# Patient Record
Sex: Male | Born: 1950 | Race: White | Hispanic: No | Smoking: Former smoker
Health system: Southern US, Community
[De-identification: ages and names within clinical notes are randomized; demographics above are authoritative.]

## PROBLEM LIST (undated history)

## (undated) DIAGNOSIS — K7030 Alcoholic cirrhosis of liver without ascites: Secondary | ICD-10-CM

## (undated) DIAGNOSIS — F1010 Alcohol abuse, uncomplicated: Secondary | ICD-10-CM

## (undated) DIAGNOSIS — I864 Gastric varices: Secondary | ICD-10-CM

## (undated) HISTORY — PX: OTHER SURGICAL HISTORY: SHX169

## (undated) SURGERY — COLONOSCOPY
Anesthesia: Monitor Anesthesia Care

---

## 2009-11-20 LAB — HM COLONOSCOPY: HM Colonoscopy: NEGATIVE

## 2011-09-30 ENCOUNTER — Emergency Department: Payer: Self-pay | Admitting: Emergency Medicine

## 2012-12-31 ENCOUNTER — Inpatient Hospital Stay: Payer: Self-pay | Admitting: Specialist

## 2012-12-31 DIAGNOSIS — R0602 Shortness of breath: Secondary | ICD-10-CM | POA: Diagnosis not present

## 2012-12-31 DIAGNOSIS — D72829 Elevated white blood cell count, unspecified: Secondary | ICD-10-CM | POA: Diagnosis not present

## 2012-12-31 DIAGNOSIS — J189 Pneumonia, unspecified organism: Secondary | ICD-10-CM | POA: Diagnosis not present

## 2012-12-31 DIAGNOSIS — F17200 Nicotine dependence, unspecified, uncomplicated: Secondary | ICD-10-CM | POA: Diagnosis not present

## 2012-12-31 DIAGNOSIS — K7460 Unspecified cirrhosis of liver: Secondary | ICD-10-CM | POA: Diagnosis not present

## 2012-12-31 DIAGNOSIS — R188 Other ascites: Secondary | ICD-10-CM | POA: Diagnosis not present

## 2012-12-31 DIAGNOSIS — K7030 Alcoholic cirrhosis of liver without ascites: Secondary | ICD-10-CM | POA: Diagnosis not present

## 2012-12-31 LAB — LIPASE, BLOOD: Lipase: 150 U/L (ref 73–393)

## 2012-12-31 LAB — CBC
HCT: 43.9 % (ref 40.0–52.0)
HGB: 14.6 g/dL (ref 13.0–18.0)
MCH: 34 pg (ref 26.0–34.0)
MCHC: 33.4 g/dL (ref 32.0–36.0)
Platelet: 233 10*3/uL (ref 150–440)
RBC: 4.31 10*6/uL — ABNORMAL LOW (ref 4.40–5.90)
WBC: 15.2 10*3/uL — ABNORMAL HIGH (ref 3.8–10.6)

## 2012-12-31 LAB — BASIC METABOLIC PANEL
Calcium, Total: 8.9 mg/dL (ref 8.5–10.1)
Creatinine: 1.05 mg/dL (ref 0.60–1.30)
EGFR (African American): >60
Glucose: 139 mg/dL — ABNORMAL HIGH (ref 65–99)
Osmolality: 277 (ref 275–301)
Sodium: 138 mmol/L (ref 136–145)

## 2012-12-31 LAB — HEPATIC FUNCTION PANEL A (ARMC)
Albumin: 2.9 g/dL — ABNORMAL LOW (ref 3.4–5.0)
Alkaline Phosphatase: 238 U/L — ABNORMAL HIGH (ref 50–136)
SGOT(AST): 116 U/L — ABNORMAL HIGH (ref 15–37)
SGPT (ALT): 52 U/L (ref 12–78)

## 2012-12-31 LAB — URINALYSIS, COMPLETE
Bilirubin,UR: NEGATIVE
Ketone: NEGATIVE
Leukocyte Esterase: NEGATIVE
Nitrite: NEGATIVE
Ph: 6 (ref 4.5–8.0)
Protein: NEGATIVE
Specific Gravity: 1.005 (ref 1.003–1.030)

## 2012-12-31 LAB — MAGNESIUM: Magnesium: 1.5 mg/dL — ABNORMAL LOW

## 2012-12-31 LAB — TROPONIN I: Troponin-I: <0.02 ng/mL

## 2012-12-31 LAB — PROTIME-INR: INR: 1.4

## 2013-01-01 DIAGNOSIS — K7030 Alcoholic cirrhosis of liver without ascites: Secondary | ICD-10-CM | POA: Diagnosis not present

## 2013-01-01 DIAGNOSIS — K709 Alcoholic liver disease, unspecified: Secondary | ICD-10-CM | POA: Diagnosis not present

## 2013-01-01 DIAGNOSIS — R188 Other ascites: Secondary | ICD-10-CM | POA: Diagnosis not present

## 2013-01-01 DIAGNOSIS — F17200 Nicotine dependence, unspecified, uncomplicated: Secondary | ICD-10-CM | POA: Diagnosis not present

## 2013-01-01 DIAGNOSIS — D72829 Elevated white blood cell count, unspecified: Secondary | ICD-10-CM | POA: Diagnosis not present

## 2013-01-01 LAB — APTT: Activated PTT: 33.4 secs (ref 23.6–35.9)

## 2013-01-01 LAB — PROTIME-INR
INR: 1.3
Prothrombin Time: 16.9 secs — ABNORMAL HIGH (ref 11.5–14.7)

## 2013-01-01 LAB — BODY FLUID CELL COUNT WITH DIFFERENTIAL
Eosinophil: 0 %
Lymphocytes: 50 %
Neutrophils: 17 %
Other Cells BF: 0 %

## 2013-01-01 LAB — LACTATE DEHYDROGENASE, PLEURAL OR PERITONEAL FLUID: LDH, Body Fluid: 57 U/L

## 2013-01-01 LAB — GLUCOSE, SEROUS FLUID: Glucose, Body Fluid: 122 mg/dL

## 2013-01-02 DIAGNOSIS — R188 Other ascites: Secondary | ICD-10-CM | POA: Diagnosis not present

## 2013-01-02 DIAGNOSIS — K7030 Alcoholic cirrhosis of liver without ascites: Secondary | ICD-10-CM | POA: Diagnosis not present

## 2013-01-02 DIAGNOSIS — F17200 Nicotine dependence, unspecified, uncomplicated: Secondary | ICD-10-CM | POA: Diagnosis not present

## 2013-01-02 DIAGNOSIS — K709 Alcoholic liver disease, unspecified: Secondary | ICD-10-CM | POA: Diagnosis not present

## 2013-01-02 DIAGNOSIS — D72829 Elevated white blood cell count, unspecified: Secondary | ICD-10-CM | POA: Diagnosis not present

## 2013-01-03 DIAGNOSIS — K709 Alcoholic liver disease, unspecified: Secondary | ICD-10-CM | POA: Diagnosis not present

## 2013-01-03 DIAGNOSIS — F17200 Nicotine dependence, unspecified, uncomplicated: Secondary | ICD-10-CM | POA: Diagnosis not present

## 2013-01-03 DIAGNOSIS — D72829 Elevated white blood cell count, unspecified: Secondary | ICD-10-CM | POA: Diagnosis not present

## 2013-01-03 DIAGNOSIS — R188 Other ascites: Secondary | ICD-10-CM | POA: Diagnosis not present

## 2013-01-03 DIAGNOSIS — K7030 Alcoholic cirrhosis of liver without ascites: Secondary | ICD-10-CM | POA: Diagnosis not present

## 2013-01-03 LAB — COMPREHENSIVE METABOLIC PANEL
Alkaline Phosphatase: 175 U/L — ABNORMAL HIGH (ref 50–136)
Anion Gap: 8 (ref 7–16)
BUN: 9 mg/dL (ref 7–18)
Bilirubin,Total: 2.1 mg/dL — ABNORMAL HIGH (ref 0.2–1.0)
Calcium, Total: 8.2 mg/dL — ABNORMAL LOW (ref 8.5–10.1)
Chloride: 104 mmol/L (ref 98–107)
Co2: 27 mmol/L (ref 21–32)
EGFR (African American): >60
Osmolality: 276 (ref 275–301)
Potassium: 3.2 mmol/L — ABNORMAL LOW (ref 3.5–5.1)
Sodium: 139 mmol/L (ref 136–145)
Total Protein: 6.9 g/dL (ref 6.4–8.2)

## 2013-01-03 LAB — AFP TUMOR MARKER: AFP-Tumor Marker: 3 ng/mL (ref 0.0–8.3)

## 2013-01-04 DIAGNOSIS — D72829 Elevated white blood cell count, unspecified: Secondary | ICD-10-CM | POA: Diagnosis not present

## 2013-01-04 DIAGNOSIS — F17200 Nicotine dependence, unspecified, uncomplicated: Secondary | ICD-10-CM | POA: Diagnosis not present

## 2013-01-04 DIAGNOSIS — K7030 Alcoholic cirrhosis of liver without ascites: Secondary | ICD-10-CM | POA: Diagnosis not present

## 2013-01-04 DIAGNOSIS — R188 Other ascites: Secondary | ICD-10-CM | POA: Diagnosis not present

## 2013-01-04 LAB — BASIC METABOLIC PANEL
BUN: 7 mg/dL (ref 7–18)
Calcium, Total: 8.1 mg/dL — ABNORMAL LOW (ref 8.5–10.1)
Co2: 29 mmol/L (ref 21–32)
Creatinine: 0.85 mg/dL (ref 0.60–1.30)
Osmolality: 275 (ref 275–301)
Potassium: 3.6 mmol/L (ref 3.5–5.1)
Sodium: 138 mmol/L (ref 136–145)

## 2013-01-04 LAB — MAGNESIUM: Magnesium: 1.6 mg/dL — ABNORMAL LOW

## 2013-01-05 LAB — BODY FLUID CULTURE

## 2013-01-06 LAB — CULTURE, BLOOD (SINGLE)

## 2013-04-10 ENCOUNTER — Ambulatory Visit: Payer: Self-pay | Admitting: Family Medicine

## 2013-04-10 DIAGNOSIS — M546 Pain in thoracic spine: Secondary | ICD-10-CM | POA: Diagnosis not present

## 2013-04-10 DIAGNOSIS — K709 Alcoholic liver disease, unspecified: Secondary | ICD-10-CM | POA: Diagnosis not present

## 2013-04-10 DIAGNOSIS — G8929 Other chronic pain: Secondary | ICD-10-CM | POA: Diagnosis not present

## 2013-04-10 DIAGNOSIS — M549 Dorsalgia, unspecified: Secondary | ICD-10-CM | POA: Diagnosis not present

## 2013-04-10 DIAGNOSIS — F17200 Nicotine dependence, unspecified, uncomplicated: Secondary | ICD-10-CM | POA: Diagnosis not present

## 2013-04-10 DIAGNOSIS — M5380 Other specified dorsopathies, site unspecified: Secondary | ICD-10-CM | POA: Diagnosis not present

## 2013-04-10 DIAGNOSIS — M79609 Pain in unspecified limb: Secondary | ICD-10-CM | POA: Diagnosis not present

## 2013-04-28 DIAGNOSIS — Z1211 Encounter for screening for malignant neoplasm of colon: Secondary | ICD-10-CM | POA: Diagnosis not present

## 2013-04-28 DIAGNOSIS — K7030 Alcoholic cirrhosis of liver without ascites: Secondary | ICD-10-CM | POA: Diagnosis not present

## 2013-05-13 DIAGNOSIS — K709 Alcoholic liver disease, unspecified: Secondary | ICD-10-CM | POA: Diagnosis not present

## 2013-05-13 DIAGNOSIS — M79609 Pain in unspecified limb: Secondary | ICD-10-CM | POA: Diagnosis not present

## 2013-05-13 DIAGNOSIS — M549 Dorsalgia, unspecified: Secondary | ICD-10-CM | POA: Diagnosis not present

## 2013-05-13 DIAGNOSIS — F17200 Nicotine dependence, unspecified, uncomplicated: Secondary | ICD-10-CM | POA: Diagnosis not present

## 2013-07-04 DIAGNOSIS — G8929 Other chronic pain: Secondary | ICD-10-CM | POA: Diagnosis not present

## 2013-07-04 DIAGNOSIS — M549 Dorsalgia, unspecified: Secondary | ICD-10-CM | POA: Diagnosis not present

## 2013-07-04 DIAGNOSIS — M79609 Pain in unspecified limb: Secondary | ICD-10-CM | POA: Diagnosis not present

## 2013-07-04 DIAGNOSIS — K709 Alcoholic liver disease, unspecified: Secondary | ICD-10-CM | POA: Diagnosis not present

## 2013-08-13 ENCOUNTER — Ambulatory Visit: Payer: Self-pay | Admitting: Pain Medicine

## 2013-08-13 DIAGNOSIS — J438 Other emphysema: Secondary | ICD-10-CM | POA: Diagnosis not present

## 2013-08-13 DIAGNOSIS — M533 Sacrococcygeal disorders, not elsewhere classified: Secondary | ICD-10-CM | POA: Diagnosis not present

## 2013-08-13 DIAGNOSIS — F1021 Alcohol dependence, in remission: Secondary | ICD-10-CM | POA: Diagnosis not present

## 2013-08-13 DIAGNOSIS — M47817 Spondylosis without myelopathy or radiculopathy, lumbosacral region: Secondary | ICD-10-CM | POA: Diagnosis not present

## 2013-08-13 DIAGNOSIS — IMO0002 Reserved for concepts with insufficient information to code with codable children: Secondary | ICD-10-CM | POA: Diagnosis not present

## 2013-08-13 DIAGNOSIS — Z6379 Other stressful life events affecting family and household: Secondary | ICD-10-CM | POA: Diagnosis not present

## 2013-08-13 DIAGNOSIS — F17200 Nicotine dependence, unspecified, uncomplicated: Secondary | ICD-10-CM | POA: Diagnosis not present

## 2013-08-13 DIAGNOSIS — M169 Osteoarthritis of hip, unspecified: Secondary | ICD-10-CM | POA: Diagnosis not present

## 2013-08-13 DIAGNOSIS — M67919 Unspecified disorder of synovium and tendon, unspecified shoulder: Secondary | ICD-10-CM | POA: Diagnosis not present

## 2013-08-13 DIAGNOSIS — Z8709 Personal history of other diseases of the respiratory system: Secondary | ICD-10-CM | POA: Diagnosis not present

## 2013-08-13 DIAGNOSIS — M19019 Primary osteoarthritis, unspecified shoulder: Secondary | ICD-10-CM | POA: Diagnosis not present

## 2013-08-13 DIAGNOSIS — G894 Chronic pain syndrome: Secondary | ICD-10-CM | POA: Diagnosis not present

## 2013-08-13 DIAGNOSIS — Z79899 Other long term (current) drug therapy: Secondary | ICD-10-CM | POA: Diagnosis not present

## 2013-08-22 DIAGNOSIS — Z23 Encounter for immunization: Secondary | ICD-10-CM | POA: Diagnosis not present

## 2013-08-25 ENCOUNTER — Other Ambulatory Visit: Payer: Self-pay

## 2013-08-25 ENCOUNTER — Ambulatory Visit: Payer: Self-pay | Admitting: Pain Medicine

## 2013-08-25 DIAGNOSIS — Z135 Encounter for screening for eye and ear disorders: Secondary | ICD-10-CM | POA: Diagnosis not present

## 2013-08-25 DIAGNOSIS — M533 Sacrococcygeal disorders, not elsewhere classified: Secondary | ICD-10-CM | POA: Diagnosis not present

## 2013-08-25 DIAGNOSIS — K709 Alcoholic liver disease, unspecified: Secondary | ICD-10-CM | POA: Diagnosis not present

## 2013-08-25 DIAGNOSIS — M545 Low back pain: Secondary | ICD-10-CM | POA: Diagnosis not present

## 2013-08-25 DIAGNOSIS — M47817 Spondylosis without myelopathy or radiculopathy, lumbosacral region: Secondary | ICD-10-CM | POA: Diagnosis not present

## 2013-08-25 DIAGNOSIS — M25559 Pain in unspecified hip: Secondary | ICD-10-CM | POA: Diagnosis not present

## 2013-08-25 LAB — COMPREHENSIVE METABOLIC PANEL
Albumin: 3.4 g/dL (ref 3.4–5.0)
Anion Gap: 3 — ABNORMAL LOW (ref 7–16)
Bilirubin,Total: 0.5 mg/dL (ref 0.2–1.0)
Calcium, Total: 9 mg/dL (ref 8.5–10.1)
Chloride: 109 mmol/L — ABNORMAL HIGH (ref 98–107)
EGFR (Non-African Amer.): >60
Osmolality: 280 (ref 275–301)
Sodium: 139 mmol/L (ref 136–145)
Total Protein: 7 g/dL (ref 6.4–8.2)

## 2013-08-27 ENCOUNTER — Ambulatory Visit: Payer: Self-pay | Admitting: Pain Medicine

## 2013-08-27 DIAGNOSIS — S43499A Other sprain of unspecified shoulder joint, initial encounter: Secondary | ICD-10-CM | POA: Diagnosis not present

## 2013-08-27 DIAGNOSIS — T148XXA Other injury of unspecified body region, initial encounter: Secondary | ICD-10-CM | POA: Diagnosis not present

## 2013-08-27 DIAGNOSIS — M25519 Pain in unspecified shoulder: Secondary | ICD-10-CM | POA: Diagnosis not present

## 2013-08-27 DIAGNOSIS — S46919A Strain of unspecified muscle, fascia and tendon at shoulder and upper arm level, unspecified arm, initial encounter: Secondary | ICD-10-CM | POA: Diagnosis not present

## 2013-08-27 DIAGNOSIS — S46819A Strain of other muscles, fascia and tendons at shoulder and upper arm level, unspecified arm, initial encounter: Secondary | ICD-10-CM | POA: Diagnosis not present

## 2013-08-27 DIAGNOSIS — M545 Low back pain, unspecified: Secondary | ICD-10-CM | POA: Diagnosis not present

## 2013-09-05 DIAGNOSIS — S43439A Superior glenoid labrum lesion of unspecified shoulder, initial encounter: Secondary | ICD-10-CM | POA: Diagnosis not present

## 2013-09-05 DIAGNOSIS — M66329 Spontaneous rupture of flexor tendons, unspecified upper arm: Secondary | ICD-10-CM | POA: Diagnosis not present

## 2014-07-11 ENCOUNTER — Emergency Department: Payer: Self-pay | Admitting: Emergency Medicine

## 2014-07-11 DIAGNOSIS — S199XXA Unspecified injury of neck, initial encounter: Secondary | ICD-10-CM | POA: Diagnosis not present

## 2014-07-11 DIAGNOSIS — M545 Low back pain, unspecified: Secondary | ICD-10-CM | POA: Diagnosis not present

## 2014-07-11 DIAGNOSIS — S0993XA Unspecified injury of face, initial encounter: Secondary | ICD-10-CM | POA: Diagnosis not present

## 2014-07-11 DIAGNOSIS — M542 Cervicalgia: Secondary | ICD-10-CM | POA: Diagnosis not present

## 2014-07-11 DIAGNOSIS — IMO0002 Reserved for concepts with insufficient information to code with codable children: Secondary | ICD-10-CM | POA: Diagnosis not present

## 2014-07-19 ENCOUNTER — Emergency Department: Payer: Self-pay | Admitting: Emergency Medicine

## 2014-07-19 DIAGNOSIS — S0003XA Contusion of scalp, initial encounter: Secondary | ICD-10-CM | POA: Diagnosis not present

## 2014-07-19 DIAGNOSIS — S59909A Unspecified injury of unspecified elbow, initial encounter: Secondary | ICD-10-CM | POA: Diagnosis not present

## 2014-07-19 DIAGNOSIS — Z23 Encounter for immunization: Secondary | ICD-10-CM | POA: Diagnosis not present

## 2014-07-19 DIAGNOSIS — S199XXA Unspecified injury of neck, initial encounter: Secondary | ICD-10-CM | POA: Diagnosis not present

## 2014-07-19 DIAGNOSIS — IMO0002 Reserved for concepts with insufficient information to code with codable children: Secondary | ICD-10-CM | POA: Diagnosis not present

## 2014-07-19 DIAGNOSIS — S0993XA Unspecified injury of face, initial encounter: Secondary | ICD-10-CM | POA: Diagnosis not present

## 2014-07-19 DIAGNOSIS — S0990XA Unspecified injury of head, initial encounter: Secondary | ICD-10-CM | POA: Diagnosis not present

## 2014-07-19 DIAGNOSIS — R404 Transient alteration of awareness: Secondary | ICD-10-CM | POA: Diagnosis not present

## 2014-07-19 DIAGNOSIS — F17200 Nicotine dependence, unspecified, uncomplicated: Secondary | ICD-10-CM | POA: Diagnosis not present

## 2014-07-19 DIAGNOSIS — S1093XA Contusion of unspecified part of neck, initial encounter: Secondary | ICD-10-CM | POA: Diagnosis not present

## 2014-12-29 DIAGNOSIS — Z720 Tobacco use: Secondary | ICD-10-CM | POA: Diagnosis not present

## 2014-12-29 DIAGNOSIS — G8929 Other chronic pain: Secondary | ICD-10-CM | POA: Diagnosis not present

## 2014-12-29 DIAGNOSIS — M79604 Pain in right leg: Secondary | ICD-10-CM | POA: Diagnosis not present

## 2014-12-29 DIAGNOSIS — M25559 Pain in unspecified hip: Secondary | ICD-10-CM | POA: Diagnosis not present

## 2014-12-29 DIAGNOSIS — M549 Dorsalgia, unspecified: Secondary | ICD-10-CM | POA: Diagnosis not present

## 2014-12-29 DIAGNOSIS — R011 Cardiac murmur, unspecified: Secondary | ICD-10-CM | POA: Diagnosis not present

## 2014-12-29 DIAGNOSIS — K709 Alcoholic liver disease, unspecified: Secondary | ICD-10-CM | POA: Diagnosis not present

## 2014-12-29 DIAGNOSIS — Z23 Encounter for immunization: Secondary | ICD-10-CM | POA: Diagnosis not present

## 2014-12-29 DIAGNOSIS — Z0000 Encounter for general adult medical examination without abnormal findings: Secondary | ICD-10-CM | POA: Diagnosis not present

## 2015-01-04 ENCOUNTER — Telehealth: Payer: Self-pay | Admitting: *Deleted

## 2015-01-04 NOTE — Telephone Encounter (Signed)
lmov to for pt to set up apt, refferal from dr Luan Pulling.notes on file,hm

## 2015-02-09 DIAGNOSIS — N402 Nodular prostate without lower urinary tract symptoms: Secondary | ICD-10-CM | POA: Diagnosis not present

## 2015-02-09 DIAGNOSIS — K709 Alcoholic liver disease, unspecified: Secondary | ICD-10-CM | POA: Diagnosis not present

## 2015-02-09 DIAGNOSIS — L308 Other specified dermatitis: Secondary | ICD-10-CM | POA: Diagnosis not present

## 2015-02-09 DIAGNOSIS — R739 Hyperglycemia, unspecified: Secondary | ICD-10-CM | POA: Diagnosis not present

## 2015-02-10 LAB — LIPID PANEL
CHOLESTEROL: 207 mg/dL — AB (ref 0–200)
HDL: 104 mg/dL — AB (ref 35–70)
LDL Cholesterol: 93 mg/dL
Triglycerides: 52 mg/dL (ref 40–160)

## 2015-02-10 LAB — BASIC METABOLIC PANEL
BUN: 13 mg/dL (ref 4–21)
Creatinine: 1 mg/dL (ref <=1.3)
GLUCOSE: 117 mg/dL
Potassium: 3.7 mmol/L (ref 3.4–5.3)
Sodium: 141 mmol/L (ref 137–147)

## 2015-02-10 LAB — PSA: PSA: 4.4

## 2015-02-10 LAB — HEPATIC FUNCTION PANEL
ALK PHOS: 208 U/L — AB (ref 25–125)
ALT: 46 U/L — AB (ref 10–40)
AST: 88 U/L — AB (ref 14–40)
BILIRUBIN, TOTAL: 1.5 mg/dL

## 2015-02-10 LAB — HEMOGLOBIN A1C: Hgb A1c MFr Bld: 5.3 % (ref 4.0–6.0)

## 2015-02-10 LAB — CBC AND DIFFERENTIAL
HEMATOCRIT: 42 % (ref 41–53)
Hemoglobin: 14.4 g/dL (ref 13.5–17.5)
Neutrophils Absolute: 5 /uL
PLATELETS: 143 10*3/uL — AB (ref 150–399)
WBC: 7.8 10*3/mL

## 2015-02-12 DIAGNOSIS — M79604 Pain in right leg: Secondary | ICD-10-CM | POA: Diagnosis not present

## 2015-02-12 DIAGNOSIS — R972 Elevated prostate specific antigen [PSA]: Secondary | ICD-10-CM | POA: Diagnosis not present

## 2015-02-12 DIAGNOSIS — M549 Dorsalgia, unspecified: Secondary | ICD-10-CM | POA: Diagnosis not present

## 2015-02-12 DIAGNOSIS — I10 Essential (primary) hypertension: Secondary | ICD-10-CM | POA: Diagnosis not present

## 2015-02-12 DIAGNOSIS — K709 Alcoholic liver disease, unspecified: Secondary | ICD-10-CM | POA: Diagnosis not present

## 2015-02-12 DIAGNOSIS — G8929 Other chronic pain: Secondary | ICD-10-CM | POA: Diagnosis not present

## 2015-03-12 NOTE — Consult Note (Signed)
Chief Complaint:  Subjective/Chief Complaint Overall better. Still draining some from paracentesis site.   VITAL SIGNS/ANCILLARY NOTES: **Vital Signs.:   14-Feb-14 05:38  Vital Signs Type Routine  Temperature Temperature (F) 98.5  Celsius 36.9  Temperature Source oral  Pulse Pulse 79  Respirations Respirations 17  Systolic BP Systolic BP 403  Diastolic BP (mmHg) Diastolic BP (mmHg) 71  Mean BP 88  Pulse Ox % Pulse Ox % 96  Pulse Ox Activity Level  At rest  Oxygen Delivery 2L   Brief Assessment:  Additional Physical Exam Abdomen is distended but otherwise benign.   Lab Results:  Hepatic:  14-Feb-14 04:25   Bilirubin, Total  2.1  Alkaline Phosphatase  175  SGPT (ALT) 41  SGOT (AST)  84  Total Protein, Serum 6.9  Albumin, Serum  2.4  Routine Chem:  14-Feb-14 04:25   Glucose, Serum 86  BUN 9  Creatinine (comp) 0.85  Sodium, Serum 139  Potassium, Serum  3.2  Chloride, Serum 104  CO2, Serum 27  Calcium (Total), Serum  8.2  Osmolality (calc) 276  eGFR (African American) >60  eGFR (Non-African American) >60 (eGFR values <17m/min/1.73 m2 may be an indication of chronic kidney disease (CKD). Calculated eGFR is useful in patients with stable renal function. The eGFR calculation will not be reliable in acutely ill patients when serum creatinine is changing rapidly. It is not useful in  patients on dialysis. The eGFR calculation may not be applicable to patients at the low and high extremes of body sizes, pregnant women, and vegetarians.)  Anion Gap 8   Assessment/Plan:  Assessment/Plan:  Assessment Cirrhosis of liver with tense ascites. Hepatitis panel and AFP are negative.   Plan Repeat paracentesis today. May go home tomorrow on PO lasix and aldactone. Follow up with me in 2 weeks (order written). Will sign off. Please call me if needed. Thanks.   Electronic Signatures: IJill Side(MD)  (Signed 14-Feb-14 11:05)  Authored: Chief Complaint, VITAL  SIGNS/ANCILLARY NOTES, Brief Assessment, Lab Results, Assessment/Plan   Last Updated: 14-Feb-14 11:05 by IJill Side(MD)

## 2015-03-12 NOTE — Consult Note (Signed)
PATIENT NAME:  Craig Maldonado, Craig Maldonado MR#:  [id number] DATE OF BIRTH:  10-04-1951  DATE OF CONSULTATION:  01/01/2013  REFERRING PHYSICIAN:  Fritzi Mandes, MD CONSULTING PHYSICIAN:  Jill Side, MD  PRIMARY CARE PHYSICIAN: Benita Stabile, MD  REASON FOR CONSULTATION: Tense ascites.   HISTORY OF PRESENT ILLNESS: This is a 64 year old Caucasian male with history of chronic acid reflux and chronic back pain. According to the patient, for the last 10 days he has been noticing decreasing urination and increasing scrotal swelling as well as abdominal distention. He came to the Emergency Room yesterday with significant abdominal distention and discomfort because of increasing abdominal girth.  CT scan of the abdomen and pelvis showed moderate amount of ascites and an abnormal-looking liver consistent with cirrhosis of liver. He denies any history of known liver disease or hepatitis in the past. The patient is a drinker and drinks on a daily basis. He denies any nausea or vomiting, denies any diarrhea or constipation. According to him, he has never had ascites, GI bleed, mental confusion or any other signs of liver disease in the past.   PAST MEDICAL HISTORY: As above.   PAST SURGICAL HISTORY: Right leg surgery for compound fracture of tibia and fibula.   ALLERGIES: No known drug allergies.   HOME MEDICATIONS: None.  FAMILY HISTORY: Positive for hypertension.   SOCIAL HISTORY: He drinks about a pint of alcohol mixed with vodka in orange juice every day. He smokes about 1/2 pack of cigarettes a day.   REVIEW OF SYSTEMS: Negative except for what is mentioned in the history of present illness.   PHYSICAL EXAMINATION: GENERAL: Obese male. Does not appear to be in any acute distress.  VITAL SIGNS: Temperature is 97.8, pulse 79, respirations 18 to 20 and blood pressure 112/79.  HEENT: Examination is unremarkable. Clinically appears to be mildly jaundiced.  NECK: Veins are flat.  PULMONARY: Lungs are clear to  auscultation bilaterally with fair air entry and no added sounds.  CARDIOVASCULAR: Regular rate and rhythm. No gallops or murmur.  ABDOMEN: Distended and tense abdomen. Positive shifting dullness. Liver and spleen are not palpable.  EXTREMITIES: Examination did not show any significant edema.  NEUROLOGIC: Examination appears to be unremarkable.   LABORATORY DATA: INR is 1.4. White cell count is 15,000, hemoglobin 14.6, hematocrit 43.9 and platelet count of 233. Serum lipase is 150. Troponin is less than 0.02. Electrolytes: BUN and creatinine normal. Total bilirubin is 3.8, direct bilirubin is 2, alkaline phosphatase is 238, ALT is normal at 52, AST is elevated at 116 and serum albumin is low at 2.9.   RADIOLOGIC DATA: CT scan of the abdomen as above.   ASSESSMENT AND PLAN: The patient is with what appears to be portal hypertension with ascites. The most likely underlying etiology is cirrhosis of liver although alcoholic hepatitis with secondary portal hypertension without significant cirrhosis remains a possibility. I agree with ultrasound-guided paracentesis for symptomatic improvement. Agree with Lasix and Aldactone. Sodium restricted diet. I will obtain acute hepatitis panel as well as alpha-fetoprotein. Will also obtain an ultrasound with Doppler to rule out any vascular pathology. Further recommendations to follow.  ____________________________ Jill Side, MD si:sb D: 01/01/2013 09:54:43 ET T: 01/01/2013 10:10:06 ET JOB#: [id number]  cc: Jill Side, MD, <Dictator> Leona Carry. Hall Busing, MD Jill Side MD ELECTRONICALLY SIGNED 01/03/2013 11:10

## 2015-03-12 NOTE — H&P (Signed)
PATIENT NAMEKIE, Craig Maldonado MR#:  [id number] DATE OF BIRTH:  03-May-1951  DATE OF ADMISSION:  12/31/2012  PRIMARY CARE PHYSICIAN:  Dr. Benita Stabile.  CHIEF COMPLAINT: Abdominal distention for about a week to 10 days.   HISTORY OF PRESENT ILLNESS:   The patient is a pleasant 64 year old Caucasian gentleman with past medical history of acid reflux and back pain. Comes to the Emergency Room accompanied by his wife with complaints of abdominal distention over the past week to 10 days. The patient said he had flulike symptoms about 3 to 4 weeks ago; thereafter, he has had diarrheal stools, had episodes of 6 to 10 times a day for the last 10 days. Denies any fever, nausea or vomiting. Thereafter, he started noticing abdominal distension to the point it became very tense, tight and is unable to get around without difficulty and shortness of breath. Denies any productive cough.   In the Emergency Room, the patient was found to have tense ascites. CT of the abdomen showed cirrhosis of liver with abnormal LFTs and history of alcohol abuse suggestive of likely new onset cirrhosis of liver, alcohol abuse. He is being admitted for further evaluation and management.   PAST MEDICAL HISTORY:   1.  GERD/acid reflux.  2.  Back pain.   PAST SURGICAL HISTORY:  Right leg surgery for compound fracture tibia-fibula x 5.   ALLERGIES TO MEDICATIONS:  None.  HOME MEDICATIONS: None.    FAMILY HISTORY: Positive for hypertension.   SOCIAL HISTORY: Lives at home with wife. Drinks about a pint of alcohol mixed with vodka with orange juice every day for the last 6 to 7 months. The patient also smokes about 1/2 pack a day of cigarettes for the last 40 years. Denies any other drug use.     REVIEW OF SYSTEMS:   CONSTITUTIONAL: Positive for fatigue, weakness.  EYES: No blurred or double vision or cataracts.  EAR, NOSE, THROAT: No tinnitus, ear pain, hearing loss or difficulty swallowing.  RESPIRATORY: No cough or wheeze.   Positive for shortness of breath.  CARDIOVASCULAR: No chest pain, orthopnea, edema or hypertension.  GASTROINTESTINAL: No nausea, vomiting, diarrhea. Positive for abdominal distention. Positive for GERD. Denies melena. Positive for diarrhea.  GENITOURINARY: No dysuria or hematuria.  ENDOCRINE: No polyuria or nocturia.  HEMATOLOGY: No anemia or easy bruising.  SKIN: No acne or rash.  MUSCULOSKELETAL: Positive for back pain.  NEUROLOGIC:  No CVA or TIAs.  PSYCHIATRIC: No anxiety or depression. All other systems reviewed and negative.   PHYSICAL EXAMINATION: GENERAL: The patient is awake, alert, oriented x 3, not in acute distress.  VITAL SIGNS: Afebrile. Pulse is 96, respirations 22, blood pressure 121/84, sats are 94% on 2 liters.  HEENT: Atraumatic, normocephalic. Pupils: ERRLA. EOM intact. Oral mucosa is dry.  NECK: Supple. No JVD. No carotid bruit.  RESPIRATORY: Decreased breath sounds in the bases. No respiratory distress, wheezing, crackles or rhonchi heard. No use of accessory muscles.  CARDIOVASCULAR: Both the heart sounds are normal, tachycardia present. No murmur heard. PMI not lateralized. Chest nontender.  EXTREMITIES: Good pedal pulses, good femoral pulses, 1+ pitting edema at the ankle joint. ABDOMEN:  Obese, distended with tense ascites, fluid thrill present. Organomegaly not appreciated secondary to tense ascites.  NEUROLOGIC: Grossly intact cranial nerves II through XII. No motor or sensory deficits.  PSYCHIATRIC:  The patient is awake, alert, oriented x 3. No tremors or anxiety noted.  ECHOCARDIOGRAM:  Shows sinus tachycardia.   LABORATORY DATA:  Cardiac enzymes:  First set negative. Magnesium is 1.5. Lipase is 150. Albumin 2.9. Alk phos is 238, SGOT is 116, total bilirubin is 2, direct total bilirubin is 3.8. Glucose is 139. The rest of the chemistry is normal.   RADIOGRAPHIC STUDIES:  Chest x-ray: Atelectasis and/or pneumonia right lung base. CT of the abdomen and pelvis  shows findings suggestive of possible underlying cirrhosis with significant abnormal appearance of liver without definite masses. There is severely heterogenous pattern, attenuation and enhancement, especially in the upper right lobe. There is moderate large volume of ascites, right lung base atelectasis. Infiltrate is felt to be less likely. The urinary bladder wall is thickened. Correlate with urinalysis.   URINALYSIS:  Negative for urinary tract infection.   ASSESSMENT AND PLAN:  A 64 year old patient with history of acid reflux and back pain, comes in with:  1.  Progressive increasing abdominal distention, appears to be due to tense ascites in the setting of cirrhosis, likely alcohol induced. The patient is going to be admitted on surgical floor. We will give 2 gram sodium diet, start the patient on Lasix and spironolactone.  We will give vitamin K intramuscular x 3 doses 10 mg, and have interventional radiology do ultrasound-guided paracentesis. Send fluid for pH, amylase, LDH, protein, cytology and differential. Dr. Dionne Milo will see the patient. The case was discussed with him. The patient was advised on alcohol abstinence.  2.  Leukocytosis, appears reactive. Chest x-ray shows there is a possible atelectasis versus infiltrate right lower lobe. However, the patient is asymptomatic. He has some shortness of breath, which is likely I am assuming because of his COPD due to smoking and large volume ascites pushing his diaphragm up and causing subjective shortness of breath. I will hold off on any antibiotics at this time. If the patient does not improve, then consider a round of antibiotics.  3.  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 We will continue to monitor counts.  4.  Hypomagnesemia. We will replace with oral magnesium.  5.  Deep vein thrombosis prophylaxis. The patient already has elevated PT/INR. I will give SCDs and  TEDs.  6.  Chronic alcohol abuse. The patient is recommended on abstinence. He is agreeable to it. We will give multivitamin, folate and thiamine. We will give IV Ativan for possible withdrawal.  7.  Chronic tobacco abuse. The patient is also advised on smoking cessation. About 3 minutes spent in counseling smoking cessation.   Further workup according to the patient's clinical course. Hospital admission plan was discussed   with the patient and the patient's wife, who is agreeable to it.   TIME SPENT: 50 minutes.   ____________________________ Hart Rochester Posey Pronto, MD sap:dm D: 12/31/2012 19:02:53 ET T: 12/31/2012 20:14:44 ET JOB#: [id number]  cc: Lurena Naeve A. Posey Pronto, MD, <Dictator> Leona Carry. Hall Busing, MD Ilda Basset MD ELECTRONICALLY SIGNED 12/31/2012 21:15

## 2015-03-12 NOTE — Discharge Summary (Signed)
PATIENT NAME:  Craig Maldonado, Craig Maldonado MR#:  [id number] DATE OF BIRTH:  1951/10/26  DATE OF ADMISSION:  12/31/2012 DATE OF DISCHARGE:  01/04/2013  For a detailed note, please take a look at the history and physical done on admission by Dr. Fritzi Mandes.   DIAGNOSES AT DISCHARGE: 1.  Acute abdominal ascites secondary to chronic liver disease.  2.  Chronic liver disease secondary to alcohol abuse.  3.  Chronic obstructive pulmonary disease.  4.  Tobacco abuse.   DIET:  The patient is being discharged on a low-sodium diet.   ACTIVITY:  As tolerated.   FOLLOW-UP:  With Dr. Dionne Milo in the next 1 to 2 weeks.   DISCHARGE MEDICATIONS:  Aldactone 50 mg daily, Lasix 40 mg daily, magnesium oxide 400 mg daily and tramadol 50 mg 4 times a day as needed for pain.   CONSULTANTS DURING THE HOSPITAL COURSE:  Dr. Dionne Milo from gastroenterology.   PERTINENT STUDIES DONE DURING THE HOSPITAL COURSE:  CT scan of the abdomen and pelvis done with contrast on admission showing findings suggestive of possible underlying cirrhosis with significant abnormal appearance of the liver without definite mass.  Moderate to large volume of ascites.  A chest x-ray done showing atelectasis and/or pneumonia.   An ultrasound-guided paracentesis done x 2 with removal of a total of 6 liters of fluid, alpha-fetoprotein level which is still pending.  An acute hepatitis profile, which was negative.   HOSPITAL COURSE:  This is a 64 year old male with medical problems as mentioned above, presented to the hospital on 12/31/2012 secondary to abdominal distention.  1.  Acute abdominal ascites.  This was likely secondary to chronic liver disease and alcohol abuse.  The patient's CT scan of the abdomen and pelvis confirmed cirrhosis.  He has a history of heavy alcohol abuse.  He has never had abdominal ascites before and had progressively come around in the past 10 days.  The patient underwent an ultrasound-guided paracentesis twice while in the  hospital, had 6 liters of fluid removed.  His fluid studies were consistent with noninfectious fluid with no evidence of any spontaneous bacterial peritonitis.  The patient's distention and shortness of breath and pain has significantly improved since then.  He is presently being discharged on diuretics as mentioned with close follow-up with GI as an outpatient.  2.  Shortness of breath.  Initially there was some thought that patient may have had a pneumonia.  And therefore he was started on antibiotics, but he remained afebrile.  His x-ray did not show any evidence of pneumonia, but more atelectasis.  The likely cause of patient's shortness of breath was probably due to underlying COPD complicated with significant abdominal ascites pushing up on his diaphragm.  After having large volume paracentesis the patient's shortness of breath has significantly improved.  He is currently afebrile.  His white cell count is normal and therefore he is being discharged on no antibiotics.  He is also strongly advised to refrain from smoking as mentioned.  3.  Acquired coagulopathy.  This is secondary to chronic liver disease.  This can further be followed.  He had no evidence of acute bleeding while in the hospital.  4.  Hypomagnesemia.  He is apparently presently being discharged on by mouth magnesium supplements.  5.  Tobacco abuse.  The patient was maintained on nicotine patch while in the hospital and strongly advised to refrain from smoking.   CODE STATUS:  THE PATIENT IS A FULL CODE.   TIME SPENT:  40  minutes.     ____________________________ Belia Heman. Verdell Carmine, MD vjs:ea D: 01/04/2013 14:32:00 ET T: 01/05/2013 06:43:09 ET JOB#: [id number]  cc: Belia Heman. Verdell Carmine, MD, <Dictator> Dr. Marton Redwood MD ELECTRONICALLY SIGNED 01/05/2013 20:55

## 2015-03-12 NOTE — Consult Note (Signed)
Chief Complaint:  Subjective/Chief Complaint Feels better after paracentesis. Fiar urine output.   VITAL SIGNS/ANCILLARY NOTES: **Vital Signs.:   13-Feb-14 05:09  Vital Signs Type Routine  Temperature Temperature (F) 98.1  Celsius 36.7  Temperature Source oral  Pulse Pulse 80  Respirations Respirations 17  Systolic BP Systolic BP 510  Diastolic BP (mmHg) Diastolic BP (mmHg) 74  Mean BP 87  Pulse Ox % Pulse Ox % 96  Pulse Ox Activity Level  At rest  Oxygen Delivery 2L   Brief Assessment:  Additional Physical Exam Abdomen is stikll distended but less than before.   Lab Results: BF Analysis:  12-Feb-14 11:19   Protein, BF 2.3  Body Fluid Source ABDOMINAL FLUID  Result(s) reported on 01 Jan 2013 at 01:12PM.  Albumin, Body Fluid 1.1 (Result(s) reported on 01 Jan 2013 at 01:12PM.)  Glucose, BF 122 (Result(s) reported on 01 Jan 2013 at 01:12PM.)  LDH, BF 57 (Result(s) reported on 01 Jan 2013 at 01:12PM.)  Body Fluid Source (CC) PARACENTESIS FLUID  Color - (BF) LIGHT YELLOW  Clarity (BF) CLEAR  NCC  (nucleated cell count) 365  Neutrophils  (BF) 17  Lymphocytes  (BF) 50  Monocytes/Macrophages  (BF) 33  Eosinophil  (BF) 0  Basophil  (BF) 0  Other Cells  (BF) 0 (Result(s) reported on 01 Jan 2013 at 01:33PM.)  Oncology:  12-Feb-14 14:22   AFP, Tumor Marker (Serial Monitoring) 3.0 ([facility] methodology            [facility]            No: [id number]           [address], [place], [place] [address]           Lindon Romp, MD         [phone] Result(s) reported on 02 Jan 2013 at 06:47AM.)  Routine Micro:  12-Feb-14 11:19   Micro Text Report BODY FLUID CULTURE   COMMENT                   NO GROWTH IN 18-24 HOURS   GRAM STAIN                MANY WHITE BLOOD CELLS   GRAM STAIN                NO ORGANISMS SEEN   GRAM STAIN                GROSSLY BLOODY   ANTIBIOTIC                       Specimen Source ASCITES FLUID  Culture Comment NO GROWTH IN  18-24 HOURS  Gram Stain 1 MANY WHITE BLOOD CELLS  Gram Stain 2 NO ORGANISMS SEEN  Gram Stain 3 GROSSLY BLOODY  Result(s) reported on 02 Jan 2013 at 10:55AM.  General Ref:  12-Feb-14 14:22   Acute Hepatitis Profile ========== TEST NAME ==========  ========= RESULTS =========  = REFERENCE RANGE =  HEPATITIS PANEL, ACUTE   Routine Coag:  12-Feb-14 04:17   Activated PTT (APTT) 33.4 (A HCT value >55% may artifactually increase the APTT. In one study, the increase was an average of 19%. Reference: "Effect on Routine and Special Coagulation Testing Values of Citrate Anticoagulant Adjustment in Patients with High HCT Values." American Journal of Clinical Pathology 2006;126:400-405.)  Prothrombin  16.9  INR 1.3 (INR reference interval applies to patients on anticoagulant therapy. A single INR therapeutic range  for coumarins is not optimal for all indications; however, the suggested range for most indications is 2.0 - 3.0. Exceptions to the INR Reference Range may include: Prosthetic heart valves, acute myocardial infarction, prevention of myocardial infarction, and combinations of aspirin and anticoagulant. The need for a higher or lower target INR must be assessed individually. Reference: The Pharmacology and Management of the Vitamin K  antagonists: the seventh ACCP Conference on Antithrombotic and Thrombolytic Therapy. OEUMP.5361 Sept:126 (3suppl): [id number]. A HCT value >55% may artifactually increase the PT.  In one study,  the increase was an average of 25%. Reference:  "Effect on Routine and Special Coagulation Testing Values of Citrate Anticoagulant Adjustment in Patients with High HCT Values." American Journal of Clinical Pathology 2006;126:400-405.)   Radiology Results: [place]:    12-Feb-14 11:08, US Abdomen Limited Survey  US Abdomen Limited Survey   REASON FOR EXAM:    portal HTN  COMMENTS:   Body Site: Right Upper Quad    PROCEDURE: [place]  - US ABDOMEN LIMITED SURVEY  - Jan 01 2013 11:08AM     RESULT:  Limited right upper quadrant abdominal sonogram is performed.   The pancreas could not be visualized. The liver shows increased   echotexture and is small and nodular consistent with underlying   cirrhosis. The a hepatic vein is visualized and may represent the right   hepatic vein. This appears to be unremarkable. Hepatic arterial waveform   appears normal. The portal vein was not visualized and therefore portal   venous flow could not be assessed. Gallbladder wall is thickened to 3.9   mm. There is a negative sonographic Murphy's sign. The common bile duct   could not be visualized.  IMPRESSION:  The portal venous flow could not be evaluated as the main   portal vein could not be visualized. The common bile duct was not   visualized. Ascites is present. Changes are present cirrhosis with   increased echotexture, decreased size and nodular appearance of the liver   being demonstrated. There is hepatic arterial flow with a normal waveform   documented. Hepatic venous flow appears to be present and what is likely   the right hepatic vein.    Dictation Site: 2    Addendum: After the patient underwent paracentesis additional images were   performed to attempt to better visualize the portal vein. The additional   images show visualization of flow in the portal vein toward the liver but   the spectral Doppler is nondiagnostic.    Verified By: Sundra Aland, M.D., MD   Assessment/Plan:  Assessment/Plan:  Assessment Cirrhosis most likely secondary to ETOH. [place] with patent vasculature. Tense ascites s/p 2000 ml removal yesterday. ETOH.   Plan Continue sodium restrictive diet and diuretics. Will follow.   Electronic Signatures: Jill Side (MD)  (Signed 13-Feb-14 14:19)  Authored: Chief Complaint, VITAL SIGNS/ANCILLARY NOTES, Brief Assessment, Lab Results, Radiology Results, Assessment/Plan   Last Updated: 13-Feb-14 14:19 by Jill Side  (MD)

## 2015-03-12 NOTE — Consult Note (Signed)
Brief Consult Note: Comments: Patient seen. Full consult dictated. Agree with current management. Additional tests ordered. Further recommendations to follow.  Electronic Signatures: Jill Side (MD)  (Signed 12-Feb-14 09:58)  Authored: Brief Consult Note   Last Updated: 12-Feb-14 09:58 by Jill Side (MD)

## 2015-04-19 DIAGNOSIS — F17210 Nicotine dependence, cigarettes, uncomplicated: Secondary | ICD-10-CM | POA: Insufficient documentation

## 2015-04-19 DIAGNOSIS — L308 Other specified dermatitis: Secondary | ICD-10-CM | POA: Insufficient documentation

## 2015-04-19 DIAGNOSIS — I10 Essential (primary) hypertension: Secondary | ICD-10-CM | POA: Insufficient documentation

## 2015-04-19 DIAGNOSIS — M79606 Pain in leg, unspecified: Secondary | ICD-10-CM | POA: Insufficient documentation

## 2015-04-19 DIAGNOSIS — N62 Hypertrophy of breast: Secondary | ICD-10-CM | POA: Insufficient documentation

## 2015-04-19 DIAGNOSIS — M25559 Pain in unspecified hip: Secondary | ICD-10-CM | POA: Insufficient documentation

## 2015-04-19 DIAGNOSIS — R972 Elevated prostate specific antigen [PSA]: Secondary | ICD-10-CM | POA: Insufficient documentation

## 2015-04-19 DIAGNOSIS — R739 Hyperglycemia, unspecified: Secondary | ICD-10-CM | POA: Insufficient documentation

## 2015-04-19 DIAGNOSIS — M549 Dorsalgia, unspecified: Secondary | ICD-10-CM

## 2015-04-19 DIAGNOSIS — G8929 Other chronic pain: Secondary | ICD-10-CM | POA: Insufficient documentation

## 2015-04-19 DIAGNOSIS — Z1331 Encounter for screening for depression: Secondary | ICD-10-CM | POA: Insufficient documentation

## 2015-04-19 DIAGNOSIS — R011 Cardiac murmur, unspecified: Secondary | ICD-10-CM | POA: Insufficient documentation

## 2015-04-19 DIAGNOSIS — N400 Enlarged prostate without lower urinary tract symptoms: Secondary | ICD-10-CM | POA: Insufficient documentation

## 2015-04-19 DIAGNOSIS — K709 Alcoholic liver disease, unspecified: Secondary | ICD-10-CM | POA: Insufficient documentation

## 2015-04-19 DIAGNOSIS — Z7189 Other specified counseling: Secondary | ICD-10-CM | POA: Insufficient documentation

## 2015-04-19 DIAGNOSIS — Z720 Tobacco use: Secondary | ICD-10-CM | POA: Insufficient documentation

## 2015-04-27 ENCOUNTER — Ambulatory Visit: Payer: Medicare Other | Admitting: Family Medicine

## 2015-09-24 ENCOUNTER — Other Ambulatory Visit: Payer: Self-pay | Admitting: Family Medicine

## 2015-09-24 NOTE — Telephone Encounter (Signed)
Patient requesting refill, last office visit 02/12/15. Patient was to f/u in 1 month.

## 2016-02-02 ENCOUNTER — Other Ambulatory Visit: Payer: Self-pay | Admitting: Family Medicine

## 2016-02-11 ENCOUNTER — Inpatient Hospital Stay: Payer: Medicare Other | Admitting: Anesthesiology

## 2016-02-11 ENCOUNTER — Emergency Department: Payer: Medicare Other

## 2016-02-11 ENCOUNTER — Inpatient Hospital Stay
Admission: EM | Admit: 2016-02-11 | Discharge: 2016-02-22 | DRG: 357 | Disposition: A | Discharge status: Home / Self Care | Payer: Medicare Other | Attending: Internal Medicine | Admitting: Internal Medicine

## 2016-02-11 ENCOUNTER — Inpatient Hospital Stay: Payer: Medicare Other

## 2016-02-11 ENCOUNTER — Encounter
Admission: EM | Disposition: A | Discharge status: Home / Self Care | Payer: Self-pay | Source: Home / Self Care | Attending: Internal Medicine

## 2016-02-11 DIAGNOSIS — R011 Cardiac murmur, unspecified: Secondary | ICD-10-CM | POA: Diagnosis present

## 2016-02-11 DIAGNOSIS — K625 Hemorrhage of anus and rectum: Secondary | ICD-10-CM | POA: Insufficient documentation

## 2016-02-11 DIAGNOSIS — K7030 Alcoholic cirrhosis of liver without ascites: Secondary | ICD-10-CM | POA: Diagnosis not present

## 2016-02-11 DIAGNOSIS — R55 Syncope and collapse: Secondary | ICD-10-CM | POA: Diagnosis present

## 2016-02-11 DIAGNOSIS — K921 Melena: Secondary | ICD-10-CM | POA: Insufficient documentation

## 2016-02-11 DIAGNOSIS — I8500 Esophageal varices without bleeding: Secondary | ICD-10-CM | POA: Diagnosis not present

## 2016-02-11 DIAGNOSIS — S3991XA Unspecified injury of abdomen, initial encounter: Secondary | ICD-10-CM | POA: Diagnosis not present

## 2016-02-11 DIAGNOSIS — K5790 Diverticulosis of intestine, part unspecified, without perforation or abscess without bleeding: Secondary | ICD-10-CM | POA: Diagnosis not present

## 2016-02-11 DIAGNOSIS — S46011A Strain of muscle(s) and tendon(s) of the rotator cuff of right shoulder, initial encounter: Secondary | ICD-10-CM | POA: Diagnosis present

## 2016-02-11 DIAGNOSIS — M75100 Unspecified rotator cuff tear or rupture of unspecified shoulder, not specified as traumatic: Secondary | ICD-10-CM

## 2016-02-11 DIAGNOSIS — W1839XA Other fall on same level, initial encounter: Secondary | ICD-10-CM | POA: Diagnosis present

## 2016-02-11 DIAGNOSIS — D72829 Elevated white blood cell count, unspecified: Secondary | ICD-10-CM

## 2016-02-11 DIAGNOSIS — R52 Pain, unspecified: Secondary | ICD-10-CM

## 2016-02-11 DIAGNOSIS — K3189 Other diseases of stomach and duodenum: Secondary | ICD-10-CM | POA: Diagnosis present

## 2016-02-11 DIAGNOSIS — D62 Acute posthemorrhagic anemia: Secondary | ICD-10-CM | POA: Diagnosis present

## 2016-02-11 DIAGNOSIS — D123 Benign neoplasm of transverse colon: Secondary | ICD-10-CM | POA: Diagnosis present

## 2016-02-11 DIAGNOSIS — R001 Bradycardia, unspecified: Secondary | ICD-10-CM | POA: Diagnosis present

## 2016-02-11 DIAGNOSIS — K635 Polyp of colon: Secondary | ICD-10-CM | POA: Diagnosis not present

## 2016-02-11 DIAGNOSIS — F17210 Nicotine dependence, cigarettes, uncomplicated: Secondary | ICD-10-CM | POA: Diagnosis present

## 2016-02-11 DIAGNOSIS — R58 Hemorrhage, not elsewhere classified: Secondary | ICD-10-CM | POA: Diagnosis not present

## 2016-02-11 DIAGNOSIS — D125 Benign neoplasm of sigmoid colon: Secondary | ICD-10-CM | POA: Diagnosis present

## 2016-02-11 DIAGNOSIS — Z885 Allergy status to narcotic agent status: Secondary | ICD-10-CM

## 2016-02-11 DIAGNOSIS — K259 Gastric ulcer, unspecified as acute or chronic, without hemorrhage or perforation: Secondary | ICD-10-CM | POA: Diagnosis present

## 2016-02-11 DIAGNOSIS — K5731 Diverticulosis of large intestine without perforation or abscess with bleeding: Principal | ICD-10-CM | POA: Diagnosis present

## 2016-02-11 DIAGNOSIS — S42421A Displaced comminuted supracondylar fracture without intercondylar fracture of right humerus, initial encounter for closed fracture: Secondary | ICD-10-CM | POA: Diagnosis not present

## 2016-02-11 DIAGNOSIS — K253 Acute gastric ulcer without hemorrhage or perforation: Secondary | ICD-10-CM | POA: Diagnosis not present

## 2016-02-11 DIAGNOSIS — I440 Atrioventricular block, first degree: Secondary | ICD-10-CM | POA: Diagnosis present

## 2016-02-11 DIAGNOSIS — K709 Alcoholic liver disease, unspecified: Secondary | ICD-10-CM | POA: Diagnosis present

## 2016-02-11 DIAGNOSIS — K766 Portal hypertension: Secondary | ICD-10-CM | POA: Diagnosis present

## 2016-02-11 DIAGNOSIS — Y92002 Bathroom of unspecified non-institutional (private) residence single-family (private) house as the place of occurrence of the external cause: Secondary | ICD-10-CM

## 2016-02-11 DIAGNOSIS — K2971 Gastritis, unspecified, with bleeding: Secondary | ICD-10-CM

## 2016-02-11 DIAGNOSIS — M25511 Pain in right shoulder: Secondary | ICD-10-CM | POA: Diagnosis not present

## 2016-02-11 DIAGNOSIS — K2980 Duodenitis without bleeding: Secondary | ICD-10-CM | POA: Insufficient documentation

## 2016-02-11 DIAGNOSIS — K922 Gastrointestinal hemorrhage, unspecified: Secondary | ICD-10-CM | POA: Diagnosis present

## 2016-02-11 DIAGNOSIS — K5791 Diverticulosis of intestine, part unspecified, without perforation or abscess with bleeding: Secondary | ICD-10-CM

## 2016-02-11 DIAGNOSIS — S0003XA Contusion of scalp, initial encounter: Secondary | ICD-10-CM | POA: Diagnosis present

## 2016-02-11 DIAGNOSIS — R7301 Impaired fasting glucose: Secondary | ICD-10-CM | POA: Diagnosis present

## 2016-02-11 DIAGNOSIS — K7460 Unspecified cirrhosis of liver: Secondary | ICD-10-CM | POA: Diagnosis not present

## 2016-02-11 DIAGNOSIS — D649 Anemia, unspecified: Secondary | ICD-10-CM | POA: Diagnosis not present

## 2016-02-11 HISTORY — PX: ESOPHAGOGASTRODUODENOSCOPY (EGD) WITH PROPOFOL: SHX5813

## 2016-02-11 LAB — CBC
HCT: 32.3 % — ABNORMAL LOW (ref 40.0–52.0)
Hemoglobin: 10.8 g/dL — ABNORMAL LOW (ref 13.0–18.0)
MCH: 31.8 pg (ref 26.0–34.0)
MCHC: 33.6 g/dL (ref 32.0–36.0)
MCV: 94.8 fL (ref 80.0–100.0)
PLATELETS: 181 10*3/uL (ref 150–440)
RBC: 3.4 MIL/uL — ABNORMAL LOW (ref 4.40–5.90)
RDW: 14.8 % — AB (ref 11.5–14.5)
WBC: 10.3 10*3/uL (ref 3.8–10.6)

## 2016-02-11 LAB — COMPREHENSIVE METABOLIC PANEL
ALK PHOS: 103 U/L (ref 38–126)
ALT: 23 U/L (ref 17–63)
ANION GAP: 5 (ref 5–15)
AST: 36 U/L (ref 15–41)
Albumin: 3.1 g/dL — ABNORMAL LOW (ref 3.5–5.0)
BUN: 9 mg/dL (ref 6–20)
CALCIUM: 8 mg/dL — AB (ref 8.9–10.3)
CHLORIDE: 109 mmol/L (ref 101–111)
CO2: 23 mmol/L (ref 22–32)
Creatinine, Ser: 0.82 mg/dL (ref 0.61–1.24)
GFR calc non Af Amer: >60 mL/min (ref >60)
Glucose, Bld: 232 mg/dL — ABNORMAL HIGH (ref 65–99)
POTASSIUM: 3.6 mmol/L (ref 3.5–5.1)
SODIUM: 137 mmol/L (ref 135–145)
Total Bilirubin: 0.3 mg/dL (ref 0.3–1.2)
Total Protein: 6.2 g/dL — ABNORMAL LOW (ref 6.5–8.1)

## 2016-02-11 LAB — PROTIME-INR
INR: 1.28
Prothrombin Time: 16.1 seconds — ABNORMAL HIGH (ref 11.4–15.0)

## 2016-02-11 LAB — GLUCOSE, CAPILLARY
GLUCOSE-CAPILLARY: 98 mg/dL (ref 65–99)
GLUCOSE-CAPILLARY: 82 mg/dL (ref 65–99)

## 2016-02-11 LAB — ABO/RH: ABO/RH(D): O POS

## 2016-02-11 LAB — HEMOGLOBIN
Hemoglobin: 9.7 g/dL — ABNORMAL LOW (ref 13.0–18.0)
Hemoglobin: 9.4 g/dL — ABNORMAL LOW (ref 13.0–18.0)

## 2016-02-11 LAB — TYPE AND SCREEN
ABO/RH(D): O POS
ANTIBODY SCREEN: NEGATIVE

## 2016-02-11 SURGERY — ESOPHAGOGASTRODUODENOSCOPY (EGD) WITH PROPOFOL
Anesthesia: General

## 2016-02-11 MED ORDER — PANTOPRAZOLE SODIUM 40 MG IV SOLR
40.0000 mg | Freq: Two times a day (BID) | INTRAVENOUS | Status: DC
Start: 2016-02-11 — End: 2016-02-21
  Administered 2016-02-11 – 2016-02-21 (×21): 40 mg via INTRAVENOUS
  Filled 2016-02-11 (×21): qty 40

## 2016-02-11 MED ORDER — DIATRIZOATE MEGLUMINE & SODIUM 66-10 % PO SOLN: 15.0000 mL | Freq: Once | ORAL | Status: DC

## 2016-02-11 MED ORDER — OXYCODONE HCL 5 MG PO TABS
5.0000 mg | ORAL_TABLET | ORAL | Status: DC | PRN
  Administered 2016-02-11 – 2016-02-21 (×42): 5 mg via ORAL
  Filled 2016-02-11 (×43): qty 1

## 2016-02-11 MED ORDER — ADULT MULTIVITAMIN W/MINERALS CH
1.0000 | ORAL_TABLET | Freq: Every day | ORAL | Status: DC
Start: 2016-02-11 — End: 2016-02-22
  Administered 2016-02-11 – 2016-02-22 (×9): 1 via ORAL
  Filled 2016-02-11 (×9): qty 1

## 2016-02-11 MED ORDER — ACETAMINOPHEN 325 MG PO TABS: 650.0000 mg | ORAL_TABLET | Freq: Four times a day (QID) | ORAL | Status: DC | PRN

## 2016-02-11 MED ORDER — DIATRIZOATE MEGLUMINE & SODIUM 66-10 % PO SOLN
15.0000 mL | Freq: Once | ORAL | Status: AC
  Administered 2016-02-11: 15 mL via ORAL

## 2016-02-11 MED ORDER — INSULIN ASPART 100 UNIT/ML ~~LOC~~ SOLN
0.0000 [IU] | Freq: Three times a day (TID) | SUBCUTANEOUS | Status: DC
  Administered 2016-02-12: 2 [IU] via SUBCUTANEOUS
  Filled 2016-02-11: qty 2

## 2016-02-11 MED ORDER — PROPOFOL 10 MG/ML IV BOLUS
INTRAVENOUS | Status: DC | PRN
  Administered 2016-02-11: 80 mg via INTRAVENOUS

## 2016-02-11 MED ORDER — POLYETHYLENE GLYCOL 3350 17 GM/SCOOP PO POWD
1.0000 | Freq: Once | ORAL | Status: AC
  Administered 2016-02-11: 255 g via ORAL
  Filled 2016-02-11: qty 255

## 2016-02-11 MED ORDER — SODIUM CHLORIDE 0.9 % IV BOLUS (SEPSIS)
1000.0000 mL | Freq: Once | INTRAVENOUS | Status: AC
  Administered 2016-02-11: 1000 mL via INTRAVENOUS

## 2016-02-11 MED ORDER — SODIUM CHLORIDE 0.9% FLUSH
3.0000 mL | Freq: Two times a day (BID) | INTRAVENOUS | Status: DC
  Administered 2016-02-11 – 2016-02-22 (×17): 3 mL via INTRAVENOUS

## 2016-02-11 MED ORDER — LORAZEPAM 1 MG PO TABS: 1.0000 mg | ORAL_TABLET | Freq: Four times a day (QID) | ORAL | Status: AC | PRN

## 2016-02-11 MED ORDER — PEG 3350-KCL-NA BICARB-NACL 420 G PO SOLR
2000.0000 mL | Freq: Once | ORAL | Status: AC
  Administered 2016-02-11: 2000 mL via ORAL
  Filled 2016-02-11: qty 4000

## 2016-02-11 MED ORDER — PEG 3350-KCL-NA BICARB-NACL 420 G PO SOLR: 2000.0000 mL | Freq: Once | ORAL | Status: DC

## 2016-02-11 MED ORDER — VITAMIN B-1 100 MG PO TABS
100.0000 mg | ORAL_TABLET | Freq: Every day | ORAL | Status: DC
  Administered 2016-02-11 – 2016-02-22 (×9): 100 mg via ORAL
  Filled 2016-02-11 (×9): qty 1

## 2016-02-11 MED ORDER — IOPAMIDOL (ISOVUE-370) INJECTION 76%
100.0000 mL | Freq: Once | INTRAVENOUS | Status: AC | PRN
  Administered 2016-02-11: 100 mL via INTRAVENOUS

## 2016-02-11 MED ORDER — SODIUM CHLORIDE 0.9 % IV BOLUS (SEPSIS)
1000.0000 mL | Freq: Once | INTRAVENOUS | Status: AC
Start: 2016-02-11 — End: 2016-02-11
  Administered 2016-02-11: 1000 mL via INTRAVENOUS

## 2016-02-11 MED ORDER — NICOTINE 14 MG/24HR TD PT24
14.0000 mg | MEDICATED_PATCH | Freq: Every day | TRANSDERMAL | Status: DC
  Administered 2016-02-11 – 2016-02-14 (×4): 14 mg via TRANSDERMAL
  Filled 2016-02-11 (×7): qty 1

## 2016-02-11 MED ORDER — THIAMINE HCL 100 MG/ML IJ SOLN
100.0000 mg | Freq: Every day | INTRAMUSCULAR | Status: DC
Start: 2016-02-11 — End: 2016-02-16

## 2016-02-11 MED ORDER — FOLIC ACID 1 MG PO TABS
1.0000 mg | ORAL_TABLET | Freq: Every day | ORAL | Status: DC
  Administered 2016-02-11 – 2016-02-22 (×9): 1 mg via ORAL
  Filled 2016-02-11 (×9): qty 1

## 2016-02-11 MED ORDER — ACETAMINOPHEN 650 MG RE SUPP: 650.0000 mg | Freq: Four times a day (QID) | RECTAL | Status: DC | PRN

## 2016-02-11 MED ORDER — DIATRIZOATE MEGLUMINE & SODIUM 66-10 % PO SOLN: 15.0000 mL | ORAL | Status: DC

## 2016-02-11 MED ORDER — INSULIN ASPART 100 UNIT/ML ~~LOC~~ SOLN: 0.0000 [IU] | Freq: Every day | SUBCUTANEOUS | Status: DC

## 2016-02-11 MED ORDER — LORAZEPAM 2 MG/ML IJ SOLN: 1.0000 mg | Freq: Four times a day (QID) | INTRAMUSCULAR | Status: AC | PRN

## 2016-02-11 MED ORDER — SODIUM CHLORIDE 0.9 % IV SOLN
INTRAVENOUS | Status: DC
  Administered 2016-02-11 – 2016-02-13 (×4): via INTRAVENOUS

## 2016-02-11 NOTE — Consult Note (Signed)
[facility]  [address], [place], [place] [address] Phone: [phone] Fax : [phone]  Consultation  Referring Provider:     No ref. provider found Primary Care Physician:  Ollen Bowl, MD Primary Gastroenterologist:  Dr. Candace Cruise         Reason for Consultation:     GI bleed  Date of Admission:  02/11/2016 Date of Consultation:  02/11/2016         HPI:   Craig Maldonado is a 65 y.o. male who has a history of alcohol abuse and liver disease. The patient continues to drink and reports that he drinks approximately 3 beers per day. The patient was admitted after reporting that he had a large amount of rectal bleeding with bright red blood and clots. The patient did not vomit up any blood but had been doing fine until he had this large amount of rectal bleeding. The patient then had a near syncopal event with him falling and hitting his head in the bathroom. The patient was then brought to the emergency department. The patient has not had any further signs of bleeding. The patient has been Nothing by mouth with just sips of water. He denies any abdominal pain or nausea or vomiting. He has been stable since being in the hospital but has had a drop in his hemoglobin by 1 g this morning.  History reviewed. No pertinent past medical history.  History reviewed. No pertinent past surgical history.  Prior to Admission medications   Medication Sig Start Date End Date Taking? Authorizing Provider  furosemide (LASIX) 40 MG tablet TAKE ONE TABLET BY MOUTH ONCE DAILY---PATIENT NEEDS AN APPOINTMENT FOR OFFICE FOLLOW UP TO GET ANY MORE MEDICATION 02/02/16  Yes Arlis Porta., MD  spironolactone (ALDACTONE) 25 MG tablet TAKE ONE TABLET BY MOUTH ONCE DAILY Patient not taking: Reported on 02/11/2016 02/02/16   Arlis Porta., MD    History reviewed. No pertinent family history.   Social History  Substance Use Topics  . Smoking status: Current Every Day Smoker  . Smokeless tobacco:  None  . Alcohol Use: None    Allergies as of 02/11/2016 - Review Complete 02/11/2016  Allergen Reaction Noted  . Codeine Nausea And Vomiting 04/19/2015    Review of Systems:    All systems reviewed and negative except where noted in HPI.   Physical Exam:  Vital signs in last 24 hours: Temp:  [97.3 F (36.3 C)-97.8 F (36.6 C)] 97.8 F (36.6 C) (03/24 0938) Pulse Rate:  [86-95] 92 (03/24 0938) Resp:  [14-32] 22 (03/24 0938) BP: (109-160)/(64-85) 133/69 mmHg (03/24 0938) SpO2:  [87 %-100 %] 97 % (03/24 0938) Weight:  [205 lb (92.987 kg)-222 lb 6.4 oz (100.88 kg)] 222 lb 6.4 oz (100.88 kg) (03/24 1028) Last BM Date: 02/11/16 General:   Pleasant, cooperative in NAD Head:  Normocephalic and atraumatic. Eyes:   No icterus.   Conjunctiva pink. PERRLA. Ears:  Normal auditory acuity. Neck:  Supple; no masses or thyroidomegaly Lungs: Respirations even and unlabored. Lungs clear to auscultation bilaterally.   No wheezes, crackles, or rhonchi.  Heart:  Regular rate and rhythm;  Without murmur, clicks, rubs or gallops Abdomen:  Soft, nondistended, nontender. Normal bowel sounds. No appreciable masses or hepatomegaly.  No rebound or guarding.  Rectal:  Not performed. Msk:  Symmetrical without gross deformities.  Strength  Extremities:  Without edema, cyanosis or clubbing. Neurologic:  Alert and oriented x3;  grossly normal neurologically. Skin:  Intact without  significant lesions or rashes. Cervical Nodes:  No significant cervical adenopathy. Psych:  Alert and cooperative. Normal affect.  LAB RESULTS:  Recent Labs  02/11/16 0606 02/11/16 0955  WBC 10.3  --   HGB 10.8* 9.7*  HCT 32.3*  --   PLT 181  --    BMET  Recent Labs  02/11/16 0606  NA 137  K 3.6  CL 109  CO2 23  GLUCOSE 232*  BUN 9  CREATININE 0.82  CALCIUM 8.0*   LFT  Recent Labs  02/11/16 0606  PROT 6.2*  ALBUMIN 3.1*  AST 36  ALT 23  ALKPHOS 103  BILITOT 0.3   PT/INR  Recent Labs   02/11/16 0606  LABPROT 16.1*  INR 1.28    STUDIES: Ct Head Wo Contrast  02/11/2016  CLINICAL DATA:  Initial evaluation for acute syncope with forehead trauma. EXAM: CT HEAD WITHOUT CONTRAST TECHNIQUE: Contiguous axial images were obtained from the base of the skull through the vertex without intravenous contrast. COMPARISON:  Prior study from 07/19/2014. FINDINGS: Small contusion at the right forehead present. Scalp soft tissues otherwise within normal limits. No acute abnormality about the orbits. Mild mucosal thickening within the ethmoidal air cells and sphenoid sinuses, likely chronic. Paranasal sinuses are otherwise clear. No mastoid effusion. Calvarium intact. Probable remote posttraumatic deformity noted at the left orbital floor. Mild atrophy with chronic small vessel ischemic disease. 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 No acute large vessel territory infarct. No intracranial hemorrhage. No mass lesion, midline shift or mass effect. No hydrocephalus. No extra-axial fluid collection. IMPRESSION: 1. No acute intracranial process. 2. Small scalp contusion/laceration at the right forehead. 3. Cerebral atrophy with mild chronic small vessel ischemic disease. Electronically Signed   By: Jeannine Boga M.D.   On: 02/11/2016 07:03   Ct Abdomen Pelvis W Contrast  02/11/2016  CLINICAL DATA:  Fall, anemia, rectal bleeding EXAM: CT ABDOMEN AND PELVIS WITH CONTRAST TECHNIQUE: Multidetector CT imaging of the abdomen and pelvis was performed using the standard protocol following bolus administration of intravenous contrast. CONTRAST:  100 cc Isovue 370 COMPARISON:  12/31/2012 FINDINGS: Lower chest: Atherosclerosis of the lower thoracic aorta. Coronary atherosclerosis noted as well. Calcifications of the mitral and aortic valves. Normal heart size. No pericardial or pleural effusion. Right lower chest demonstrates pleural thickening with small punctate pleural  calcifications, suspect sequelae from prior inflammation/ infection or trauma. Degenerative changes noted of the thoracic spine with large osteophytes on the right Hepatobiliary: Micro nodular hepatic surface compatible with background cirrhosis. Small 6 mm hepatic dome hypodensity, image 13 suspect small cyst. Hypertrophy noted of the left lobe. Hepatic and portal veins remain patent. No biliary dilatation. Gallbladder is collapsed but does contain a small punctate calcified gallstone measuring 3 mm, image 21. Pancreas: No mass, inflammatory changes, or other significant abnormality. Spleen: Within normal limits in size and appearance. Adrenals/Urinary Tract: Normal adrenal glands. Small bilateral hypodense cortical renal cysts, 10 mm or less in size. No renal obstruction or hydronephrosis. No obstructing ureteral calculus. Ureters are symmetric and decompressed bilaterally. Stomach/Bowel: Negative for bowel obstruction, significant dilatation, ileus, or free air. Cecum is high in the right upper quadrant. Normal appendix demonstrated containing contrast, retrocecal in position. left descending colon and sigmoid diverticulosis without acute inflammatory process. No fluid collection or abscess. Negative for ascites. Vascular/Lymphatic: Abdominal atherosclerosis noted. Negative for aneurysm. No occlusive process. No adenopathy. Reproductive: No mass or other significant abnormality. Other: No inguinal adenopathy. Small right inguinal fat containing hernia.  Intact abdominal wall. No ventral hernia. Musculoskeletal:  Diffuse degenerative changes of the spine. IMPRESSION: Hepatic cirrhosis and scattered small hepatic hypodensities, suspect small hepatic cysts. Incidental cholelithiasis without biliary dilatation or cholecystitis. Aortic atherosclerosis without aneurysm Diverticulosis without acute inflammatory process small fat containing right inguinal hernia Electronically Signed   By: Jerilynn Mages.  Shick M.D.   On: 02/11/2016  09:22      Impression / Plan:   Craig Maldonado is a 65 y.o. y/o male with who comes in with rectal bleeding and passing of clots. The patient has a history of alcoholic liver disease. The patient may have a lower GI bleed but due to his syncopal episode and drop in his hemoglobin by 1 g over the last few hours a upper GI bleed is also possible. The patient will be set up for an EGD for today. The patient has been explained the plan and agrees with it.   Thank you for involving me in the care of this patient.      LOS: 0 days   Ollen Bowl, MD  02/11/2016, 12:54 PM   Note: This dictation was prepared with Dragon dictation along with smaller phrase technology. Any transcriptional errors that result from this process are unintentional.

## 2016-02-11 NOTE — ED Notes (Signed)
CT informed that patient has finished contrast  

## 2016-02-11 NOTE — Progress Notes (Signed)
Pt is hurting.  Said he did not want anything with tylenol due to his liver.  Oxycodone ordered.  I spoke with pharmacy about codeine allergy

## 2016-02-11 NOTE — Progress Notes (Signed)
Dr Allen Norris said to make the pt NPO for EGD.  Pt is currently NPO

## 2016-02-11 NOTE — Anesthesia Preprocedure Evaluation (Addendum)
Anesthesia Evaluation  Patient identified by MRN, date of birth, ID band Patient awake    Reviewed: Allergy & Precautions, NPO status , Patient's Chart, lab work & pertinent test results  History of Anesthesia Complications Negative for: history of anesthetic complications  Airway Mallampati: II       Dental  (+) Edentulous Upper, Upper Dentures   Pulmonary Current Smoker,           Cardiovascular hypertension, + Valvular Problems/Murmurs      Neuro/Psych negative neurological ROS  negative psych ROS   GI/Hepatic negative GI ROS, Neg liver ROS,   Endo/Other  negative endocrine ROS  Renal/GU negative Renal ROS     Musculoskeletal   Abdominal   Peds  Hematology negative hematology ROS (+)   Anesthesia Other Findings   Reproductive/Obstetrics                            Anesthesia Physical Anesthesia Plan  ASA: III  Anesthesia Plan: General   Post-op Pain Management:    Induction: Intravenous  Airway Management Planned: Nasal Cannula  Additional Equipment:   Intra-op Plan:   Post-operative Plan:   Informed Consent: I have reviewed the patients History and Physical, chart, labs and discussed the procedure including the risks, benefits and alternatives for the proposed anesthesia with the patient or authorized representative who has indicated his/her understanding and acceptance.     Plan Discussed with:   Anesthesia Plan Comments:         Anesthesia Quick Evaluation

## 2016-02-11 NOTE — ED Notes (Signed)
1st bottle of CT contrast started at 640am - pt to start the 2nd bottle at 740am

## 2016-02-11 NOTE — ED Notes (Addendum)
Pt arrived by ems for c/o rectal bleeding - ems reports large amount of rectal bleeding noted at scene - pt reported that he felt like he had to have a bowel movement and when he went to the bathroom he noticed a lot of blood in the toilet and them "passed out" and hit his head - pt is unsure what he hit his head on - MD at bedside assessing pt - Pt has sm lac above right eyebrow from fall at the home when pt went to the bathroom and is c/o headache

## 2016-02-11 NOTE — Progress Notes (Signed)
MD patient could not tolerate golytely. MD notified. Order received to give gatorade/miralax

## 2016-02-11 NOTE — ED Notes (Signed)
Patient had 1 episode of bloody stool in toilet.

## 2016-02-11 NOTE — ED Notes (Signed)
Pt now c/o right shoulder pain - reported this to MD Owens Shark

## 2016-02-11 NOTE — H&P (Addendum)
[facility] [place] NAME: Craig Maldonado    MR#:  [id number]  DATE OF BIRTH:  [birth year]  DATE OF ADMISSION:  02/11/2016  PRIMARY CARE PHYSICIAN: Dr Luan Pulling  REQUESTING/REFERRING PHYSICIAN: Dr Owens Shark  CHIEF COMPLAINT:   Chief Complaint  Patient presents with  . GI Bleeding    HISTORY OF PRESENT ILLNESS:  Craig Maldonado  is a 65 y.o. male presents after a syncopal episode and noticing blood in the toilet and all over the floor and on his leg. He states that he woke up this morning and he passed gas. He went to the toilet and sat on the toilet and he had a bowel movements. He then saw stars got dizzy and passed out. When he woke up he noticed blood on his leg and sock and on the floor and in the toilet. Bright red blood per rectum. In the ER his hemoglobin was found to be 10.8. Last year it was 14.4.  PAST MEDICAL HISTORY:  History reviewed. No pertinent past medical history.  PAST SURGICAL HISTORY:   Past Surgical History  Procedure Laterality Date  . Right leg surgery      SOCIAL HISTORY:   Social History  Substance Use Topics  . Smoking status: Current Every Day Smoker -- 0.50 packs/day  . Smokeless tobacco: Not on file  . Alcohol Use: Yes    FAMILY HISTORY:   Family History  Problem Relation Age of Onset  . Healthy Mother   . Tuberculosis Father     DRUG ALLERGIES:   Allergies  Allergen Reactions  . Codeine Nausea And Vomiting    REVIEW OF SYSTEMS:  CONSTITUTIONAL: No fever, fatigue or weakness. Positive for chills EYES: No blurred or double vision. Saw stars before passing out EARS, NOSE, AND THROAT: No tinnitus or ear pain. No sore throat RESPIRATORY: No cough, shortness of breath, wheezing or hemoptysis.  CARDIOVASCULAR: No chest pain, orthopnea, edema.  GASTROINTESTINAL: No nausea, vomiting, diarrhea or abdominal pain. Bright red blood per rectum GENITOURINARY: No dysuria, hematuria.  ENDOCRINE: No  polyuria, nocturia,  HEMATOLOGY: No anemia, easy bruising or bleeding SKIN: No rash or lesion. MUSCULOSKELETAL: No joint pain or arthritis. Right shoulder pain with fall  NEUROLOGIC: No tingling, numbness, weakness.  PSYCHIATRY: No anxiety or depression.   MEDICATIONS AT HOME:   Prior to Admission medications   Medication Sig Start Date End Date Taking? Authorizing Provider  furosemide (LASIX) 40 MG tablet TAKE ONE TABLET BY MOUTH ONCE DAILY---PATIENT NEEDS AN APPOINTMENT FOR OFFICE FOLLOW UP TO GET ANY MORE MEDICATION 02/02/16  Yes Arlis Porta., MD  spironolactone (ALDACTONE) 25 MG tablet TAKE ONE TABLET BY MOUTH ONCE DAILY Patient not taking: Reported on 02/11/2016 02/02/16   Arlis Porta., MD      VITAL SIGNS:  Blood pressure 133/69, pulse 92, temperature 97.8 F (36.6 C), temperature source Oral, resp. rate 22, height 5\' 11"  (1.803 m), weight 100.88 kg (222 lb 6.4 oz), SpO2 97 %.  PHYSICAL EXAMINATION:  GENERAL:  65 y.o.-year-old patient lying in the bed with no acute distress.  EYES: Pupils equal, round, reactive to light and accommodation. No scleral icterus. Extraocular muscles intact.  HEENT: Head atraumatic, normocephalic. Oropharynx and nasopharynx clear.  NECK:  Supple, no jugular venous distention. No thyroid enlargement, no tenderness.  LUNGS: Normal breath sounds bilaterally, no wheezing, rales,rhonchi or crepitation. No use of accessory muscles of respiration.  CARDIOVASCULAR: S1, S2 normal. No murmurs, rubs, or  gallops.  ABDOMEN: Soft, nontender, nondistended. Bowel sounds present. No organomegaly or mass.  EXTREMITIES: No pedal edema, cyanosis, or clubbing.  NEUROLOGIC: Cranial nerves II through XII are intact. Muscle strength 5/5 in all extremities. Sensation intact. Gait not checked.  PSYCHIATRIC: The patient is alert and oriented x 3.  SKIN: Small little laceration above right forehead  LABORATORY PANEL:   CBC  Recent Labs Lab 02/11/16 0606  02/11/16 0955  WBC 10.3  --   HGB 10.8* 9.7*  HCT 32.3*  --   PLT 181  --    ------------------------------------------------------------------------------------------------------------------  Chemistries   Recent Labs Lab 02/11/16 0606  NA 137  K 3.6  CL 109  CO2 23  GLUCOSE 232*  BUN 9  CREATININE 0.82  CALCIUM 8.0*  AST 36  ALT 23  ALKPHOS 103  BILITOT 0.3   ------------------------------------------------------------------------------------------------------------------   RADIOLOGY:  Ct Head Wo Contrast  02/11/2016  CLINICAL DATA:  Initial evaluation for acute syncope with forehead trauma. EXAM: CT HEAD WITHOUT CONTRAST TECHNIQUE: Contiguous axial images were obtained from the base of the skull through the vertex without intravenous contrast. COMPARISON:  Prior study from 07/19/2014. FINDINGS: Small contusion at the right forehead present. Scalp soft tissues otherwise within normal limits. No acute abnormality about the orbits. Mild mucosal thickening within the ethmoidal air cells and sphenoid sinuses, likely chronic. Paranasal sinuses are otherwise clear. No mastoid effusion. Calvarium intact. Probable remote posttraumatic deformity noted at the left orbital floor. Mild atrophy with chronic small vessel ischemic disease. 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 No acute large vessel territory infarct. No intracranial hemorrhage. No mass lesion, midline shift or mass effect. No hydrocephalus. No extra-axial fluid collection. IMPRESSION: 1. No acute intracranial process. 2. Small scalp contusion/laceration at the right forehead. 3. Cerebral atrophy with mild chronic small vessel ischemic disease. Electronically Signed   By: Jeannine Boga M.D.   On: 02/11/2016 07:03   Ct Abdomen Pelvis W Contrast  02/11/2016  CLINICAL DATA:  Fall, anemia, rectal bleeding EXAM: CT ABDOMEN AND PELVIS WITH CONTRAST TECHNIQUE: Multidetector CT imaging of the  abdomen and pelvis was performed using the standard protocol following bolus administration of intravenous contrast. CONTRAST:  100 cc Isovue 370 COMPARISON:  12/31/2012 FINDINGS: Lower chest: Atherosclerosis of the lower thoracic aorta. Coronary atherosclerosis noted as well. Calcifications of the mitral and aortic valves. Normal heart size. No pericardial or pleural effusion. Right lower chest demonstrates pleural thickening with small punctate pleural calcifications, suspect sequelae from prior inflammation/ infection or trauma. Degenerative changes noted of the thoracic spine with large osteophytes on the right Hepatobiliary: Micro nodular hepatic surface compatible with background cirrhosis. Small 6 mm hepatic dome hypodensity, image 13 suspect small cyst. Hypertrophy noted of the left lobe. Hepatic and portal veins remain patent. No biliary dilatation. Gallbladder is collapsed but does contain a small punctate calcified gallstone measuring 3 mm, image 21. Pancreas: No mass, inflammatory changes, or other significant abnormality. Spleen: Within normal limits in size and appearance. Adrenals/Urinary Tract: Normal adrenal glands. Small bilateral hypodense cortical renal cysts, 10 mm or less in size. No renal obstruction or hydronephrosis. No obstructing ureteral calculus. Ureters are symmetric and decompressed bilaterally. Stomach/Bowel: Negative for bowel obstruction, significant dilatation, ileus, or free air. Cecum is high in the right upper quadrant. Normal appendix demonstrated containing contrast, retrocecal in position. left descending colon and sigmoid diverticulosis without acute inflammatory process. No fluid collection or abscess. Negative for ascites. Vascular/Lymphatic: Abdominal atherosclerosis noted. Negative for aneurysm. No occlusive  process. No adenopathy. Reproductive: No mass or other significant abnormality. Other: No inguinal adenopathy. Small right inguinal fat containing hernia. Intact  abdominal wall. No ventral hernia. Musculoskeletal:  Diffuse degenerative changes of the spine. IMPRESSION: Hepatic cirrhosis and scattered small hepatic hypodensities, suspect small hepatic cysts. Incidental cholelithiasis without biliary dilatation or cholecystitis. Aortic atherosclerosis without aneurysm Diverticulosis without acute inflammatory process small fat containing right inguinal hernia Electronically Signed   By: Jerilynn Mages.  Shick M.D.   On: 02/11/2016 09:22    EKG:   Normal sinus rhythm 91 bpm  IMPRESSION AND PLAN:   1. Acute GI bleed. Likely lower GI bleed with bright red blood per rectum. Patient does have a history of alcohol. Case discussed with Dr. Allen Norris gastroenterology to do an upper endoscopy with the patient's history of alcohol abuse in the past. Will start on IV Protonix. Serial hemoglobins and transfuse if needed. This could be a diverticular bleed. Continue to monitor closely. Patient currently nothing by mouth for endoscopy. 2. Hepatic cirrhosis seen on CT scan. We'll check hepatitis profiles. 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 3. Tobacco abuse smoking cessation counseling done 3 minutes by me. Nicotine patch ordered 4. Alcohol abuse. I will put on [place] protocol. 5. Right shoulder pain we'll get an x-ray 6. Impaired fasting glucose. Check a hemoglobin A1c and put on sliding scale.   All the records are reviewed and case discussed with ED provider. Management plans discussed with the patient, family and they are in agreement.  CODE STATUS: Full code  TOTAL TIME TAKING CARE OF THIS PATIENT: 50 minutes.    Loletha Grayer M.D on 02/11/2016 at 1:58 PM, patient seen this morning at 7:15 AM  Between 7am to 6pm - Pager - [phone]  After 6pm call admission pager [facility]  Office  [phone]  CC: Primary care physician; Ollen Bowl, MD

## 2016-02-11 NOTE — ED Notes (Signed)
Arrived by ems - this am reported went to bathroom and had large amount of blood from rectum - fell and hit right forehead

## 2016-02-11 NOTE — ED Provider Notes (Signed)
[facility] Emergency Department Provider Note  ____________________________________________  Time seen: 6:00 AM  I have reviewed the triage vital signs and the nursing notes.   HISTORY  Chief Complaint GI Bleeding      HPI Craig Maldonado is a 65 y.o. male arrived via EMS with history of "large amount of bright red blood per rectum this morning followed by syncopal episode with right forehead injury. Patient states he woke up this morning felt as though he attempted bowel movement patient states when he went to the bathroom he noted large amount of bright bright red blood from his rectum in the toilet upon standing patient states that he lost consciousness. Patient denies any previous GI bleeds. EMS brought a picture in which revealed approximately 1 unit of bright red blood on the bathroom floor. Patient denies any abdominal pain at this time. Of note patient admits to previous history of daily alcohol ingestion but states that he has not been doing that for a long time.     History reviewed. No pertinent past medical history.  Patient Active Problem List   Diagnosis Date Noted  . GI bleed 02/11/2016  . Cardiac murmur 04/19/2015  . Back pain, chronic 04/19/2015  . Arthralgia of hip 04/19/2015  . Leg pain 04/19/2015  . Current tobacco use 04/19/2015  . Blood glucose elevated 04/19/2015  . Elevated blood sugar 04/19/2015  . Abnormal prostate specific antigen 04/19/2015  . Excoriated eczema 04/19/2015  . Breast development in males 04/19/2015  . Benign hypertension 04/19/2015  . 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 Benign prostatic hypertrophy without urinary obstruction 04/19/2015  . Screening for depression 04/19/2015  . Cigarette smoker 04/19/2015  . Routine general medical examination at a health care facility 04/19/2015    History reviewed. No pertinent past surgical history.  Current Outpatient Rx  Name  Route  Sig  Dispense   Refill  . furosemide (LASIX) 40 MG tablet      TAKE ONE TABLET BY MOUTH ONCE DAILY---PATIENT NEEDS AN APPOINTMENT FOR OFFICE FOLLOW UP TO GET ANY MORE MEDICATION   10 tablet   0   . spironolactone (ALDACTONE) 25 MG tablet      TAKE ONE TABLET BY MOUTH ONCE DAILY Patient not taking: Reported on 02/11/2016   10 tablet   0     Allergies Codeine  History reviewed. No pertinent family history.  Social History Social History  Substance Use Topics  . Smoking status: Current Every Day Smoker  . Smokeless tobacco: None  . Alcohol Use: None    Review of Systems  Constitutional: Negative for fever. Eyes: Negative for visual changes. ENT: Negative for sore throat. Cardiovascular: Negative for chest pain. Respiratory: Negative for shortness of breath. Gastrointestinal: Positive for rectal bleeding Genitourinary: Negative for dysuria. Musculoskeletal: Negative for back pain. Skin: Negative for rash. Neurological: Negative for headaches, focal weakness or numbness.   10-point ROS otherwise negative.  ____________________________________________   PHYSICAL EXAM:  VITAL SIGNS: ED Triage Vitals  Enc Vitals Group     BP 02/11/16 0603 109/64 mmHg     Pulse Rate 02/11/16 0603 95     Resp 02/11/16 0603 15     Temp 02/11/16 0603 97.3 F (36.3 C)     Temp Source 02/11/16 0603 Oral     SpO2 02/11/16 0603 87 %     Weight 02/11/16 0603 205 lb (92.987 kg)     Height 02/11/16 0603 5\' 11"  (1.803 m)  Head Cir --      Peak Flow --      Pain Score 02/11/16 0604 0     Pain Loc --      Pain Edu? --      Excl. in [place]? --     Constitutional: Alert and oriented. Well appearing and in no distress. Eyes: Conjunctivae are normal. PERRL. Normal extraocular movements. ENT   Head: Normocephalic and atraumatic.   Nose: No congestion/rhinnorhea.   Mouth/Throat: Mucous membranes are moist.   Neck: No stridor. Hematological/Lymphatic/Immunilogical: No cervical  lymphadenopathy. Cardiovascular: Normal rate, regular rhythm. Normal and symmetric distal pulses are present in all extremities. No murmurs, rubs, or gallops. Respiratory: Normal respiratory effort without tachypnea nor retractions. Breath sounds are clear and equal bilaterally. No wheezes/rales/rhonchi. Gastrointestinal: Soft and nontender. No distention. There is no CVA tenderness. Guaiac positive Genitourinary: deferred Musculoskeletal: Nontender with normal range of motion in all extremities. No joint effusions.  No lower extremity tenderness nor edema. Neurologic:  Normal speech and language. No gross focal neurologic deficits are appreciated. Speech is normal.  Skin:  Skin is warm, dry and intact. No rash noted. Psychiatric: Mood and affect are normal. Speech and behavior are normal. Patient exhibits appropriate insight and judgment.  ____________________________________________    LABS (pertinent positives/negatives)  Labs Reviewed  CBC - Abnormal; Notable for the following:    RBC 3.40 (*)    Hemoglobin 10.8 (*)    HCT 32.3 (*)    RDW 14.8 (*)    All other components within normal limits  COMPREHENSIVE METABOLIC PANEL - Abnormal; Notable for the following:    Glucose, Bld 232 (*)    Calcium 8.0 (*)    Total Protein 6.2 (*)    Albumin 3.1 (*)    All other components within normal limits  PROTIME-INR - Abnormal; Notable for the following:    Prothrombin Time 16.1 (*)    All other components within normal limits  HEMOGLOBIN  HEMOGLOBIN  HEMOGLOBIN  TYPE AND SCREEN  ABO/RH     ____________________________________________   EKG  ED ECG REPORT I, Grady N BROWN, the attending physician, personally viewed and interpreted this ECG.   Date: 02/11/2016  EKG Time: 6:07 AM  Rate: 91  Rhythm: Normal sinus rhythm  Axis: None  Intervals: Normal  ST&T Change: None   ____________________________________________    RADIOLOGY  CT Head Wo Contrast (Final result)  Result time: 02/11/16 07:03:31   Final result by Rad Results In Interface (02/11/16 07:03:31)   Narrative:   CLINICAL DATA: Initial evaluation for acute syncope with forehead trauma.  EXAM: CT HEAD WITHOUT CONTRAST  TECHNIQUE: Contiguous axial images were obtained from the base of the skull through the vertex without intravenous contrast.  COMPARISON: Prior study from 07/19/2014.  FINDINGS: Small contusion at the right forehead present. Scalp soft tissues otherwise within normal limits. No acute abnormality about the orbits.  Mild mucosal thickening within the ethmoidal air cells and sphenoid sinuses, likely chronic. Paranasal sinuses are otherwise clear. No mastoid effusion.  Calvarium intact. Probable remote posttraumatic deformity noted at the left orbital floor.  Mild atrophy with chronic small vessel ischemic disease. 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 No acute large vessel territory infarct. No intracranial hemorrhage. No mass lesion, midline shift or mass effect. No hydrocephalus. No extra-axial fluid collection.  IMPRESSION: 1. No acute intracranial process. 2. Small scalp contusion/laceration at the right forehead. 3. Cerebral atrophy with mild chronic small vessel ischemic disease.   Electronically  Signed By: Jeannine Boga M.D. On: 02/11/2016 07:03          Critical Care performed: CRITICAL CARE Performed by: Gregor Hams   Total critical care time: 30 minutes  Critical care time was exclusive of separately billable procedures and treating other patients.  Critical care was necessary to treat or prevent imminent or life-threatening deterioration.  Critical care was time spent personally by me on the following activities: development of treatment plan with patient and/or surrogate as well as nursing, discussions with consultants, evaluation of patient's response to treatment, examination of  patient, obtaining history from patient or surrogate, ordering and performing treatments and interventions, ordering and review of laboratory studies, ordering and review of radiographic studies, pulse oximetry and re-evaluation of patient's condition.   ____________________________________________   INITIAL IMPRESSION / ASSESSMENT AND PLAN / ED COURSE  Pertinent labs & imaging results that were available during my care of the patient were reviewed by me and considered in my medical decision making (see chart for details).  Review of the patient's laboratory data revealed a hemoglobin of 14 last March hemoglobin today 10. Take Indocin a consideration as well as a picture from EMS concern for considerable lower GI bleed. Patient received 2 units of normal saline on presentation to the emergency department and type and screen for packed red blood cells. Patient discussed with Dr. Earleen Newport for hospital admission for further evaluation and management.  ____________________________________________   FINAL CLINICAL IMPRESSION(S) / ED DIAGNOSES  Final diagnoses:  Gastrointestinal hemorrhage associated with anorectal source      Gregor Hams, MD 02/11/16 [phone]

## 2016-02-11 NOTE — Progress Notes (Addendum)
Pt leaving for EGD.  Centralized telemetry informed.  Transported in bed by orderly.  Pt said his right shoulder hurts and he can barely lift it.  Dr Leslye Peer notified and will order and x ray

## 2016-02-11 NOTE — Transfer of Care (Signed)
Immediate Anesthesia Transfer of Care Note  Patient: Craig Maldonado  Procedure(s) Performed: Procedure(s): ESOPHAGOGASTRODUODENOSCOPY (EGD) WITH PROPOFOL (N/A)  Patient Location: PACU and Endoscopy Unit  Anesthesia Type:General  Level of Consciousness: awake, alert  and oriented  Airway & Oxygen Therapy: Patient Spontanous Breathing and Patient connected to nasal cannula oxygen  Post-op Assessment: Report given to RN and Post -op Vital signs reviewed and stable  Post vital signs: Reviewed and stable  Last Vitals:  Filed Vitals:   02/11/16 1427 02/11/16 1557  BP: 140/88   Pulse: 92 89  Temp: 36.9 C 36.5 C  Resp: 20 22    Complications: No apparent anesthesia complications

## 2016-02-11 NOTE — Op Note (Signed)
[facility] Gastroenterology Patient Name: Craig Maldonado Procedure Date: 02/11/2016 3:25 PM MRN: [id number] Account #: [id number] Date of Birth: 05/22/1951 Admit Type: Inpatient Age: 65 Room: [facility] Gastroenterology ENDO ROOM 4 Gender: Male Note Status: Finalized Procedure:            Upper GI endoscopy Indications:          Hematochezia Providers:            Lucilla Lame, MD Referring MD:         Arlis Porta, MD (Referring MD) Medicines:            Propofol per Anesthesia Complications:        No immediate complications. Procedure:            Pre-Anesthesia Assessment:                       - Prior to the procedure, a History and Physical was                        performed, and patient medications and allergies were                        reviewed. The patient's tolerance of previous                        anesthesia was also reviewed. The risks and benefits of                        the procedure and the sedation options and risks were                        discussed with the patient. All questions were                        answered, and informed consent was obtained. Prior                        Anticoagulants: The patient has taken no previous                        anticoagulant or antiplatelet agents. ASA Grade                        Assessment: II - A patient with mild systemic disease.                        After reviewing the risks and benefits, the patient was                        deemed in satisfactory condition to undergo the                        procedure.                       After obtaining informed consent, the endoscope was                        passed under direct vision. Throughout the procedure,  the patient's blood pressure, pulse, and oxygen                        saturations were monitored continuously. The Endoscope                        was introduced through the mouth, and advanced to the   second part of duodenum. The upper GI endoscopy was                        accomplished without difficulty. The patient tolerated                        the procedure well. Findings:      Grade I varices were found in the lower third of the esophagus.      Few superficial gastric ulcers with no stigmata of bleeding were found       in the gastric antrum.      Localized mild inflammation was found in the duodenal bulb.      Moderate portal hypertensive gastropathy was found in the entire       examined stomach. Impression:           - Grade I esophageal varices.                       - Gastric ulcers with no stigmata of bleeding.                       - Duodenitis.                       - Portal hypertensive gastropathy.                       - No specimens collected. Recommendation:       - Return patient to hospital ward for ongoing care. Procedure Code(s):    --- Professional ---                       [phone], Esophagogastroduodenoscopy, flexible, transoral;                        diagnostic, including collection of specimen(s) by                        brushing or washing, when performed (separate procedure) Diagnosis Code(s):    --- Professional ---                       K92.1, Melena (includes Hematochezia)                       I85.00, Esophageal varices without bleeding                       K25.9, Gastric ulcer, unspecified as acute or chronic,                        without hemorrhage or perforation                       K29.80, Duodenitis without bleeding  K76.6, Portal hypertension                       K31.89, Other diseases of stomach and duodenum CPT copyright 2016 American Medical Association. All rights reserved. The codes documented in this report are preliminary and upon coder review may  be revised to meet current compliance requirements. Lucilla Lame, MD 02/11/2016 3:54:41 PM This report has been signed electronically. Number of Addenda: 0 Note  Initiated On: 02/11/2016 3:25 PM      [facility]

## 2016-02-12 ENCOUNTER — Inpatient Hospital Stay: Payer: Medicare Other | Admitting: Anesthesiology

## 2016-02-12 ENCOUNTER — Encounter: Payer: Self-pay | Admitting: Gastroenterology

## 2016-02-12 ENCOUNTER — Encounter
Admission: EM | Disposition: A | Discharge status: Home / Self Care | Payer: Self-pay | Source: Home / Self Care | Attending: Internal Medicine

## 2016-02-12 HISTORY — PX: COLONOSCOPY WITH PROPOFOL: SHX5780

## 2016-02-12 LAB — BASIC METABOLIC PANEL
ANION GAP: 5 (ref 5–15)
BUN: 7 mg/dL (ref 6–20)
CO2: 22 mmol/L (ref 22–32)
Calcium: 7.8 mg/dL — ABNORMAL LOW (ref 8.9–10.3)
Chloride: 113 mmol/L — ABNORMAL HIGH (ref 101–111)
Creatinine, Ser: 0.68 mg/dL (ref 0.61–1.24)
GFR calc Af Amer: >60 mL/min (ref >60)
GFR calc non Af Amer: >60 mL/min (ref >60)
GLUCOSE: 98 mg/dL (ref 65–99)
Potassium: 3.5 mmol/L (ref 3.5–5.1)
Sodium: 140 mmol/L (ref 135–145)

## 2016-02-12 LAB — CBC
HCT: 26.8 % — ABNORMAL LOW (ref 40.0–52.0)
HEMOGLOBIN: 9 g/dL — AB (ref 13.0–18.0)
MCH: 31.1 pg (ref 26.0–34.0)
MCHC: 33.7 g/dL (ref 32.0–36.0)
MCV: 92.5 fL (ref 80.0–100.0)
Platelets: 130 10*3/uL — ABNORMAL LOW (ref 150–440)
RBC: 2.9 MIL/uL — ABNORMAL LOW (ref 4.40–5.90)
RDW: 15 % — AB (ref 11.5–14.5)
WBC: 9 10*3/uL (ref 3.8–10.6)

## 2016-02-12 LAB — HEMOGLOBIN
HEMOGLOBIN: 8.9 g/dL — AB (ref 13.0–18.0)
HEMOGLOBIN: 8.9 g/dL — AB (ref 13.0–18.0)
HEMOGLOBIN: 8.7 g/dL — AB (ref 13.0–18.0)

## 2016-02-12 LAB — GLUCOSE, CAPILLARY
GLUCOSE-CAPILLARY: 116 mg/dL — AB (ref 65–99)
Glucose-Capillary: 129 mg/dL — ABNORMAL HIGH (ref 65–99)
Glucose-Capillary: 160 mg/dL — ABNORMAL HIGH (ref 65–99)
Glucose-Capillary: 100 mg/dL — ABNORMAL HIGH (ref 65–99)

## 2016-02-12 LAB — HEMOGLOBIN A1C: HEMOGLOBIN A1C: 5.2 % (ref 4.0–6.0)

## 2016-02-12 SURGERY — COLONOSCOPY WITH PROPOFOL
Anesthesia: General

## 2016-02-12 SURGERY — COLONOSCOPY
Anesthesia: Monitor Anesthesia Care

## 2016-02-12 MED ORDER — PROPOFOL 500 MG/50ML IV EMUL
INTRAVENOUS | Status: DC | PRN
  Administered 2016-02-12: 100 ug/kg/min via INTRAVENOUS

## 2016-02-12 MED ORDER — LACTATED RINGERS IV SOLN
INTRAVENOUS | Status: DC | PRN
  Administered 2016-02-12 (×2): via INTRAVENOUS

## 2016-02-12 MED ORDER — PROPOFOL 10 MG/ML IV BOLUS
INTRAVENOUS | Status: DC | PRN
  Administered 2016-02-12: 50 mg via INTRAVENOUS

## 2016-02-12 NOTE — Progress Notes (Signed)
[place] [facility] [place] NAME: Craig Maldonado    MR#:  [id number]  DATE OF BIRTH:  Jul 20, 1951  SUBJECTIVE:  CHIEF COMPLAINT:  Patient is resting comfortably. Had EGD yesterday. No other episodes of GI bleed. Denies any abdominal pain. I'm waiting for colonoscopy.  REVIEW OF SYSTEMS:  CONSTITUTIONAL: No fever, fatigue or weakness.  EYES: No blurred or double vision.  EARS, NOSE, AND THROAT: No tinnitus or ear pain.  RESPIRATORY: No cough, shortness of breath, wheezing or hemoptysis.  CARDIOVASCULAR: No chest pain, orthopnea, edema.  GASTROINTESTINAL: No nausea, vomiting, diarrhea or abdominal pain.  GENITOURINARY: No dysuria, hematuria.  ENDOCRINE: No polyuria, nocturia,  HEMATOLOGY: No anemia, easy bruising or bleeding SKIN: No rash or lesion. MUSCULOSKELETAL: No joint pain or arthritis.   NEUROLOGIC: No tingling, numbness, weakness.  PSYCHIATRY: No anxiety or depression.   DRUG ALLERGIES:   Allergies  Allergen Reactions  . Codeine Nausea And Vomiting    VITALS:  Blood pressure 147/76, pulse 69, temperature 97.7 F (36.5 C), temperature source Oral, resp. rate 16, height 5\' 11"  (1.803 m), weight 100.88 kg (222 lb 6.4 oz), SpO2 99 %.  PHYSICAL EXAMINATION:  GENERAL:  65 y.o.-year-old patient lying in the bed with no acute distress.  EYES: Pupils equal, round, reactive to light and accommodation. No scleral icterus. Extraocular muscles intact.  HEENT: Head atraumatic, normocephalic. Oropharynx and nasopharynx clear.  NECK:  Supple, no jugular venous distention. No thyroid enlargement, no tenderness.  LUNGS: Normal breath sounds bilaterally, no wheezing, rales,rhonchi or crepitation. No use of accessory muscles of respiration.  CARDIOVASCULAR: S1, S2 normal. No murmurs, rubs, or gallops.  ABDOMEN: Soft, nontender, nondistended. Bowel sounds present. No organomegaly or mass.  EXTREMITIES: No pedal edema, cyanosis, or clubbing.   NEUROLOGIC: Cranial nerves II through XII are intact. Muscle strength 5/5 in all extremities. Sensation intact. Gait not checked.  PSYCHIATRIC: The patient is alert and oriented x 3.  SKIN: No obvious rash, lesion, or ulcer.    LABORATORY PANEL:   CBC  Recent Labs Lab 02/12/16 0247 02/12/16 1259  WBC 9.0  --   HGB 9.0* 8.9*  HCT 26.8*  --   PLT 130*  --    ------------------------------------------------------------------------------------------------------------------  Chemistries   Recent Labs Lab 02/11/16 0606 02/12/16 0247  NA 137 140  K 3.6 3.5  CL 109 113*  CO2 23 22  GLUCOSE 232* 98  BUN 9 7  CREATININE 0.82 0.68  CALCIUM 8.0* 7.8*  AST 36  --   ALT 23  --   ALKPHOS 103  --   BILITOT 0.3  --    ------------------------------------------------------------------------------------------------------------------  Cardiac Enzymes No results for input(s): TROPONINI in the last 168 hours. ------------------------------------------------------------------------------------------------------------------  RADIOLOGY:  Dg Shoulder Right  02/11/2016  CLINICAL DATA:  Inpatient fell today while going to the bathroom, complains of right shoulder pain, knot on shoulder, laceration to face, unable to lift right arm EXAM: RIGHT SHOULDER - 2+ VIEW COMPARISON:  Right shoulder MRI, 08/27/2013 FINDINGS: No fracture. No bone lesion. Glenohumeral joint is normally spaced and aligned. Mild AC joint osteoarthritis. Soft tissues are unremarkable. IMPRESSION: No fracture or dislocation. Electronically Signed   By: Lajean Manes M.D.   On: 02/11/2016 19:20   Ct Head Wo Contrast  02/11/2016  CLINICAL DATA:  Initial evaluation for acute syncope with forehead trauma. EXAM: CT HEAD WITHOUT CONTRAST TECHNIQUE: Contiguous axial images were obtained from the base of the skull through the vertex without intravenous contrast.  COMPARISON:  Prior study from 07/19/2014. FINDINGS: Small contusion at  the right forehead present. Scalp soft tissues otherwise within normal limits. No acute abnormality about the orbits. Mild mucosal thickening within the ethmoidal air cells and sphenoid sinuses, likely chronic. Paranasal sinuses are otherwise clear. No mastoid effusion. Calvarium intact. Probable remote posttraumatic deformity noted at the left orbital floor. Mild atrophy with chronic small vessel ischemic disease. 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 No acute large vessel territory infarct. No intracranial hemorrhage. No mass lesion, midline shift or mass effect. No hydrocephalus. No extra-axial fluid collection. IMPRESSION: 1. No acute intracranial process. 2. Small scalp contusion/laceration at the right forehead. 3. Cerebral atrophy with mild chronic small vessel ischemic disease. Electronically Signed   By: Jeannine Boga M.D.   On: 02/11/2016 07:03   Ct Abdomen Pelvis W Contrast  02/11/2016  CLINICAL DATA:  Fall, anemia, rectal bleeding EXAM: CT ABDOMEN AND PELVIS WITH CONTRAST TECHNIQUE: Multidetector CT imaging of the abdomen and pelvis was performed using the standard protocol following bolus administration of intravenous contrast. CONTRAST:  100 cc Isovue 370 COMPARISON:  12/31/2012 FINDINGS: Lower chest: Atherosclerosis of the lower thoracic aorta. Coronary atherosclerosis noted as well. Calcifications of the mitral and aortic valves. Normal heart size. No pericardial or pleural effusion. Right lower chest demonstrates pleural thickening with small punctate pleural calcifications, suspect sequelae from prior inflammation/ infection or trauma. Degenerative changes noted of the thoracic spine with large osteophytes on the right Hepatobiliary: Micro nodular hepatic surface compatible with background cirrhosis. Small 6 mm hepatic dome hypodensity, image 13 suspect small cyst. Hypertrophy noted of the left lobe. Hepatic and portal veins remain patent. No biliary  dilatation. Gallbladder is collapsed but does contain a small punctate calcified gallstone measuring 3 mm, image 21. Pancreas: No mass, inflammatory changes, or other significant abnormality. Spleen: Within normal limits in size and appearance. Adrenals/Urinary Tract: Normal adrenal glands. Small bilateral hypodense cortical renal cysts, 10 mm or less in size. No renal obstruction or hydronephrosis. No obstructing ureteral calculus. Ureters are symmetric and decompressed bilaterally. Stomach/Bowel: Negative for bowel obstruction, significant dilatation, ileus, or free air. Cecum is high in the right upper quadrant. Normal appendix demonstrated containing contrast, retrocecal in position. left descending colon and sigmoid diverticulosis without acute inflammatory process. No fluid collection or abscess. Negative for ascites. Vascular/Lymphatic: Abdominal atherosclerosis noted. Negative for aneurysm. No occlusive process. No adenopathy. Reproductive: No mass or other significant abnormality. Other: No inguinal adenopathy. Small right inguinal fat containing hernia. Intact abdominal wall. No ventral hernia. Musculoskeletal:  Diffuse degenerative changes of the spine. IMPRESSION: Hepatic cirrhosis and scattered small hepatic hypodensities, suspect small hepatic cysts. Incidental cholelithiasis without biliary dilatation or cholecystitis. Aortic atherosclerosis without aneurysm Diverticulosis without acute inflammatory process small fat containing right inguinal hernia Electronically Signed   By: Jerilynn Mages.  Shick M.D.   On: 02/11/2016 09:22    EKG:   Orders placed or performed during the hospital encounter of 02/11/16  . EKG 12-Lead  . EKG 12-Lead    ASSESSMENT AND PLAN:   1. Acute GI bleed. Likely lower GI bleed with bright red blood per rectum secondary to diverticulosis Colonoscopy has revealed diverticulosis and polyps. EGD with gastric ulcer and esophageal varices but no active bleed.  Patient does have a  history of alcohol.  Case discussed with Dr. Allen Norris gastroenterology , recommending clear liquid diet and advance as tolerated through the weekend if hemoglobin is stable   Serial hemoglobins and transfuse if needed.   2.  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  3. Tobacco abuse smoking cessation counseling done 3 minutes by me. Nicotine patch ordered  4. Alcohol abuse. I will put on [place] protocol.  5. Right shoulder pain we'll get an x-ray 6. Impaired fasting glucose. Check a hemoglobin A1c and put on sliding scale.     All the records are reviewed and case discussed with Care Management/Social Workerr. Management plans discussed with the patient, family and they are in agreement.  CODE STATUS: fc  TOTAL TIME TAKING CARE OF THIS PATIENT: min35 utes.   POSSIBLE D/C IN 2 DAYS, DEPENDING ON CLINICAL CONDITION.   Nicholes Mango M.D on 02/12/2016 at 1:49 PM  Between 7am to 6pm - Pager - [phone] After 6pm go to [email or web address] - password [email or web address] [facility]  Office  [phone]  CC: Primary care physician; Ollen Bowl, MD

## 2016-02-12 NOTE — Anesthesia Preprocedure Evaluation (Signed)
Anesthesia Evaluation  Patient identified by MRN, date of birth, ID band  Reviewed: Allergy & Precautions, NPO status , Patient's Chart, lab work & pertinent test results  Airway Mallampati: II       Dental  (+) Upper Dentures, Lower Dentures   Pulmonary COPD, Current Smoker,     + decreased breath sounds      Cardiovascular Exercise Tolerance: Good hypertension,  Rhythm:Regular Rate:Normal     Neuro/Psych    GI/Hepatic PUD, (+)     substance abuse  alcohol use,   Endo/Other  Morbid obesity  Renal/GU negative Renal ROS     Musculoskeletal   Abdominal (+) + obese,   Peds  Hematology  (+) anemia ,   Anesthesia Other Findings   Reproductive/Obstetrics                             Anesthesia Physical Anesthesia Plan  ASA: III  Anesthesia Plan: General   Post-op Pain Management:    Induction: Intravenous  Airway Management Planned: Natural Airway and Nasal Cannula  Additional Equipment:   Intra-op Plan:   Post-operative Plan:   Informed Consent: I have reviewed the patients History and Physical, chart, labs and discussed the procedure including the risks, benefits and alternatives for the proposed anesthesia with the patient or authorized representative who has indicated his/her understanding and acceptance.     Plan Discussed with: CRNA  Anesthesia Plan Comments:         Anesthesia Quick Evaluation

## 2016-02-12 NOTE — Anesthesia Postprocedure Evaluation (Signed)
Anesthesia Post Note  Patient: Craig Maldonado  Procedure(s) Performed: Procedure(s) (LRB): ESOPHAGOGASTRODUODENOSCOPY (EGD) WITH PROPOFOL (N/A)  Patient location during evaluation: Endoscopy Anesthesia Type: General Level of consciousness: awake and alert Pain management: pain level controlled Vital Signs Assessment: post-procedure vital signs reviewed and stable Respiratory status: spontaneous breathing, nonlabored ventilation, respiratory function stable and patient connected to nasal cannula oxygen Cardiovascular status: blood pressure returned to baseline and stable Postop Assessment: no signs of nausea or vomiting Anesthetic complications: no    Last Vitals:  Filed Vitals:   02/12/16 0420 02/12/16 0729  BP: 129/67 150/79  Pulse: 74 67  Temp: 36.7 C 36.8 C  Resp: 18 16    Last Pain:  Filed Vitals:   02/12/16 0747  PainSc: 8                  Martha Clan

## 2016-02-12 NOTE — Anesthesia Postprocedure Evaluation (Signed)
Anesthesia Post Note  Patient: Craig Maldonado  Procedure(s) Performed: Procedure(s) (LRB): COLONOSCOPY WITH PROPOFOL (N/A)  Patient location during evaluation: PACU Anesthesia Type: General Level of consciousness: awake Pain management: pain level controlled Vital Signs Assessment: post-procedure vital signs reviewed and stable Respiratory status: spontaneous breathing Cardiovascular status: blood pressure returned to baseline Anesthetic complications: no    Last Vitals:  Filed Vitals:   02/12/16 1252 02/12/16 1451  BP: 147/76 116/81  Pulse: 69 71  Temp: 36.5 C   Resp: 16 20    Last Pain:  Filed Vitals:   02/12/16 1452  PainSc: 6                  VAN STAVEREN,Saraia Platner

## 2016-02-12 NOTE — Transfer of Care (Signed)
Immediate Anesthesia Transfer of Care Note  Patient: Craig Maldonado  Procedure(s) Performed: Procedure(s): COLONOSCOPY WITH PROPOFOL (N/A)  Patient Location: PACU  Anesthesia Type:General  Level of Consciousness: sedated  Airway & Oxygen Therapy: Patient Spontanous Breathing and Patient connected to nasal cannula oxygen  Post-op Assessment: Report given to RN and Post -op Vital signs reviewed and stable  Post vital signs: Reviewed  Last Vitals:  Filed Vitals:   02/12/16 0420 02/12/16 0729  BP: 129/67 150/79  Pulse: 74 67  Temp: 36.7 C 36.8 C  Resp: 18 16    Complications: No apparent anesthesia complications

## 2016-02-12 NOTE — Progress Notes (Signed)
Dr Bridgett Larsson paged, Tele called with 1st and 2nd degree heart block rate of 37. Pulse is now 72, BP 161/82. Cardiology consult ordered.

## 2016-02-13 LAB — GLUCOSE, CAPILLARY
GLUCOSE-CAPILLARY: 106 mg/dL — AB (ref 65–99)
GLUCOSE-CAPILLARY: 96 mg/dL (ref 65–99)
GLUCOSE-CAPILLARY: 93 mg/dL (ref 65–99)
Glucose-Capillary: 102 mg/dL — ABNORMAL HIGH (ref 65–99)

## 2016-02-13 LAB — HEPATITIS B CORE ANTIBODY, TOTAL: HEP B C TOTAL AB: NEGATIVE

## 2016-02-13 LAB — HEPATITIS C ANTIBODY: HCV Ab: <0.1 s/co ratio (ref 0.0–0.9)

## 2016-02-13 LAB — HEPATITIS B SURFACE ANTIGEN: Hepatitis B Surface Ag: NEGATIVE

## 2016-02-13 LAB — HEPATITIS B SURFACE ANTIBODY, QUANTITATIVE: Hepatitis B-Post: <3.1 m[IU]/mL — ABNORMAL LOW (ref >9.9)

## 2016-02-13 NOTE — Progress Notes (Signed)
Craig Maldonado is a 65 y.o. male  [id number]  Primary Cardiologist: Neoma Laming Reason for Consultation: Syncope and second-degree and first-degree AV block  HPI: This is a 65 year old white male with a past medical history ofsignificant medical problem presented to the emergency room after passing out. He also was found to have GI bleed with hemoglobin of 10.8 with hemoglobin 14.4 in the past. I was asked to evaluate the patient for possible second degree or first degree AV block. Patient states he was going to the bathroom and passed out. Review of Systems: No chest pain no orthopnea and no PND   History reviewed. No pertinent past medical history.  Medications Prior to Admission  Medication Sig Dispense Refill  . furosemide (LASIX) 40 MG tablet TAKE ONE TABLET BY MOUTH ONCE DAILY---PATIENT NEEDS AN APPOINTMENT FOR OFFICE FOLLOW UP TO GET ANY MORE MEDICATION 10 tablet 0  . spironolactone (ALDACTONE) 25 MG tablet TAKE ONE TABLET BY MOUTH ONCE DAILY (Patient not taking: Reported on 02/11/2016) 10 tablet 0     . folic acid  1 mg Oral Daily  . multivitamin with minerals  1 tablet Oral Daily  . nicotine  14 mg Transdermal Daily  . pantoprazole (PROTONIX) IV  40 mg Intravenous Q12H  . sodium chloride flush  3 mL Intravenous Q12H  . thiamine  100 mg Oral Daily   Or  . thiamine  100 mg Intravenous Daily    Infusions:    Allergies  Allergen Reactions  . Codeine Nausea And Vomiting    Social History   Social History  . Marital Status: Married    Spouse Name: N/A  . Number of Children: N/A  . Years of Education: N/A   Occupational History  . Not on file.   Social History Main Topics  . Smoking status: Current Every Day Smoker -- 0.50 packs/day  . Smokeless tobacco: Not on file  . Alcohol Use: Yes  . Drug Use: No  . Sexual Activity: Not on file   Other Topics Concern  . Not on file   Social History Narrative    Family History  Problem Relation Age of Onset  .  Healthy Mother   . Tuberculosis Father     PHYSICAL EXAM: Filed Vitals:   02/13/16 0732 02/13/16 1213  BP: 134/83 152/77  Pulse: 76 76  Temp: 98.6 F (37 C) 98.4 F (36.9 C)  Resp:  16     Intake/Output Summary (Last 24 hours) at 02/13/16 1339 Last data filed at 02/13/16 1226  Gross per 24 hour  Intake 2046.25 ml  Output   3375 ml  Net -1328.75 ml    General:  Well appearing. No respiratory difficulty HEENT: normal Neck: supple. no JVD. Carotids 2+ bilat; no bruits. No lymphadenopathy or thryomegaly appreciated. Cor: PMI nondisplaced. Regular rate & rhythm. No rubs, gallops or murmurs. Lungs: clear Abdomen: soft, nontender, nondistended. No hepatosplenomegaly. No bruits or masses. Good bowel sounds. Extremities: no cyanosis, clubbing, rash, edema Neuro: alert & oriented x 3, cranial nerves grossly intact. moves all 4 extremities w/o difficulty. Affect pleasant.  [id number] rhythm with no acute EKG changes and no evidence of second degree of first degree AV block.  Results for orders placed or performed during the hospital encounter of 02/11/16 (from the past 24 hour(s))  Hemoglobin     Status: Abnormal   Collection Time: 02/12/16  3:22 PM  Result Value Ref Range   Hemoglobin 8.9 (L) 13.0 - 18.0 g/dL  Glucose,  capillary     Status: Abnormal   Collection Time: 02/12/16  4:12 PM  Result Value Ref Range   Glucose-Capillary 160 (H) 65 - 99 mg/dL  Glucose, capillary     Status: Abnormal   Collection Time: 02/12/16  9:10 PM  Result Value Ref Range   Glucose-Capillary 100 (H) 65 - 99 mg/dL   Comment 1 Notify RN   Hemoglobin     Status: Abnormal   Collection Time: 02/12/16 11:12 PM  Result Value Ref Range   Hemoglobin 8.7 (L) 13.0 - 18.0 g/dL  Glucose, capillary     Status: Abnormal   Collection Time: 02/13/16  7:33 AM  Result Value Ref Range   Glucose-Capillary 102 (H) 65 - 99 mg/dL  Glucose, capillary     Status: Abnormal   Collection Time: 02/13/16 11:42 AM  Result  Value Ref Range   Glucose-Capillary 106 (H) 65 - 99 mg/dL   Dg Shoulder Right  02/11/2016  CLINICAL DATA:  Inpatient fell today while going to the bathroom, complains of right shoulder pain, knot on shoulder, laceration to face, unable to lift right arm EXAM: RIGHT SHOULDER - 2+ VIEW COMPARISON:  Right shoulder MRI, 08/27/2013 FINDINGS: No fracture. No bone lesion. Glenohumeral joint is normally spaced and aligned. Mild AC joint osteoarthritis. Soft tissues are unremarkable. IMPRESSION: No fracture or dislocation. Electronically Signed   By: Lajean Manes M.D.   On: 02/11/2016 19:20     ASSESSMENT AND PLAN: Syncope most likely due to GI bleed with findings of hemoglobin 10.8 from originally 14.4 last year. I don't see any strips in the chart to suggest that the patient has second degree of first degree AV block. EKG appears to be unremarkable. Once she recovers from GI bleed will do Holter monitoring and workup such as stress test. No indication for pacemaker at this time.  Craig Maldonado A oh to the top

## 2016-02-13 NOTE — Progress Notes (Signed)
[facility] at [place] NAME: Erlon Gasbarro    MR#:  [id number]  DATE OF BIRTH:  26-Dec-1950  SUBJECTIVE:  CHIEF COMPLAINT:  Patient is resting comfortably. Had EGD   3/24  and colonoscopy 3/25 and was diagnosed with diverticulosis .denies any other episodes of bleeding following colonoscopy. Resting comfortably. Tolerating liquid diet  REVIEW OF SYSTEMS:  CONSTITUTIONAL: No fever, fatigue or weakness.  EYES: No blurred or double vision.  EARS, NOSE, AND THROAT: No tinnitus or ear pain.  RESPIRATORY: No cough, shortness of breath, wheezing or hemoptysis.  CARDIOVASCULAR: No chest pain, orthopnea, edema.  GASTROINTESTINAL: No nausea, vomiting, diarrhea or abdominal pain.  GENITOURINARY: No dysuria, hematuria.  ENDOCRINE: No polyuria, nocturia,  HEMATOLOGY: No anemia, easy bruising or bleeding SKIN: No rash or lesion. MUSCULOSKELETAL: No joint pain or arthritis.   NEUROLOGIC: No tingling, numbness, weakness.  PSYCHIATRY: No anxiety or depression.   DRUG ALLERGIES:   Allergies  Allergen Reactions  . Codeine Nausea And Vomiting    VITALS:  Blood pressure 134/83, pulse 76, temperature 98.6 F (37 C), temperature source Oral, resp. rate 16, height 5\' 11"  (1.803 m), weight 100.88 kg (222 lb 6.4 oz), SpO2 98 %.  PHYSICAL EXAMINATION:  GENERAL:  65 y.o.-year-old patient lying in the bed with no acute distress.  EYES: Pupils equal, round, reactive to light and accommodation. No scleral icterus. Extraocular muscles intact.  HEENT: Head atraumatic, normocephalic. Oropharynx and nasopharynx clear.  NECK:  Supple, no jugular venous distention. No thyroid enlargement, no tenderness.  LUNGS: Normal breath sounds bilaterally, no wheezing, rales,rhonchi or crepitation. No use of accessory muscles of respiration.  CARDIOVASCULAR: S1, S2 normal. No murmurs, rubs, or gallops.  ABDOMEN: Soft, nontender, nondistended. Bowel sounds present. No  organomegaly or mass.  EXTREMITIES: No pedal edema, cyanosis, or clubbing.  NEUROLOGIC: Cranial nerves II through XII are intact. Muscle strength 5/5 in all extremities. Sensation intact. Gait not checked.  PSYCHIATRIC: The patient is alert and oriented x 3.  SKIN: No obvious rash, lesion, or ulcer.    LABORATORY PANEL:   CBC  Recent Labs Lab 02/12/16 0247  02/12/16 2312  WBC 9.0  --   --   HGB 9.0*  < > 8.7*  HCT 26.8*  --   --   PLT 130*  --   --   < > = values in this interval not displayed. ------------------------------------------------------------------------------------------------------------------  Chemistries   Recent Labs Lab 02/11/16 0606 02/12/16 0247  NA 137 140  K 3.6 3.5  CL 109 113*  CO2 23 22  GLUCOSE 232* 98  BUN 9 7  CREATININE 0.82 0.68  CALCIUM 8.0* 7.8*  AST 36  --   ALT 23  --   ALKPHOS 103  --   BILITOT 0.3  --    ------------------------------------------------------------------------------------------------------------------  Cardiac Enzymes No results for input(s): TROPONINI in the last 168 hours. ------------------------------------------------------------------------------------------------------------------  RADIOLOGY:  Dg Shoulder Right  02/11/2016  CLINICAL DATA:  Inpatient fell today while going to the bathroom, complains of right shoulder pain, knot on shoulder, laceration to face, unable to lift right arm EXAM: RIGHT SHOULDER - 2+ VIEW COMPARISON:  Right shoulder MRI, 08/27/2013 FINDINGS: No fracture. No bone lesion. Glenohumeral joint is normally spaced and aligned. Mild AC joint osteoarthritis. Soft tissues are unremarkable. IMPRESSION: No fracture or dislocation. Electronically Signed   By: Lajean Manes M.D.   On: 02/11/2016 19:20    EKG:   Orders placed or performed during the  hospital encounter of 02/11/16  . EKG 12-Lead  . EKG 12-Lead    ASSESSMENT AND PLAN:   1. Acute GI bleed. 2/2 lower GI bleed with bright red  blood per rectum secondary to diverticulosis Colonoscopy has revealed diverticulosis and polyps. GI is recommending outpatient colonoscopy in 3-5 months for complete polypectomy EGD with gastric ulcer and esophageal varices but no active bleed.  Patient does have a history of alcohol.  Case discussed with  gastroenterology , recommending  advance  diet as tolerated  Serial hemoglobins and transfuse if needed.  Anticipate discharge in a.m. if stable  2. 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  3. Tobacco abuse smoking cessation counseling done 3 minutes by me. Nicotine patch ordered  4. Alcohol abuse. on Ciwa protocol. No withdrawal noticed  5. Right shoulder pain we'll get an x-ray 6. Impaired fasting glucose, A1c 5.2. Ruled out for diabetes mellitus.       All the records are reviewed and case discussed with Care Management/Social Workerr. Management plans discussed with the patient, family and they are in agreement.  CODE STATUS: fc  TOTAL TIME TAKING CARE OF THIS PATIENT: min35 utes.   POSSIBLE D/C IN 2 DAYS, DEPENDING ON CLINICAL CONDITION.   Nicholes Mango M.D on 02/13/2016 at 11:52 AM  Between 7am to 6pm - Pager - [phone] After 6pm go to [email or web address] - password [email or web address] [facility]  Office  [phone]  CC: Primary care physician; Ollen Bowl, MD

## 2016-02-13 NOTE — Consult Note (Signed)
Consultation Follow Up  Referring Provider:     No ref. provider found Primary Care Physician:  Ollen Bowl, MD Primary Gastroenterologist:  Dr. Candace Cruise         Reason for Consultation:     GI bleed  Date of Admission:  02/11/2016 Date of Consultation Follow Up:  02/13/2016         Subjective:  Craig Maldonado is a 65 y.o. male who has a history of alcohol abuse and liver disease who presented with GI bleeding. We performed colonoscopy yesterday which revealed likely diverticular bleeding. He had only one BM after the procedure.   Review of Systems:    All systems reviewed and negative except where noted in HPI.   Physical Exam:  Vital signs in last 24 hours: Temp:  [97.2 F (36.2 C)-98.6 F (37 C)] 98.6 F (37 C) (03/26 0732) Pulse Rate:  [62-83] 76 (03/26 0732) Resp:  [14-20] 16 (03/26 0403) BP: (116-161)/(73-90) 134/83 mmHg (03/26 0732) SpO2:  [96 %-100 %] 98 % (03/26 0732) Last BM Date: 02/12/16 General:   Pleasant, cooperative in NAD Head:  Normocephalic and suture in place on forehead Lungs: Respirations even and unlabored. Lungs clear to auscultation bilaterally.   No wheezes, crackles, or rhonchi.  Heart:  Regular rate and rhythm;  Without murmur, clicks, rubs or gallops Abdomen:  Soft, nondistended, nontender. Normal bowel sounds. No appreciable masses or hepatomegaly.  No rebound or guarding.  Msk:  Symmetrical without gross deformities.  Strength  Extremities:  Without edema, cyanosis or clubbing. Neurologic:  Alert and oriented x3;  grossly normal neurologically.  LAB RESULTS:  Recent Labs  02/11/16 0606  02/12/16 0247 02/12/16 1259 02/12/16 1522 02/12/16 2312  WBC 10.3  --  9.0  --   --   --   HGB 10.8*  < > 9.0* 8.9* 8.9* 8.7*  HCT 32.3*  --  26.8*  --   --   --   PLT 181  --  130*  --   --   --   < > = values in this interval not displayed. BMET  Recent Labs  02/11/16 0606 02/12/16 0247  NA 137 140  K 3.6 3.5  CL 109 113*  CO2 23 22  GLUCOSE 232* 98   BUN 9 7  CREATININE 0.82 0.68  CALCIUM 8.0* 7.8*   LFT  Recent Labs  02/11/16 0606  PROT 6.2*  ALBUMIN 3.1*  AST 36  ALT 23  ALKPHOS 103  BILITOT 0.3   PT/INR  Recent Labs  02/11/16 0606  LABPROT 16.1*  INR 1.28    STUDIES: Dg Shoulder Right  02/11/2016  CLINICAL DATA:  Inpatient fell today while going to the bathroom, complains of right shoulder pain, knot on shoulder, laceration to face, unable to lift right arm EXAM: RIGHT SHOULDER - 2+ VIEW COMPARISON:  Right shoulder MRI, 08/27/2013 FINDINGS: No fracture. No bone lesion. Glenohumeral joint is normally spaced and aligned. Mild AC joint osteoarthritis. Soft tissues are unremarkable. IMPRESSION: No fracture or dislocation. Electronically Signed   By: Lajean Manes M.D.   On: 02/11/2016 19:20      Impression / Plan:   Craig Maldonado is a 65 y.o. y/o male with who comes in with rectal bleeding and passing of clots. He has now undergone EGD/Colonsocopy, with colonoscopy revealing likely diverticular source of bleeding. His Hct seems to have stabilized at this point, but would monitor today.  He had only 1 BM after his procedure yesterday morning (1 in  24 hours), which also seems to indicate that bleeding has stopped. I have recommended advancing his diet.  I would also recommend that he repeats his colonoscopy in 3-6 months due to inadequate prep and visualized polyps that could not be removed at the time of colonoscopy yesterday due to bleeding risk. He expressed understanding.  Thank you for involving me in the care of this patient.      LOS: 2 days   Fredonia Highland, MD  02/13/2016, 10:24 AM

## 2016-02-14 ENCOUNTER — Inpatient Hospital Stay
Admit: 2016-02-14 | Discharge: 2016-02-14 | Disposition: A | Discharge status: Home / Self Care | Payer: Medicare Other | Attending: Cardiovascular Disease | Admitting: Cardiovascular Disease

## 2016-02-14 DIAGNOSIS — K625 Hemorrhage of anus and rectum: Secondary | ICD-10-CM

## 2016-02-14 LAB — GLUCOSE, CAPILLARY
GLUCOSE-CAPILLARY: 154 mg/dL — AB (ref 65–99)
Glucose-Capillary: 104 mg/dL — ABNORMAL HIGH (ref 65–99)
Glucose-Capillary: 174 mg/dL — ABNORMAL HIGH (ref 65–99)
Glucose-Capillary: 148 mg/dL — ABNORMAL HIGH (ref 65–99)

## 2016-02-14 LAB — CBC
HCT: 27.3 % — ABNORMAL LOW (ref 40.0–52.0)
Hemoglobin: 9.4 g/dL — ABNORMAL LOW (ref 13.0–18.0)
MCH: 32.5 pg (ref 26.0–34.0)
MCHC: 34.4 g/dL (ref 32.0–36.0)
MCV: 94.6 fL (ref 80.0–100.0)
PLATELETS: 113 10*3/uL — AB (ref 150–440)
RBC: 2.88 MIL/uL — AB (ref 4.40–5.90)
RDW: 15 % — ABNORMAL HIGH (ref 11.5–14.5)
WBC: 7.9 10*3/uL (ref 3.8–10.6)

## 2016-02-14 LAB — HEMOGLOBIN AND HEMATOCRIT, BLOOD
HEMATOCRIT: 25.8 % — AB (ref 40.0–52.0)
HEMOGLOBIN: 8.8 g/dL — AB (ref 13.0–18.0)

## 2016-02-14 MED ORDER — DOCUSATE SODIUM 100 MG PO CAPS: 100.0000 mg | ORAL_CAPSULE | Freq: Two times a day (BID) | ORAL | Status: DC

## 2016-02-14 MED ORDER — FERROUS SULFATE 324 (65 FE) MG PO TBEC: 1.0000 | DELAYED_RELEASE_TABLET | Freq: Two times a day (BID) | ORAL | Status: DC

## 2016-02-14 MED ORDER — THIAMINE HCL 100 MG PO TABS: 100.0000 mg | ORAL_TABLET | Freq: Every day | ORAL | Status: DC

## 2016-02-14 MED ORDER — ACETAMINOPHEN 325 MG PO TABS: 650.0000 mg | ORAL_TABLET | Freq: Four times a day (QID) | ORAL | Status: DC | PRN

## 2016-02-14 MED ORDER — NICOTINE 14 MG/24HR TD PT24: 14.0000 mg | MEDICATED_PATCH | Freq: Every day | TRANSDERMAL | Status: DC

## 2016-02-14 MED ORDER — OXYCODONE HCL 5 MG PO TABS: 5.0000 mg | ORAL_TABLET | Freq: Four times a day (QID) | ORAL | Status: DC | PRN

## 2016-02-14 MED ORDER — ADULT MULTIVITAMIN W/MINERALS CH: 1.0000 | ORAL_TABLET | Freq: Every day | ORAL | Status: DC

## 2016-02-14 MED ORDER — FOLIC ACID 1 MG PO TABS: 1.0000 mg | ORAL_TABLET | Freq: Every day | ORAL | Status: DC

## 2016-02-14 MED ORDER — ONDANSETRON HCL 4 MG/2ML IJ SOLN
4.0000 mg | Freq: Four times a day (QID) | INTRAMUSCULAR | Status: DC | PRN
  Administered 2016-02-14 – 2016-02-18 (×4): 4 mg via INTRAVENOUS
  Filled 2016-02-14 (×4): qty 2

## 2016-02-14 NOTE — Progress Notes (Signed)
[facility] at [place] NAME: Craig Maldonado    MR#:  [id number]  DATE OF BIRTH:  07-02-1951  SUBJECTIVE:  CHIEF COMPLAINT:  Patient is resting comfortably refused PT eval of rt shoulder, as he is in pain. RN reported pt having large BM with large amount of blood just prior to d/c , GI notified and d/c suspended  3/24  and colonoscopy 3/25 and was diagnosed with diverticulosis . REVIEW OF SYSTEMS:  CONSTITUTIONAL: No fever, fatigue or weakness.  EYES: No blurred or double vision.  EARS, NOSE, AND THROAT: No tinnitus or ear pain.  RESPIRATORY: No cough, shortness of breath, wheezing or hemoptysis.  CARDIOVASCULAR: No chest pain, orthopnea, edema.  GASTROINTESTINAL: No nausea, vomiting, diarrhea or abdominal pain.  GENITOURINARY: No dysuria, hematuria.  ENDOCRINE: No polyuria, nocturia,  HEMATOLOGY: No anemia, easy bruising or bleeding SKIN: No rash or lesion. MUSCULOSKELETAL: No joint pain or arthritis.   NEUROLOGIC: No tingling, numbness, weakness.  PSYCHIATRY: No anxiety or depression.   DRUG ALLERGIES:   Allergies  Allergen Reactions  . Codeine Nausea And Vomiting    VITALS:  Blood pressure 117/70, pulse 86, temperature 98.2 F (36.8 C), temperature source Oral, resp. rate 18, height 5\' 11"  (1.803 m), weight 100.88 kg (222 lb 6.4 oz), SpO2 98 %.  PHYSICAL EXAMINATION:  GENERAL:  65 y.o.-year-old patient lying in the bed with no acute distress.  EYES: Pupils equal, round, reactive to light and accommodation. No scleral icterus. Extraocular muscles intact.  HEENT: Head atraumatic, normocephalic. Oropharynx and nasopharynx clear.  NECK:  Supple, no jugular venous distention. No thyroid enlargement, no tenderness.  LUNGS: Normal breath sounds bilaterally, no wheezing, rales,rhonchi or crepitation. No use of accessory muscles of respiration.  CARDIOVASCULAR: S1, S2 normal. No murmurs, rubs, or gallops.  ABDOMEN: Soft, nontender,  nondistended. Bowel sounds present. No organomegaly or mass.  EXTREMITIES: No pedal edema, cyanosis, or clubbing.  NEUROLOGIC: Cranial nerves II through XII are intact. Muscle strength 5/5 in all extremities. Sensation intact. Gait not checked.  PSYCHIATRIC: The patient is alert and oriented x 3.  SKIN: No obvious rash, lesion, or ulcer.    LABORATORY PANEL:   CBC  Recent Labs Lab 02/14/16 0409 02/14/16 1113  WBC 7.9  --   HGB 9.4* 8.8*  HCT 27.3* 25.8*  PLT 113*  --    ------------------------------------------------------------------------------------------------------------------  Chemistries   Recent Labs Lab 02/11/16 0606 02/12/16 0247  NA 137 140  K 3.6 3.5  CL 109 113*  CO2 23 22  GLUCOSE 232* 98  BUN 9 7  CREATININE 0.82 0.68  CALCIUM 8.0* 7.8*  AST 36  --   ALT 23  --   ALKPHOS 103  --   BILITOT 0.3  --    ------------------------------------------------------------------------------------------------------------------  Cardiac Enzymes No results for input(s): TROPONINI in the last 168 hours. ------------------------------------------------------------------------------------------------------------------  RADIOLOGY:  No results found.  EKG:   Orders placed or performed during the hospital encounter of 02/11/16  . EKG 12-Lead  . EKG 12-Lead    ASSESSMENT AND PLAN:   1. Acute GI bleed. 2/2 lower GI bleed with bright red blood per rectum secondary to diverticulosis Pt had another episode of bloody BM just prior to d/c , d/c suspended Dr. Vira Agar notified , will see the patient Colonoscopy has revealed diverticulosis and polyps. GI is recommending outpatient colonoscopy in 3-5 months for complete polypectomy EGD with gastric ulcer and esophageal varices but no active bleed.  Patient does have  a history of alcohol.  Serial hemoglobins and transfuse if needed.  Anticipate discharge in a.m. if stable  2. 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 Likely  secondary to alcohol.  3. Tobacco abuse smoking cessation counseling done 3 minutes by me. Nicotine patch ordered  4. Alcohol abuse. on Ciwa protocol. No withdrawal noticed  5. Right shoulder pain, neg x-ray. Pt refused PT 6. Impaired fasting glucose, A1c 5.2. Ruled out for diabetes mellitus.       All the records are reviewed and case discussed with Care Management/Social Workerr. Management plans discussed with the patient, family and they are in agreement.  CODE STATUS: fc  TOTAL TIME TAKING CARE OF THIS PATIENT: min35 utes.   POSSIBLE D/C IN 1-2 DAYS, DEPENDING ON CLINICAL CONDITION.   Nicholes Mango M.D on 02/14/2016 at 5:38 PM  Between 7am to 6pm - Pager - [phone] After 6pm go to [email or web address] - password [email or web address] [facility]  Office  [phone]  CC: Primary care physician; Ollen Bowl, MD

## 2016-02-14 NOTE — Progress Notes (Signed)
Patient complaining of nausea. Dr Margaretmary Eddy notified, order for zofran placed.

## 2016-02-14 NOTE — Discharge Summary (Signed)
[facility] [place] [facility] NAME: Craig Maldonado    MR#:  [id number]  DATE OF BIRTH:  1951/04/17  DATE OF ADMISSION:  02/11/2016 ADMITTING PHYSICIAN: Loletha Grayer, MD  DATE OF DISCHARGE: 02/14/2016 PRIMARY CARE PHYSICIAN: Ollen Bowl, MD    ADMISSION DIAGNOSIS:  Gastrointestinal hemorrhage associated with anorectal source [K62.5]  DISCHARGE DIAGNOSIS:  Lower GI bleed secondary to diverticulosis Colon polyps Syncope  SECONDARY DIAGNOSIS:  History reviewed. No pertinent past medical history.  HOSPITAL COURSE:   1. Acute GI bleed. 2/2 lower GI bleed with bright red blood per rectum secondary to diverticulosis Colonoscopy has revealed diverticulosis and polyps. GI is recommending outpatient colonoscopy in 3-5 months for complete polypectomy EGD with gastric ulcer and esophageal varices but no active bleed. Patient does have a history of alcohol.  Case discussed with gastroenterology , advanced diet as tolerated . Patient tolerated well  hemoglobin is stable at 9.4 today.  Will discharge patient home   #Syncope secondary to lower GI bleed Patient was seen and evaluated by cardiology Dr. Humphrey Rolls who is recommending outpatient Myoview stress test and possible Holter monitoring  #. 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  #. Tobacco abuse smoking cessation counseling done 3 minutes by me. Nicotine patch ordered  #. Alcohol abuse. on [place] protocol. No withdrawal noticed  #. Right shoulder pain from syncope. No fracture on x-ray. Patient is refusing physical therapy. Wants to go home. Tylenol as needed   #. Impaired fasting glucose, A1c 5.2. Ruled out for diabetes mellitus.   DISCHARGE CONDITIONS:   Fair  CONSULTS OBTAINED:  Treatment Team:  Lucilla Lame, MD Dionisio David, MD   PROCEDURES EGD and colonoscopy  DRUG ALLERGIES:   Allergies  Allergen Reactions  . Codeine Nausea And Vomiting     DISCHARGE MEDICATIONS:   Current Discharge Medication List    START taking these medications   Details  acetaminophen (TYLENOL) 325 MG tablet Take 2 tablets (650 mg total) by mouth every 6 (six) hours as needed for mild pain (or Fever >/= 101).    docusate sodium (COLACE) 100 MG capsule Take 1 capsule (100 mg total) by mouth 2 (two) times daily. Qty: 10 capsule, Refills: 0    ferrous sulfate 324 (65 Fe) MG TBEC Take 1 tablet (325 mg total) by mouth 2 (two) times daily.    folic acid (FOLVITE) 1 MG tablet Take 1 tablet (1 mg total) by mouth daily.    Multiple Vitamin (MULTIVITAMIN WITH MINERALS) TABS tablet Take 1 tablet by mouth daily.    nicotine (NICODERM CQ - DOSED IN MG/24 HOURS) 14 mg/24hr patch Place 1 patch (14 mg total) onto the skin daily. Qty: 28 patch, Refills: 0    oxyCODONE (OXY IR/ROXICODONE) 5 MG immediate release tablet Take 1 tablet (5 mg total) by mouth every 6 (six) hours as needed for moderate pain. Qty: 30 tablet, Refills: 0    thiamine 100 MG tablet Take 1 tablet (100 mg total) by mouth daily.      CONTINUE these medications which have NOT CHANGED   Details  furosemide (LASIX) 40 MG tablet TAKE ONE TABLET BY MOUTH ONCE DAILY---PATIENT NEEDS AN APPOINTMENT FOR OFFICE FOLLOW UP TO GET ANY MORE MEDICATION Qty: 10 tablet, Refills: 0    spironolactone (ALDACTONE) 25 MG tablet TAKE ONE TABLET BY MOUTH ONCE DAILY Qty: 10 tablet, Refills: 0         DISCHARGE INSTRUCTIONS:   Activity as  tolerated Diet low-salt Follow-up with primary care physician in a week Follow-up with cardiology Dr. Humphrey Rolls in 3 days Follow-up with gastroenterology in 1 month   DIET:  Low-salt  DISCHARGE CONDITION:  Fair  ACTIVITY:  Activity as tolerated  OXYGEN:  Home Oxygen: No.   Oxygen Delivery: room air  DISCHARGE LOCATION:  home   If you experience worsening of your admission symptoms, develop shortness of breath, life threatening emergency, suicidal or  homicidal thoughts you must seek medical attention immediately by calling 911 or calling your MD immediately  if symptoms less severe.  You Must read complete instructions/literature along with all the possible adverse reactions/side effects for all the Medicines you take and that have been prescribed to you. Take any new Medicines after you have completely understood and accpet all the possible adverse reactions/side effects.   Please note  You were cared for by a hospitalist during your hospital stay. If you have any questions about your discharge medications or the care you received while you were in the hospital after you are discharged, you can call the unit and asked to speak with the hospitalist on call if the hospitalist that took care of you is not available. Once you are discharged, your primary care physician will handle any further medical issues. Please note that NO REFILLS for any discharge medications will be authorized once you are discharged, as it is imperative that you return to your primary care physician (or establish a relationship with a primary care physician if you do not have one) for your aftercare needs so that they can reassess your need for medications and monitor your lab values.     Today  Chief Complaint  Patient presents with  . GI Bleeding   Patient is feeling fine. Denies any abdominal pain or any other episodes of GI bleed. Reporting right shoulder pain from fall and refusing physical therapy  ROS: CONSTITUTIONAL: Denies fevers, chills. Denies any fatigue, weakness.  EYES: Denies blurry vision, double vision, eye pain. EARS, NOSE, THROAT: Denies tinnitus, ear pain, hearing loss. RESPIRATORY: Denies cough, wheeze, shortness of breath.  CARDIOVASCULAR: Denies chest pain, palpitations, edema.  GASTROINTESTINAL: Denies nausea, vomiting, diarrhea, abdominal pain. Denies bright red blood per rectum. GENITOURINARY: Denies dysuria, hematuria. ENDOCRINE: Denies  nocturia or thyroid problems. HEMATOLOGIC AND LYMPHATIC: Denies easy bruising or bleeding. SKIN: Denies rash or lesion. MUSCULOSKELETAL: Reporting right shoulder pain. Denies pain in neck, back,  knees, hips or arthritic symptoms.  NEUROLOGIC: Denies paralysis, paresthesias.  PSYCHIATRIC: Denies anxiety or depressive symptoms.   VITAL SIGNS:  Blood pressure 159/73, pulse 68, temperature 98.5 F (36.9 C), temperature source Oral, resp. rate 18, height 5\' 11"  (1.803 m), weight 100.88 kg (222 lb 6.4 oz), SpO2 99 %.  I/O:    Intake/Output Summary (Last 24 hours) at 02/14/16 0950 Last data filed at 02/14/16 0720  Gross per 24 hour  Intake 1521.25 ml  Output   2675 ml  Net -1153.75 ml    PHYSICAL EXAMINATION:  GENERAL:  65 y.o.-year-old patient lying in the bed with no acute distress.  EYES: Pupils equal, round, reactive to light and accommodation. No scleral icterus. Extraocular muscles intact.  HEENT: Head atraumatic, normocephalic. Oropharynx and nasopharynx clear.  NECK:  Supple, no jugular venous distention. No thyroid enlargement, no tenderness.  LUNGS: Normal breath sounds bilaterally, no wheezing, rales,rhonchi or crepitation. No use of accessory muscles of respiration.  CARDIOVASCULAR: S1, S2 normal. No murmurs, rubs, or gallops.  ABDOMEN: Soft, non-tender, non-distended. Bowel sounds  present. No organomegaly or mass.  EXTREMITIES: Right shoulder is tender, range of motion is limited. No pedal edema, cyanosis, or clubbing.  NEUROLOGIC: Cranial nerves II through XII are intact. Muscle strength 5/5 in all extremities. Sensation intact. Gait not checked.  PSYCHIATRIC: The patient is alert and oriented x 3.  SKIN: No obvious rash, lesion, or ulcer.   DATA REVIEW:   CBC  Recent Labs Lab 02/14/16 0409  WBC 7.9  HGB 9.4*  HCT 27.3*  PLT 113*    Chemistries   Recent Labs Lab 02/11/16 0606 02/12/16 0247  NA 137 140  K 3.6 3.5  CL 109 113*  CO2 23 22  GLUCOSE 232*  98  BUN 9 7  CREATININE 0.82 0.68  CALCIUM 8.0* 7.8*  AST 36  --   ALT 23  --   ALKPHOS 103  --   BILITOT 0.3  --     Cardiac Enzymes No results for input(s): TROPONINI in the last 168 hours.  Microbiology Results  Results for orders placed or performed in visit on 12/31/12  Urine culture     Status: None   Collection Time: 12/31/12  4:16 PM  Result Value Ref Range Status   Micro Text Report   Final       SOURCE: INDWELLING CATHETER    COMMENT                   NO GROWTH IN 36 HOURS   ANTIBIOTIC                                                      Culture, blood (single)     Status: None   Collection Time: 12/31/12  4:25 PM  Result Value Ref Range Status   Micro Text Report   Final       COMMENT                   NO GROWTH AEROBICALLY/ANAEROBICALLY IN 5 DAYS   ANTIBIOTIC                                                      Culture, blood (single)     Status: None   Collection Time: 12/31/12  4:32 PM  Result Value Ref Range Status   Micro Text Report   Final       COMMENT                   NO GROWTH AEROBICALLY/ANAEROBICALLY IN 5 DAYS   ANTIBIOTIC                                                      Body fluid culture     Status: None   Collection Time: 01/01/13 11:19 AM  Result Value Ref Range Status   Micro Text Report   Final       SOURCE: ASCITES FLUID    COMMENT  NO GROWTH AEROBICALLY/ANAEROBICALLY IN 4 DAYS   GRAM STAIN                MANY WHITE BLOOD CELLS   GRAM STAIN                NO ORGANISMS SEEN   GRAM STAIN                GROSSLY BLOODY   ANTIBIOTIC                                                        RADIOLOGY:  Dg Shoulder Right  02/11/2016  CLINICAL DATA:  Inpatient fell today while going to the bathroom, complains of right shoulder pain, knot on shoulder, laceration to face, unable to lift right arm EXAM: RIGHT SHOULDER - 2+ VIEW COMPARISON:  Right shoulder MRI, 08/27/2013 FINDINGS: No fracture. No bone lesion.  Glenohumeral joint is normally spaced and aligned. Mild AC joint osteoarthritis. Soft tissues are unremarkable. IMPRESSION: No fracture or dislocation. Electronically Signed   By: Lajean Manes M.D.   On: 02/11/2016 19:20   Ct Head Wo Contrast  02/11/2016  CLINICAL DATA:  Initial evaluation for acute syncope with forehead trauma. EXAM: CT HEAD WITHOUT CONTRAST TECHNIQUE: Contiguous axial images were obtained from the base of the skull through the vertex without intravenous contrast. COMPARISON:  Prior study from 07/19/2014. FINDINGS: Small contusion at the right forehead present. Scalp soft tissues otherwise within normal limits. No acute abnormality about the orbits. Mild mucosal thickening within the ethmoidal air cells and sphenoid sinuses, likely chronic. Paranasal sinuses are otherwise clear. No mastoid effusion. Calvarium intact. Probable remote posttraumatic deformity noted at the left orbital floor. Mild atrophy with chronic small vessel ischemic disease. 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 No acute large vessel territory infarct. No intracranial hemorrhage. No mass lesion, midline shift or mass effect. No hydrocephalus. No extra-axial fluid collection. IMPRESSION: 1. No acute intracranial process. 2. Small scalp contusion/laceration at the right forehead. 3. Cerebral atrophy with mild chronic small vessel ischemic disease. Electronically Signed   By: Jeannine Boga M.D.   On: 02/11/2016 07:03   Ct Abdomen Pelvis W Contrast  02/11/2016  CLINICAL DATA:  Fall, anemia, rectal bleeding EXAM: CT ABDOMEN AND PELVIS WITH CONTRAST TECHNIQUE: Multidetector CT imaging of the abdomen and pelvis was performed using the standard protocol following bolus administration of intravenous contrast. CONTRAST:  100 cc Isovue 370 COMPARISON:  12/31/2012 FINDINGS: Lower chest: Atherosclerosis of the lower thoracic aorta. Coronary atherosclerosis noted as well. Calcifications of the  mitral and aortic valves. Normal heart size. No pericardial or pleural effusion. Right lower chest demonstrates pleural thickening with small punctate pleural calcifications, suspect sequelae from prior inflammation/ infection or trauma. Degenerative changes noted of the thoracic spine with large osteophytes on the right Hepatobiliary: Micro nodular hepatic surface compatible with background cirrhosis. Small 6 mm hepatic dome hypodensity, image 13 suspect small cyst. Hypertrophy noted of the left lobe. Hepatic and portal veins remain patent. No biliary dilatation. Gallbladder is collapsed but does contain a small punctate calcified gallstone measuring 3 mm, image 21. Pancreas: No mass, inflammatory changes, or other significant abnormality. Spleen: Within normal limits in size and appearance. Adrenals/Urinary Tract: Normal adrenal glands. Small bilateral hypodense cortical renal cysts, 10 mm or less in size.  No renal obstruction or hydronephrosis. No obstructing ureteral calculus. Ureters are symmetric and decompressed bilaterally. Stomach/Bowel: Negative for bowel obstruction, significant dilatation, ileus, or free air. Cecum is high in the right upper quadrant. Normal appendix demonstrated containing contrast, retrocecal in position. left descending colon and sigmoid diverticulosis without acute inflammatory process. No fluid collection or abscess. Negative for ascites. Vascular/Lymphatic: Abdominal atherosclerosis noted. Negative for aneurysm. No occlusive process. No adenopathy. Reproductive: No mass or other significant abnormality. Other: No inguinal adenopathy. Small right inguinal fat containing hernia. Intact abdominal wall. No ventral hernia. Musculoskeletal:  Diffuse degenerative changes of the spine. IMPRESSION: Hepatic cirrhosis and scattered small hepatic hypodensities, suspect small hepatic cysts. Incidental cholelithiasis without biliary dilatation or cholecystitis. Aortic atherosclerosis without  aneurysm Diverticulosis without acute inflammatory process small fat containing right inguinal hernia Electronically Signed   By: Jerilynn Mages.  Shick M.D.   On: 02/11/2016 09:22    EKG:   Orders placed or performed during the hospital encounter of 02/11/16  . EKG 12-Lead  . EKG 12-Lead      Management plans discussed with the patient, family and they are in agreement.  CODE STATUS:     Code Status Orders        Start     Ordered   02/11/16 0737  Full code   Continuous     02/11/16 0736    Code Status History    Date Active Date Inactive Code Status Order ID Comments User Context   This patient has a current code status but no historical code status.      TOTAL TIME TAKING CARE OF THIS PATIENT: 45 minutes.    @[facility] @  on 02/14/2016 at 9:50 AM  Between 7am to 6pm - Pager - [phone]  After 6pm go to [email or web address] - password [email or web address] [facility]  Office  [phone]  CC: Primary care physician; Ollen Bowl, MD

## 2016-02-14 NOTE — Progress Notes (Signed)
Dr Tiffany Kocher notified of bright red bloody stool. Patient was supposed to be discharged. Dr Margaretmary Eddy put in order for Hgb at 11am, and Dr Tiffany Kocher or his PA will be by to see patient. Patient is stable at the moment. Continue to monitor.

## 2016-02-14 NOTE — Care Management Important Message (Signed)
Important Message  Patient Details  Name: Craig Maldonado MRN: [id number] Date of Birth: [birth year]   Medicare Important Message Given:  Yes    Marshell Garfinkel, RN 02/14/2016, 2:00 PM

## 2016-02-14 NOTE — Progress Notes (Signed)
*  PRELIMINARY RESULTS* Echocardiogram 2D Echocardiogram has been performed.  Craig Maldonado 02/14/2016, 8:03 PM

## 2016-02-14 NOTE — Progress Notes (Signed)
  [facility]  [address], [place], [place] [address] Phone: [phone] Fax : [phone]   Subjective: The patient was scheduled to go home today but then had an episode of rectal bleeding. The patient was found to have diverticulosis on his colonoscopy over the weekend. The patient reports that the stools were maroon. The patient also had an EGD on Friday that did not show any active bleeding but showed some shallow ulcers without any signs of recent or active bleeding. The patient denies any abdominal pain. He continues to have some pain in his right shoulder. The patient is tolerating a diet.   Objective: Vital signs in last 24 hours: Filed Vitals:   02/13/16 1932 02/14/16 0401 02/14/16 0749 02/14/16 1529  BP: 147/78 144/78 159/73 117/70  Pulse: 82 77 68 86  Temp: 98.4 F (36.9 C) 98.4 F (36.9 C) 98.5 F (36.9 C) 98.2 F (36.8 C)  TempSrc: Oral Oral Oral Oral  Resp: 18 16 18 18   Height:      Weight:      SpO2: 99% 96% 99% 98%   Weight change:   Intake/Output Summary (Last 24 hours) at 02/14/16 1803 Last data filed at 02/14/16 1142  Gross per 24 hour  Intake 1281.25 ml  Output   1775 ml  Net -493.75 ml     Exam: Heart:: Regular rate and rhythm Lungs: normal Abdomen: soft, nontender, normal bowel sounds   Lab Results: @LABTEST2 @ Micro Results: No results found for this or any previous visit (from the past 240 hour(s)). Studies/Results: No results found. Medications: I have reviewed the patient's current medications. Scheduled Meds: . folic acid  1 mg Oral Daily  . multivitamin with minerals  1 tablet Oral Daily  . nicotine  14 mg Transdermal Daily  . pantoprazole (PROTONIX) IV  40 mg Intravenous Q12H  . sodium chloride flush  3 mL Intravenous Q12H  . thiamine  100 mg Oral Daily   Or  . thiamine  100 mg Intravenous Daily   Continuous Infusions:  PRN Meds:.acetaminophen **OR** acetaminophen, ondansetron (ZOFRAN) IV,  oxyCODONE   Assessment: Active Problems:   GI bleed   Blood in stool   Esophageal varices without bleeding (HCC)   Esophagogastric ulcer   Duodenitis    Plan: Patient came in with a GI bleed. The patient was found to have some shallow ulcerations in the stomach and the colonoscopy showed diverticulosis. The patient had bleeding today and was not discharged. If the patient continues to bleed consider a red tag cell bleeding scan and possible vascular intervention. Very little to do from an endoscopic point of view to stop diverticular bleeding.   LOS: 3 days   Craig Maldonado 02/14/2016, 6:03 PM

## 2016-02-14 NOTE — Op Note (Addendum)
[facility] Gastroenterology Patient Name: Craig Maldonado Procedure Date: 02/12/2016 11:18 AM MRN: [id number] Account #: [id number] Date of Birth: 1951-03-03 Admit Type: Inpatient Age: 65 Room: [facility] ENDO ROOM 4 Note Status: Supervisor Override Procedure:            Colonoscopy Indications:          Hematochezia Providers:            Percell Miller L. Drema Dallas Referring MD:         Arlis Porta, MD (Referring MD) Medicines:            Monitored Anesthesia Care Complications:        No immediate complications. Estimated blood loss:                        Minimal. Procedure:            Pre-Anesthesia Assessment:                       - Prior to the procedure, a History and Physical was                        performed, and patient medications, allergies and                        sensitivities were reviewed. The patient's tolerance of                        previous anesthesia was reviewed.                       - The risks and benefits of the procedure and the                        sedation options and risks were discussed with the                        patient. All questions were answered and informed                        consent was obtained.                       - Patient identification and proposed procedure were                        verified prior to the procedure by the physician, the                        nurse and the anesthetist. The procedure was verified                        in the procedure room.                       After obtaining informed consent, the colonoscope was                        passed under direct vision. Throughout the procedure,                        the patient's  blood pressure, pulse, and oxygen                        saturations were monitored continuously. The Olympus                        CF-Q160AL colonoscope (S#. [id number]) was introduced                        through the anus and advanced to the the cecum,          identified by appendiceal orifice and ileocecal valve.                        The colonoscopy was performed without difficulty. The                        patient tolerated the procedure well. The quality of                        the bowel preparation was inadequate. Findings:      The perianal and digital rectal examinations were normal.      Multiple small and large-mouthed diverticula were found in the sigmoid       colon and descending colon. There was evidence of recent bleeding from       the diverticular opening.      A 4 mm polyp was found in the transverse colon. The polyp was sessile.       The polyp was removed with a cold snare. Resection was complete, but the       polyp tissue was not retrieved. To prevent bleeding after the       polypectomy, one hemostatic clip was successfully placed.      A 7 mm polyp was found in the sigmoid colon. The polyp was sessile. This       was not removed given active bleeding with prior polypectomy. Impression:           - Preparation of the colon was inadequate. Large                        amounts of blood, clotted blood, and stool noted in the                        colon impairing visualization.                       - Diverticulosis in the sigmoid colon and in the                        descending colon. There was evidence of recent bleeding                        from the diverticular openings. Suspect diverticular                        source of bleeding.                       - One 4 mm polyp in the transverse colon, removed with  a cold snare. Complete resection. Polyp tissue not                        retrieved. Clip was placed.                       - One 7 mm polyp in the sigmoid colon. Not retrieved                        given active bleeding with prior polypectomy and                        current evaluation for active bleeding. Recommendation:       - Return patient to hospital ward for ongoing  care.                       - Clear liquid diet.                       - Continue present medications.                       - Please contact if further bleeding occurs, as we will                        have to discuss options for intervention given poor                        visualization with colonoscopy today.                       - Repeat colonoscopy in 3-6 months for removal of                        visualized polyps given inadequate prep and bleeding                        with small polypectomy today (in the setting of                        bleeding evaluation). Procedure Code(s):    --- Professional ---                       [phone], Colonoscopy, flexible; with removal of tumor(s),                        polyp(s), or other lesion(s) by snare technique Diagnosis Code(s):    --- Professional ---                       K57.31, Diverticulosis of large intestine without                        perforation or abscess with bleeding                       D12.3, Benign neoplasm of transverse colon (hepatic                        flexure or splenic flexure)  K92.1, Melena (includes Hematochezia) CPT copyright 2016 American Medical Association. All rights reserved. The codes documented in this report are preliminary and upon coder review may  be revised to meet current compliance requirements. Dr. Erven Colla, MD Fredonia Highland, MD 02/13/2016 10:16:24 AM This report has been signed electronically. Number of Addenda: 0 Note Initiated On: 02/12/2016 11:18 AM Scope Withdrawal Time: 0 hours 37 minutes 45 seconds  Total Procedure Duration: 0 hours 59 minutes 21 seconds  Estimated Blood Loss: Estimated blood loss was minimal.      [facility]

## 2016-02-14 NOTE — Progress Notes (Signed)
Dr Humphrey Rolls said to discontinue telemetry

## 2016-02-14 NOTE — Progress Notes (Signed)
SUBJECTIVE: Patient is feeling much better   Filed Vitals:   02/13/16 1514 02/13/16 1932 02/14/16 0401 02/14/16 0749  BP: 146/73 147/78 144/78 159/73  Pulse: 71 82 77 68  Temp: 98.6 F (37 C) 98.4 F (36.9 C) 98.4 F (36.9 C) 98.5 F (36.9 C)  TempSrc: Oral Oral Oral Oral  Resp: 16 18 16 18   Height:      Weight:      SpO2: 97% 99% 96% 99%    Intake/Output Summary (Last 24 hours) at 02/14/16 0826 Last data filed at 02/14/16 0720  Gross per 24 hour  Intake 1521.25 ml  Output   2875 ml  Net -1353.75 ml    LABS: Basic Metabolic Panel:  Recent Labs  02/12/16 0247  NA 140  K 3.5  CL 113*  CO2 22  GLUCOSE 98  BUN 7  CREATININE 0.68  CALCIUM 7.8*   Liver Function Tests: No results for input(s): AST, ALT, ALKPHOS, BILITOT, PROT, ALBUMIN in the last 72 hours. No results for input(s): LIPASE, AMYLASE in the last 72 hours. CBC:  Recent Labs  02/12/16 0247  02/12/16 2312 02/14/16 0409  WBC 9.0  --   --  7.9  HGB 9.0*  < > 8.7* 9.4*  HCT 26.8*  --   --  27.3*  MCV 92.5  --   --  94.6  PLT 130*  --   --  113*  < > = values in this interval not displayed. Cardiac Enzymes: No results for input(s): CKTOTAL, CKMB, CKMBINDEX, TROPONINI in the last 72 hours. BNP: Invalid input(s): POCBNP D-Dimer: No results for input(s): DDIMER in the last 72 hours. Hemoglobin A1C:  Recent Labs  02/12/16 0247  HGBA1C 5.2   Fasting Lipid Panel: No results for input(s): CHOL, HDL, LDLCALC, TRIG, CHOLHDL, LDLDIRECT in the last 72 hours. Thyroid Function Tests: No results for input(s): TSH, T4TOTAL, T3FREE, THYROIDAB in the last 72 hours.  Invalid input(s): FREET3 Anemia Panel: No results for input(s): VITAMINB12, FOLATE, FERRITIN, TIBC, IRON, RETICCTPCT in the last 72 hours.   PHYSICAL EXAM General: Well developed, well nourished, in no acute distress HEENT:  Normocephalic and atramatic Neck:  No JVD.  Lungs: Clear bilaterally to auscultation and percussion. Heart: HRRR .  Normal S1 and S2 without gallops or murmurs.  Abdomen: Bowel sounds are positive, abdomen soft and non-tender  Msk:  Back normal, normal gait. Normal strength and tone for age. Extremities: No clubbing, cyanosis or edema.   Neuro: Alert and oriented X 3. Psych:  Good affect, responds appropriately  TELEMETRY:Sinus rhythm  ASSESSMENT AND PLAN: Syncope related to ulcer in the stomach and GI bleed. There is no evidence of second-degree AV block on monitor as well as on the strips. We will need cardiac workup upon discharge and will set it up [facility] as an outpatient.  Active Problems:   GI bleed   Blood in stool   Esophageal varices without bleeding ([place])   Esophagogastric ulcer   Duodenitis    Craig Bong A, MD, [facility] 02/14/2016 8:26 AM

## 2016-02-15 ENCOUNTER — Inpatient Hospital Stay: Payer: Medicare Other

## 2016-02-15 LAB — PREPARE RBC (CROSSMATCH)

## 2016-02-15 LAB — CBC
HEMATOCRIT: 21.2 % — AB (ref 40.0–52.0)
Hemoglobin: 7.1 g/dL — ABNORMAL LOW (ref 13.0–18.0)
MCH: 31.9 pg (ref 26.0–34.0)
MCHC: 33.6 g/dL (ref 32.0–36.0)
MCV: 94.8 fL (ref 80.0–100.0)
Platelets: 167 10*3/uL (ref 150–440)
RBC: 2.23 MIL/uL — AB (ref 4.40–5.90)
RDW: 15.3 % — AB (ref 11.5–14.5)
WBC: 12.9 10*3/uL — AB (ref 3.8–10.6)

## 2016-02-15 LAB — GLUCOSE, CAPILLARY
GLUCOSE-CAPILLARY: 169 mg/dL — AB (ref 65–99)
GLUCOSE-CAPILLARY: 164 mg/dL — AB (ref 65–99)
Glucose-Capillary: 137 mg/dL — ABNORMAL HIGH (ref 65–99)
Glucose-Capillary: 115 mg/dL — ABNORMAL HIGH (ref 65–99)

## 2016-02-15 LAB — ECHOCARDIOGRAM COMPLETE
Height: 71 in
WEIGHTICAEL: 3558.4 [oz_av]

## 2016-02-15 LAB — HEMOGLOBIN AND HEMATOCRIT, BLOOD
HEMATOCRIT: 22.8 % — AB (ref 40.0–52.0)
Hemoglobin: 8 g/dL — ABNORMAL LOW (ref 13.0–18.0)

## 2016-02-15 MED ORDER — TECHNETIUM TC 99M-LABELED RED BLOOD CELLS IV KIT
20.0000 | PACK | Freq: Once | INTRAVENOUS | Status: AC | PRN
  Administered 2016-02-15: 18.697 via INTRAVENOUS

## 2016-02-15 MED ORDER — SODIUM CHLORIDE 0.9 % IV SOLN
Freq: Once | INTRAVENOUS | Status: AC
  Administered 2016-02-15: 13:00:00 via INTRAVENOUS

## 2016-02-15 MED ORDER — SODIUM CHLORIDE 0.9 % IV SOLN: Freq: Once | INTRAVENOUS | Status: DC

## 2016-02-15 NOTE — Progress Notes (Signed)
Patient not on floor and down for a cardiology test. The patient's hemoglobin has dropped again. If the patient continues to bleed then vascular surgery should be contacted for possible embolization of a diverticular bleed. Repeat EGD or colonoscopy is not needed at this time.

## 2016-02-15 NOTE — Progress Notes (Addendum)
[place] at [place] NAME: Craig Maldonado    MR#:  [id number]  DATE OF BIRTH:  12/26/1950  SUBJECTIVE:  CHIEF COMPLAINT:  Patient Had 2 episodes of large bowel movements with blood and hemoglobin is at 7.1 today. Denies any abdominal pain. 3/24  and colonoscopy 3/25 and was diagnosed with diverticulosis . REVIEW OF SYSTEMS:  CONSTITUTIONAL: No fever, fatigue or weakness.  EYES: No blurred or double vision.  EARS, NOSE, AND THROAT: No tinnitus or ear pain.  RESPIRATORY: No cough, shortness of breath, wheezing or hemoptysis.  CARDIOVASCULAR: No chest pain, orthopnea, edema.  GASTROINTESTINAL: No nausea, vomiting, diarrhea or abdominal pain. Reporting bowel movements with blood 2 GENITOURINARY: No dysuria, hematuria.  ENDOCRINE: No polyuria, nocturia,  HEMATOLOGY: No anemia, easy bruising  SKIN: No rash or lesion. MUSCULOSKELETAL: No joint pain or arthritis.   NEUROLOGIC: No tingling, numbness, weakness.  PSYCHIATRY: No anxiety or depression.   DRUG ALLERGIES:   Allergies  Allergen Reactions  . Codeine Nausea And Vomiting    VITALS:  Blood pressure 103/57, pulse 83, temperature 98.7 F (37.1 C), temperature source Oral, resp. rate 18, height 5\' 11"  (1.803 m), weight 100.88 kg (222 lb 6.4 oz), SpO2 99 %.  PHYSICAL EXAMINATION:  GENERAL:  65 y.o.-year-old patient lying in the bed with no acute distress.  EYES: Pupils equal, round, reactive to light and accommodation. No scleral icterus. Extraocular muscles intact.  HEENT: Head atraumatic, normocephalic. Oropharynx and nasopharynx clear.  NECK:  Supple, no jugular venous distention. No thyroid enlargement, no tenderness.  LUNGS: Normal breath sounds bilaterally, no wheezing, rales,rhonchi or crepitation. No use of accessory muscles of respiration.  CARDIOVASCULAR: S1, S2 normal. No murmurs, rubs, or gallops.  ABDOMEN: Soft, nontender, nondistended. Bowel sounds present. No  organomegaly or mass.  EXTREMITIES: No pedal edema, cyanosis, or clubbing.  NEUROLOGIC: Cranial nerves II through XII are intact. Muscle strength 5/5 in all extremities. Sensation intact. Gait not checked.  PSYCHIATRIC: The patient is alert and oriented x 3.  SKIN: No obvious rash, lesion, or ulcer.    LABORATORY PANEL:   CBC  Recent Labs Lab 02/15/16 0533  WBC 12.9*  HGB 7.1*  HCT 21.2*  PLT 167   ------------------------------------------------------------------------------------------------------------------  Chemistries   Recent Labs Lab 02/11/16 0606 02/12/16 0247  NA 137 140  K 3.6 3.5  CL 109 113*  CO2 23 22  GLUCOSE 232* 98  BUN 9 7  CREATININE 0.82 0.68  CALCIUM 8.0* 7.8*  AST 36  --   ALT 23  --   ALKPHOS 103  --   BILITOT 0.3  --    ------------------------------------------------------------------------------------------------------------------  Cardiac Enzymes No results for input(s): TROPONINI in the last 168 hours. ------------------------------------------------------------------------------------------------------------------  RADIOLOGY:  Nm Gi Blood Loss  02/15/2016  CLINICAL DATA:  GI bleed. EXAM: NUCLEAR MEDICINE GASTROINTESTINAL BLEEDING SCAN TECHNIQUE: Sequential abdominal images were obtained following intravenous administration of Tc-56m labeled red blood cells. RADIOPHARMACEUTICALS:  18.7 mCi Tc-47m in-vitro labeled red cells. COMPARISON:  CT 02/11/2016. FINDINGS: No evidence of GI bleed.  Exam appears normal. IMPRESSION: Normal exam. Electronically Signed   By: Marcello Moores  Register   On: 02/15/2016 12:41    EKG:   Orders placed or performed during the hospital encounter of 02/11/16  . EKG 12-Lead  . EKG 12-Lead    ASSESSMENT AND PLAN:   1. Acute GI bleed. 2/2 lower GI bleed with bright red blood per rectum secondary to diverticulosis Pt had another episode of bloody  BM Type and cross match and transfuse 2 units of blood and monitor  hemoglobin closely Bleeding scan is negative If patient persistently bleeds will consider vascular surgery consult Colonoscopy has revealed diverticulosis and polyps. GI is recommending outpatient colonoscopy in 3-5 months for complete polypectomy Dietary consult is placed for diverticular diet education EGD with gastric ulcer and esophageal varices but no active bleed.  Patient does have a history of alcohol.  Anticipate discharge in a.m. if stable, and okay from GI standpoint  2. 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  3. Tobacco abuse smoking cessation counseling done 3 minutes by me. Nicotine patch ordered  4. Alcohol abuse. on Ciwa protocol. No withdrawal noticed  5. Right shoulder pain, neg x-ray. Pt refused PT 6. Impaired fasting glucose, A1c 5.2. Ruled out for diabetes mellitus.       All the records are reviewed and case discussed with Care Management/Social Workerr. Management plans discussed with the patient, son and brother at bedside. They all verbalized understanding of the plan   CODE STATUS: fc  TOTAL TIME TAKING CARE OF THIS PATIENT: min35 utes.   POSSIBLE D/C IN 1-2 DAYS, DEPENDING ON CLINICAL CONDITION.   Nicholes Mango M.D on 02/15/2016 at 3:48 PM  Between 7am to 6pm - Pager - [phone] After 6pm go to [email or web address] - password [email or web address] [place] [facility]  Office  [phone]  CC: Primary care physician; Ollen Bowl, MD

## 2016-02-15 NOTE — Progress Notes (Signed)
Pt HGB 7.1 this AM c/o fatigue, Dr Margaretmary Eddy notified.

## 2016-02-15 NOTE — Progress Notes (Addendum)
  SUBJECTIVE: Patient denies any chest pain or shortness of breath.   Filed Vitals:   02/14/16 1529 02/14/16 2034 02/15/16 0347 02/15/16 0743  BP: 117/70 138/63 117/62 115/65  Pulse: 86 91 99 91  Temp: 98.2 F (36.8 C) 98.3 F (36.8 C) 98.6 F (37 C) 98.2 F (36.8 C)  TempSrc: Oral Oral Oral Oral  Resp: 18 18 18 18   Height:      Weight:      SpO2: 98% 97% 98% 98%    Intake/Output Summary (Last 24 hours) at 02/15/16 0855 Last data filed at 02/14/16 1800  Gross per 24 hour  Intake    240 ml  Output      0 ml  Net    240 ml    LABS: Basic Metabolic Panel: No results for input(s): NA, K, CL, CO2, GLUCOSE, BUN, CREATININE, CALCIUM, MG, PHOS in the last 72 hours. Liver Function Tests: No results for input(s): AST, ALT, ALKPHOS, BILITOT, PROT, ALBUMIN in the last 72 hours. No results for input(s): LIPASE, AMYLASE in the last 72 hours. CBC:  Recent Labs  02/14/16 0409 02/14/16 1113 02/15/16 0533  WBC 7.9  --  12.9*  HGB 9.4* 8.8* 7.1*  HCT 27.3* 25.8* 21.2*  MCV 94.6  --  94.8  PLT 113*  --  167   Cardiac Enzymes: No results for input(s): CKTOTAL, CKMB, CKMBINDEX, TROPONINI in the last 72 hours. BNP: Invalid input(s): POCBNP D-Dimer: No results for input(s): DDIMER in the last 72 hours. Hemoglobin A1C: No results for input(s): HGBA1C in the last 72 hours. Fasting Lipid Panel: No results for input(s): CHOL, HDL, LDLCALC, TRIG, CHOLHDL, LDLDIRECT in the last 72 hours. Thyroid Function Tests: No results for input(s): TSH, T4TOTAL, T3FREE, THYROIDAB in the last 72 hours.  Invalid input(s): FREET3 Anemia Panel: No results for input(s): VITAMINB12, FOLATE, FERRITIN, TIBC, IRON, RETICCTPCT in the last 72 hours.   PHYSICAL EXAM General: Well developed, well nourished, in no acute distress HEENT:  Normocephalic and atramatic Neck:  No JVD.  Lungs: Clear bilaterally to auscultation and percussion. Heart: HRRR . 3/6 systolic murmur Abdomen: Bowel sounds are  positive, abdomen soft and non-tender  Msk:  Back normal, normal gait. Normal strength and tone for age. Extremities: No clubbing, cyanosis or edema.   Neuro: Alert and oriented X 3. Psych:  Good affect, responds appropriately  TELEMETRY:Monitor was discontinued  ASSESSMENT AND PLAN: Syncope due to GI bleed and not due to second-degree AV block. The patient was seen on monitor review which revealed no evidence of second-degree AV block. The patient remains in sinus rhythm with a reasonable heart rate. The patient is not a candidate for pacemaker. The patient will have an outpatient Lexiscan Myoview to rule out CAD upon discharge.  Active Problems:   GI bleed   Blood in stool   Esophageal varices without bleeding ([place])   Esophagogastric ulcer   Duodenitis   Gastrointestinal hemorrhage associated with anorectal source    Daune Perch, NP 02/15/2016 8:55 AM

## 2016-02-15 NOTE — Progress Notes (Signed)
Patient stated he was dizzy after episode of large amount bright red stool. Nursing continues to monitor.

## 2016-02-16 ENCOUNTER — Inpatient Hospital Stay: Payer: Medicare Other

## 2016-02-16 LAB — PREPARE RBC (CROSSMATCH)

## 2016-02-16 LAB — GLUCOSE, CAPILLARY
GLUCOSE-CAPILLARY: 114 mg/dL — AB (ref 65–99)
GLUCOSE-CAPILLARY: 141 mg/dL — AB (ref 65–99)
Glucose-Capillary: 134 mg/dL — ABNORMAL HIGH (ref 65–99)
Glucose-Capillary: 151 mg/dL — ABNORMAL HIGH (ref 65–99)

## 2016-02-16 LAB — CBC
HCT: 21.8 % — ABNORMAL LOW (ref 40.0–52.0)
HEMOGLOBIN: 7.4 g/dL — AB (ref 13.0–18.0)
MCH: 30.7 pg (ref 26.0–34.0)
MCHC: 33.9 g/dL (ref 32.0–36.0)
MCV: 90.6 fL (ref 80.0–100.0)
PLATELETS: 134 10*3/uL — AB (ref 150–440)
RBC: 2.41 MIL/uL — AB (ref 4.40–5.90)
RDW: 15.3 % — ABNORMAL HIGH (ref 11.5–14.5)
WBC: 11.4 10*3/uL — ABNORMAL HIGH (ref 3.8–10.6)

## 2016-02-16 LAB — HEMOGLOBIN AND HEMATOCRIT, BLOOD
HCT: 20.7 % — ABNORMAL LOW (ref 40.0–52.0)
HEMATOCRIT: 18.9 % — AB (ref 40.0–52.0)
Hemoglobin: 6.9 g/dL — ABNORMAL LOW (ref 13.0–18.0)
Hemoglobin: 6.4 g/dL — ABNORMAL LOW (ref 13.0–18.0)

## 2016-02-16 LAB — SURGICAL PATHOLOGY

## 2016-02-16 MED ORDER — SODIUM CHLORIDE 0.9 % IV SOLN
Freq: Once | INTRAVENOUS | Status: AC
  Administered 2016-02-16: 22:00:00 via INTRAVENOUS

## 2016-02-16 NOTE — Progress Notes (Addendum)
[place] at [place] NAME: Craig Maldonado    MR#:  [id number]  DATE OF BIRTH:  1951/09/11  SUBJECTIVE:  CHIEF COMPLAINT:  Patient Had another 2 episodes of bowel movements with blood in the past 12 hours and hemoglobin is at 7.4 today. Patient has received 2 units of blood transfusion yesterday Denies any abdominal pain. Patient is concerned about his drop in hemoglobin 3/24  and colonoscopy 3/25 and was diagnosed with diverticulosis . REVIEW OF SYSTEMS:  CONSTITUTIONAL: No fever, fatigue or weakness.  EYES: No blurred or double vision.  EARS, NOSE, AND THROAT: No tinnitus or ear pain.  RESPIRATORY: No cough, shortness of breath, wheezing or hemoptysis.  CARDIOVASCULAR: No chest pain, orthopnea, edema.  GASTROINTESTINAL: No nausea, vomiting, diarrhea or abdominal pain. Reporting bowel movements with blood 2 GENITOURINARY: No dysuria, hematuria.  ENDOCRINE: No polyuria, nocturia,  HEMATOLOGY: No anemia, easy bruising  SKIN: No rash or lesion. MUSCULOSKELETAL: No joint pain or arthritis.   NEUROLOGIC: No tingling, numbness, weakness.  PSYCHIATRY: No anxiety or depression.   DRUG ALLERGIES:   Allergies  Allergen Reactions  . Codeine Nausea And Vomiting    VITALS:  Blood pressure 122/59, pulse 85, temperature 98.3 F (36.8 C), temperature source Oral, resp. rate 16, height 5\' 11"  (1.803 m), weight 100.88 kg (222 lb 6.4 oz), SpO2 97 %.  PHYSICAL EXAMINATION:  GENERAL:  65 y.o.-year-old patient lying in the bed with no acute distress.  EYES: Pupils equal, round, reactive to light and accommodation. No scleral icterus. Extraocular muscles intact.  HEENT: Head atraumatic, normocephalic. Oropharynx and nasopharynx clear.  NECK:  Supple, no jugular venous distention. No thyroid enlargement, no tenderness.  LUNGS: Normal breath sounds bilaterally, no wheezing, rales,rhonchi or crepitation. No use of accessory muscles of respiration.   CARDIOVASCULAR: S1, S2 normal. No murmurs, rubs, or gallops.  ABDOMEN: Soft, nontender, nondistended. Bowel sounds present. No organomegaly or mass.  EXTREMITIES: No pedal edema, cyanosis, or clubbing.  NEUROLOGIC: Cranial nerves II through XII are intact. Muscle strength 5/5 in all extremities. Sensation intact. Gait not checked.  PSYCHIATRIC: The patient is alert and oriented x 3.  SKIN: No obvious rash, lesion, or ulcer.    LABORATORY PANEL:   CBC  Recent Labs Lab 02/16/16 0523  WBC 11.4*  HGB 7.4*  HCT 21.8*  PLT 134*   ------------------------------------------------------------------------------------------------------------------  Chemistries   Recent Labs Lab 02/11/16 0606 02/12/16 0247  NA 137 140  K 3.6 3.5  CL 109 113*  CO2 23 22  GLUCOSE 232* 98  BUN 9 7  CREATININE 0.82 0.68  CALCIUM 8.0* 7.8*  AST 36  --   ALT 23  --   ALKPHOS 103  --   BILITOT 0.3  --    ------------------------------------------------------------------------------------------------------------------  Cardiac Enzymes No results for input(s): TROPONINI in the last 168 hours. ------------------------------------------------------------------------------------------------------------------  RADIOLOGY:  Nm Gi Blood Loss  02/15/2016  CLINICAL DATA:  GI bleed. EXAM: NUCLEAR MEDICINE GASTROINTESTINAL BLEEDING SCAN TECHNIQUE: Sequential abdominal images were obtained following intravenous administration of Tc-43m labeled red blood cells. RADIOPHARMACEUTICALS:  18.7 mCi Tc-70m in-vitro labeled red cells. COMPARISON:  CT 02/11/2016. FINDINGS: No evidence of GI bleed.  Exam appears normal. IMPRESSION: Normal exam. Electronically Signed   By: Marcello Moores  Register   On: 02/15/2016 12:41    EKG:   Orders placed or performed during the hospital encounter of 02/11/16  . EKG 12-Lead  . EKG 12-Lead    ASSESSMENT AND PLAN:   1.  Acute GI bleed. 2/2 lower GI bleed with bright red blood per rectum  secondary to diverticulosis Pt had another 2 episodes of bloody BM in the past 12 hours Type and cross match and transfuse 2 units of blood and monitor hemoglobin closely Bleeding scan is negative-initially 3/28 Patient's repeat hemoglobin is at 6.9, discussed with Dr. Lucky Cowboy vascular surgery, repeating bleeding scan again further management based on the results Colonoscopy has revealed diverticulosis and polyps. GI is recommending outpatient colonoscopy in 3-5 months for complete polypectomy Dietary consult is placed for diverticular diet education EGD with gastric ulcer and esophageal varices but no active bleed.  Patient does have a history of alcohol.    2. 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  3. Tobacco abuse smoking cessation counseling done 3 minutes by me. Nicotine patch ordered  4. Alcohol abuse. on Ciwa protocol. No withdrawal noticed  5. Right shoulder pain, neg x-ray. Pt refused PT 6. Impaired fasting glucose, A1c 5.2. Ruled out for diabetes mellitus.       All the records are reviewed and case discussed with Care Management/Social Workerr. Management plans discussed with the patient, son and brother at bedside. They all verbalized understanding of the plan   CODE STATUS: fc  TOTAL TIME TAKING CARE OF THIS PATIENT: 91minutes  POSSIBLE D/C IN ?DAYS, DEPENDING ON CLINICAL CONDITION.   Nicholes Mango M.D on 02/16/2016 at 1:11 PM  Between 7am to 6pm - Pager - [phone] After 6pm go to [email or web address] - password [email or web address] [place] [facility]  Office  [phone]  CC: Primary care physician; Ollen Bowl, MD

## 2016-02-16 NOTE — Progress Notes (Signed)
Pt with noted blood in stool since lunchtime. Transferred to [facility] for bleeding scan, repeat from yesterday, HGB currently at 6.9. Oxycodone given PRN q 4hrs for R shoulder pain.

## 2016-02-16 NOTE — Consult Note (Signed)
[facility] Consult Note  MRN : [id number]  Craig Maldonado is a 65 y.o. (Oct 04, 1951) male who presents with chief complaint of  Chief Complaint  Patient presents with  . GI Bleeding  .  History of Present Illness: Patient admitted with several-day history of lower GI bleeding. He has now been in the hospital for several days and has had a reasonably thorough workup including an upper endoscopy and a colonoscopy. The upper endoscopy had no signs of bleeding. The colonoscopy showed difficult visualization due to extensive blood in the lumen with diverticuli identified throughout the sigmoid colon but the remainder of the colon was not well identified. He then had a bleeding scan performed yesterday which at that time was negative. Since that time, he has dropped his hemoglobin again. He continues to have painless lower GI bleeding. He has not had pain in his abdomen any point. He has no nausea or vomiting. He has no fevers or chills. This is his first episode of significant bleeding with no previous episodes to his knowledge.  Current Facility-Administered Medications  Medication Dose Route Frequency Provider Last Rate Last Dose  . 0.9 %  sodium chloride infusion   Intravenous Once Nicholes Mango, MD      . acetaminophen (TYLENOL) tablet 650 mg  650 mg Oral Q6H PRN Loletha Grayer, MD       Or  . acetaminophen (TYLENOL) suppository 650 mg  650 mg Rectal Q6H PRN Loletha Grayer, MD      . folic acid (FOLVITE) tablet 1 mg  1 mg Oral Daily Loletha Grayer, MD   1 mg at 02/16/16 1023  . multivitamin with minerals tablet 1 tablet  1 tablet Oral Daily Loletha Grayer, MD   1 tablet at 02/16/16 1013  . nicotine (NICODERM CQ - dosed in mg/24 hours) patch 14 mg  14 mg Transdermal Daily Loletha Grayer, MD   14 mg at 02/14/16 0730  . ondansetron (ZOFRAN) injection 4 mg  4 mg Intravenous Q6H PRN Nicholes Mango, MD   4 mg at 02/14/16 1124  . oxyCODONE (Oxy IR/ROXICODONE) immediate  release tablet 5 mg  5 mg Oral Q4H PRN Loletha Grayer, MD   5 mg at 02/16/16 1351  . pantoprazole (PROTONIX) injection 40 mg  40 mg Intravenous Q12H Loletha Grayer, MD   40 mg at 02/16/16 1014  . sodium chloride flush (NS) 0.9 % injection 3 mL  3 mL Intravenous Q12H Loletha Grayer, MD   3 mL at 02/16/16 1014  . thiamine (VITAMIN B-1) tablet 100 mg  100 mg Oral Daily Loletha Grayer, MD   100 mg at 02/16/16 1013    History reviewed. No pertinent past medical history.  Past Surgical History  Procedure Laterality Date  . Right leg surgery    . Esophagogastroduodenoscopy (egd) with propofol N/A 02/11/2016    Procedure: ESOPHAGOGASTRODUODENOSCOPY (EGD) WITH PROPOFOL;  Surgeon: Lucilla Lame, MD;  Location: [facility] ENDOSCOPY;  Service: Endoscopy;  Laterality: N/A;    Social History Social History  Substance Use Topics  . Smoking status: Current Every Day Smoker -- 0.50 packs/day  . Smokeless tobacco: None  . Alcohol Use: Yes  No IV drug use  Family History Family History  Problem Relation Age of Onset  . Healthy Mother   . Tuberculosis Father   No bleeding disorders, clotting disorders, or autoimmune diseases  Allergies  Allergen Reactions  . Codeine Nausea And Vomiting     REVIEW OF SYSTEMS (Negative unless checked)  Constitutional: []   Weight loss  [] Fever  [] Chills Cardiac: [] Chest pain   [] Chest pressure   [] Palpitations   [] Shortness of breath when laying flat   [] Shortness of breath at rest   [] Shortness of breath with exertion. Vascular:  [] Pain in legs with walking   [] Pain in legs at rest   [] Pain in legs when laying flat   [] Claudication   [] Pain in feet when walking  [] Pain in feet at rest  [] Pain in feet when laying flat   [] History of DVT   [] Phlebitis   [] Swelling in legs   [] Varicose veins   [] Non-healing ulcers Pulmonary:   [] Uses home oxygen   [] Productive cough   [] Hemoptysis   [] Wheeze  [] COPD   [] Asthma Neurologic:  [] Dizziness  [] Blackouts   [] Seizures   [] History  of stroke   [] History of TIA  [] Aphasia   [] Temporary blindness   [] Dysphagia   [] Weakness or numbness in arms   [] Weakness or numbness in legs Musculoskeletal:  [] Arthritis   [] Joint swelling   [] Joint pain   [] Low back pain Hematologic:  [] Easy bruising  [] Easy bleeding   [] Hypercoagulable state   [] Anemic  [] Hepatitis Gastrointestinal:  [x] Blood in stool   [] Vomiting blood  [] Gastroesophageal reflux/heartburn   [] Difficulty swallowing. Genitourinary:  [] Chronic kidney disease   [] Difficult urination  [] Frequent urination  [] Burning with urination   [] Blood in urine Skin:  [] Rashes   [] Ulcers   [] Wounds Psychological:  [] History of anxiety   []  History of major depression.  Physical Examination  Filed Vitals:   02/15/16 1935 02/16/16 0406 02/16/16 0757 02/16/16 1449  BP: 103/62 114/58 122/59 107/57  Pulse: 74 86 85 83  Temp: 98 F (36.7 C) 98.6 F (37 C) 98.3 F (36.8 C) 98.5 F (36.9 C)  TempSrc: Oral Oral Oral Oral  Resp: 16 16 16 18   Height:      Weight:      SpO2: 91% 95% 97% 97%   Body mass index is 31.03 kg/(m^2). Gen:  WD/WN, NAD Head: [place]/AT, No temporalis wasting. Prominent temp pulse not noted. Ear/Nose/Throat: Hearing grossly intact, nares w/o erythema or drainage, oropharynx w/o Erythema/Exudate Eyes: PERRLA, EOMI.  Neck: Supple, no nuchal rigidity.  No bruit or JVD.  Pulmonary:  Good air movement, clear to auscultation bilaterally.  Cardiac: RRR, normal S1, S2, no Murmurs, rubs or gallops. Vascular:  Vessel Right Left  Radial Palpable Palpable  Ulnar Palpable Palpable  Brachial Palpable Palpable  Carotid Palpable, without bruit Palpable, without bruit  Aorta Not palpable N/A  Femoral Palpable Palpable  Popliteal Palpable Palpable  PT Palpable Palpable  DP Palpable Palpable   Gastrointestinal: soft, non-tender/non-distended. No guarding/reflex. No masses, surgical incisions, or scars. Musculoskeletal: M/S 5/5 throughout.  Extremities without ischemic  changes.  No deformity or atrophy. No edema. Neurologic: CN 2-12 intact. Pain and light touch intact in extremities.  Symmetrical.  Speech is fluent. Motor exam as listed above. Psychiatric: Judgment intact, Mood & affect appropriate for pt's clinical situation. Dermatologic: No rashes or ulcers noted.  No cellulitis or open wounds. Lymph : No Cervical, Axillary, or Inguinal lymphadenopathy.   CBC Lab Results  Component Value Date   WBC 11.4* 02/16/2016   HGB 6.9* 02/16/2016   HCT 20.7* 02/16/2016   MCV 90.6 02/16/2016   PLT 134* 02/16/2016    BMET    Component Value Date/Time   NA 140 02/12/2016 0247   NA 141 02/10/2015   NA 139 08/25/2013 1224   K 3.5 02/12/2016 0247  K 4.4 08/25/2013 1224   CL 113* 02/12/2016 0247   CL 109* 08/25/2013 1224   CO2 22 02/12/2016 0247   CO2 27 08/25/2013 1224   GLUCOSE 98 02/12/2016 0247   GLUCOSE 125* 08/25/2013 1224   BUN 7 02/12/2016 0247   BUN 13 02/10/2015   BUN 15 08/25/2013 1224   CREATININE 0.68 02/12/2016 0247   CREATININE 1.0 02/10/2015   CREATININE 0.92 08/25/2013 1224   CALCIUM 7.8* 02/12/2016 0247   CALCIUM 9.0 08/25/2013 1224   GFRNONAA >60 02/12/2016 0247   GFRNONAA >60 08/25/2013 1224   GFRAA >60 02/12/2016 0247   GFRAA >60 08/25/2013 1224   Estimated Creatinine Clearance: 115.8 mL/min (by C-G formula based on Cr of 0.68).  COAG Lab Results  Component Value Date   INR 1.28 02/11/2016   INR 1.3 01/01/2013   INR 1.4 12/31/2012    Radiology Dg Shoulder Right  02/11/2016  CLINICAL DATA:  Inpatient fell today while going to the bathroom, complains of right shoulder pain, knot on shoulder, laceration to face, unable to lift right arm EXAM: RIGHT SHOULDER - 2+ VIEW COMPARISON:  Right shoulder MRI, 08/27/2013 FINDINGS: No fracture. No bone lesion. Glenohumeral joint is normally spaced and aligned. Mild AC joint osteoarthritis. Soft tissues are unremarkable. IMPRESSION: No fracture or dislocation. Electronically Signed    By: Lajean Manes M.D.   On: 02/11/2016 19:20   Ct Head Wo Contrast  02/11/2016  CLINICAL DATA:  Initial evaluation for acute syncope with forehead trauma. EXAM: CT HEAD WITHOUT CONTRAST TECHNIQUE: Contiguous axial images were obtained from the base of the skull through the vertex without intravenous contrast. COMPARISON:  Prior study from 07/19/2014. FINDINGS: Small contusion at the right forehead present. Scalp soft tissues otherwise within normal limits. No acute abnormality about the orbits. Mild mucosal thickening within the ethmoidal air cells and sphenoid sinuses, likely chronic. Paranasal sinuses are otherwise clear. No mastoid effusion. Calvarium intact. Probable remote posttraumatic deformity noted at the left orbital floor. Mild atrophy with chronic small vessel ischemic disease. 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 No acute large vessel territory infarct. No intracranial hemorrhage. No mass lesion, midline shift or mass effect. No hydrocephalus. No extra-axial fluid collection. IMPRESSION: 1. No acute intracranial process. 2. Small scalp contusion/laceration at the right forehead. 3. Cerebral atrophy with mild chronic small vessel ischemic disease. Electronically Signed   By: Jeannine Boga M.D.   On: 02/11/2016 07:03   Nm Gi Blood Loss  02/15/2016  CLINICAL DATA:  GI bleed. EXAM: NUCLEAR MEDICINE GASTROINTESTINAL BLEEDING SCAN TECHNIQUE: Sequential abdominal images were obtained following intravenous administration of Tc-31m labeled red blood cells. RADIOPHARMACEUTICALS:  18.7 mCi Tc-65m in-vitro labeled red cells. COMPARISON:  CT 02/11/2016. FINDINGS: No evidence of GI bleed.  Exam appears normal. IMPRESSION: Normal exam. Electronically Signed   By: Marcello Moores  Register   On: 02/15/2016 12:41   Ct Abdomen Pelvis W Contrast  02/11/2016  CLINICAL DATA:  Fall, anemia, rectal bleeding EXAM: CT ABDOMEN AND PELVIS WITH CONTRAST TECHNIQUE: Multidetector CT  imaging of the abdomen and pelvis was performed using the standard protocol following bolus administration of intravenous contrast. CONTRAST:  100 cc Isovue 370 COMPARISON:  12/31/2012 FINDINGS: Lower chest: Atherosclerosis of the lower thoracic aorta. Coronary atherosclerosis noted as well. Calcifications of the mitral and aortic valves. Normal heart size. No pericardial or pleural effusion. Right lower chest demonstrates pleural thickening with small punctate pleural calcifications, suspect sequelae from prior inflammation/ infection or trauma. Degenerative changes noted of  the thoracic spine with large osteophytes on the right Hepatobiliary: Micro nodular hepatic surface compatible with background cirrhosis. Small 6 mm hepatic dome hypodensity, image 13 suspect small cyst. Hypertrophy noted of the left lobe. Hepatic and portal veins remain patent. No biliary dilatation. Gallbladder is collapsed but does contain a small punctate calcified gallstone measuring 3 mm, image 21. Pancreas: No mass, inflammatory changes, or other significant abnormality. Spleen: Within normal limits in size and appearance. Adrenals/Urinary Tract: Normal adrenal glands. Small bilateral hypodense cortical renal cysts, 10 mm or less in size. No renal obstruction or hydronephrosis. No obstructing ureteral calculus. Ureters are symmetric and decompressed bilaterally. Stomach/Bowel: Negative for bowel obstruction, significant dilatation, ileus, or free air. Cecum is high in the right upper quadrant. Normal appendix demonstrated containing contrast, retrocecal in position. left descending colon and sigmoid diverticulosis without acute inflammatory process. No fluid collection or abscess. Negative for ascites. Vascular/Lymphatic: Abdominal atherosclerosis noted. Negative for aneurysm. No occlusive process. No adenopathy. Reproductive: No mass or other significant abnormality. Other: No inguinal adenopathy. Small right inguinal fat containing  hernia. Intact abdominal wall. No ventral hernia. Musculoskeletal:  Diffuse degenerative changes of the spine. IMPRESSION: Hepatic cirrhosis and scattered small hepatic hypodensities, suspect small hepatic cysts. Incidental cholelithiasis without biliary dilatation or cholecystitis. Aortic atherosclerosis without aneurysm Diverticulosis without acute inflammatory process small fat containing right inguinal hernia Electronically Signed   By: Jerilynn Mages.  Shick M.D.   On: 02/11/2016 09:22    Assessment/Plan 1. Lower GI bleeding. Likely diverticular in nature. Given the unclear location of the diverticular bleed, further localization will be necessary before embolization is likely to be of benefit. His bleeding scan yesterday was negative, but with his drop in hemoglobin I discussed the case with Dr. Margaretmary Eddy and would recommend a repeat bleeding scan to try to localize the source.  If the source is found, I believe embolization would provide a good treatment option and one that is far less invasive than resection.  Discussed 5-10% risk of recurrent or continued bleeding, 1-2% risk of ischemia, and other complications such as nephrotoxicity and bleeding. Will await the results of the bleeding scan for localization. 2. Tobacco use. Increases risk of peripheral arterial disease and pulmonary disease. Cessation would be of benefit.   Hillel Card, MD  02/16/2016 5:05 PM

## 2016-02-16 NOTE — Progress Notes (Signed)
Pt returned from testing, report given to charge nurse from [place] that pt has lingering amounts of medication/contrast from yesterday in colon, unable to complete testing at this time. Plan to repeat test tomorrow. Plan to continue with transfusion overnight.

## 2016-02-16 NOTE — Progress Notes (Signed)
Paged MD to notify pt's Hgb dropped to 6.9. New orders to infuse 2 more units per Dr. Margaretmary Eddy. Also new consult entered  for Vascular. Paged Dr. Allen Norris to notify of results.

## 2016-02-16 NOTE — Progress Notes (Addendum)
  SUBJECTIVE: The patient was a little dizzy this morning when up to bathroom. He says he passed a small amt dark blood. He has received blood transfusions, but Hgb is down to 7.4 this morning.   Filed Vitals:   02/15/16 1850 02/15/16 1935 02/16/16 0406 02/16/16 0757  BP: 115/62 103/62 114/58 122/59  Pulse: 80 74 86 85  Temp: 98.2 F (36.8 C) 98 F (36.7 C) 98.6 F (37 C) 98.3 F (36.8 C)  TempSrc: Oral Oral Oral Oral  Resp: 19 16 16 16   Height:      Weight:      SpO2: 99% 91% 95% 97%    Intake/Output Summary (Last 24 hours) at 02/16/16 1134 Last data filed at 02/16/16 0958  Gross per 24 hour  Intake   1480 ml  Output    602 ml  Net    878 ml    LABS: Basic Metabolic Panel: No results for input(s): NA, K, CL, CO2, GLUCOSE, BUN, CREATININE, CALCIUM, MG, PHOS in the last 72 hours. Liver Function Tests: No results for input(s): AST, ALT, ALKPHOS, BILITOT, PROT, ALBUMIN in the last 72 hours. No results for input(s): LIPASE, AMYLASE in the last 72 hours. CBC:  Recent Labs  02/15/16 0533 02/15/16 2003 02/16/16 0523  WBC 12.9*  --  11.4*  HGB 7.1* 8.0* 7.4*  HCT 21.2* 22.8* 21.8*  MCV 94.8  --  90.6  PLT 167  --  134*   Cardiac Enzymes: No results for input(s): CKTOTAL, CKMB, CKMBINDEX, TROPONINI in the last 72 hours. BNP: Invalid input(s): POCBNP D-Dimer: No results for input(s): DDIMER in the last 72 hours. Hemoglobin A1C: No results for input(s): HGBA1C in the last 72 hours. Fasting Lipid Panel: No results for input(s): CHOL, HDL, LDLCALC, TRIG, CHOLHDL, LDLDIRECT in the last 72 hours. Thyroid Function Tests: No results for input(s): TSH, T4TOTAL, T3FREE, THYROIDAB in the last 72 hours.  Invalid input(s): FREET3 Anemia Panel: No results for input(s): VITAMINB12, FOLATE, FERRITIN, TIBC, IRON, RETICCTPCT in the last 72 hours.   PHYSICAL EXAM General: Well developed, well nourished, in no acute distress HEENT:  Normocephalic and atramatic Neck:  No JVD.   Lungs: Clear bilaterally to auscultation and percussion. Heart: HRRR . 3/6 systolic murmur Abdomen: Bowel sounds are positive, abdomen soft and non-tender  Msk:  Back normal, normal gait. Normal strength and tone for age. Extremities: No clubbing, cyanosis or edema.   Neuro: Alert and oriented X 3. Psych:  Good affect, responds appropriately  TELEMETRY: not on tele  ASSESSMENT AND PLAN: Syncope in the setting of anemia. Patient was thought to be in second degree AV block, but we did not note this. He has been normal sinus rhythm and pt is not a candidate for a pacemaker. He has no chest pain. An echo revealed EF of 55%, mild MR, mild PR, trivial AR and grade 1 diastolic dysfunction. Probable continued GI blood loss. Advise outpatient Leane Call to rule out CAD to our office after discharge.  Active Problems:   GI bleed   Blood in stool   Esophageal varices without bleeding ([place])   Esophagogastric ulcer   Duodenitis   Gastrointestinal hemorrhage associated with anorectal source    Daune Perch, NP 02/16/2016 11:34 AM

## 2016-02-16 NOTE — Plan of Care (Signed)
Problem: Food- and Nutrition-Related Knowledge Deficit (NB-1.1) Goal: Nutrition education Formal process to instruct or train a patient/client in a skill or to impart knowledge to help patients/clients voluntarily manage or modify food choices and eating behavior to maintain or improve health.  Outcome: Completed/Met Date Met:  02/16/16   Nutrition Education Note  RD consulted for nutrition education regarding a Low Fiber/Low Residue diet secondary to diverticulosis and anemia.  RD provided "Low Fiber Nutrition Therapy" handout from the Academy of [employer]. Discussed nutrition therapy to reduce the irritation of the gastrointestinal tract to promote healing. Provided list of recommended low fiberous foods in comparison to foods with high amounts of fiber, as well as a recommended sample day. Teach back method used.  Expect good compliance.  Body mass index is 31.03 kg/(m^2).   Current diet order is Soft, patient is consuming approximately 50-100% of meals at this time. Pt reports tolerating grits, white toast, cheese omelette, and milk for breakfast but did not eat much lunch as he ate breakfast late and did not want to eat much for lunch. Labs and medications reviewed. RD contact information provided. If additional nutrition issues arise, please re-consult RD.   Craig Maldonado, RD, LDN Pager [phone] Weekend/On-Call Pager [phone]

## 2016-02-17 ENCOUNTER — Inpatient Hospital Stay: Payer: Medicare Other

## 2016-02-17 ENCOUNTER — Encounter
Admission: EM | Disposition: A | Discharge status: Home / Self Care | Payer: Self-pay | Source: Home / Self Care | Attending: Internal Medicine

## 2016-02-17 ENCOUNTER — Encounter: Payer: Self-pay | Admitting: Gastroenterology

## 2016-02-17 HISTORY — PX: PERIPHERAL VASCULAR CATHETERIZATION: SHX172C

## 2016-02-17 LAB — CBC
HCT: 24 % — ABNORMAL LOW (ref 40.0–52.0)
HEMATOCRIT: 22.5 % — AB (ref 40.0–52.0)
Hemoglobin: 8 g/dL — ABNORMAL LOW (ref 13.0–18.0)
Hemoglobin: 7.3 g/dL — ABNORMAL LOW (ref 13.0–18.0)
MCH: 28.8 pg (ref 26.0–34.0)
MCH: 28.7 pg (ref 26.0–34.0)
MCHC: 33.4 g/dL (ref 32.0–36.0)
MCHC: 32.4 g/dL (ref 32.0–36.0)
MCV: 86.2 fL (ref 80.0–100.0)
MCV: 88.6 fL (ref 80.0–100.0)
PLATELETS: 155 10*3/uL (ref 150–440)
Platelets: 219 10*3/uL (ref 150–440)
RBC: 2.78 MIL/uL — ABNORMAL LOW (ref 4.40–5.90)
RBC: 2.54 MIL/uL — ABNORMAL LOW (ref 4.40–5.90)
RDW: 20.3 % — AB (ref 11.5–14.5)
RDW: 21.1 % — AB (ref 11.5–14.5)
WBC: 12.6 10*3/uL — AB (ref 3.8–10.6)
WBC: 18.9 10*3/uL — AB (ref 3.8–10.6)

## 2016-02-17 LAB — GLUCOSE, CAPILLARY
GLUCOSE-CAPILLARY: 132 mg/dL — AB (ref 65–99)
GLUCOSE-CAPILLARY: 135 mg/dL — AB (ref 65–99)
Glucose-Capillary: 128 mg/dL — ABNORMAL HIGH (ref 65–99)
Glucose-Capillary: 120 mg/dL — ABNORMAL HIGH (ref 65–99)

## 2016-02-17 LAB — PREPARE RBC (CROSSMATCH)

## 2016-02-17 SURGERY — VISCERAL ANGIOGRAPHY
Anesthesia: Moderate Sedation

## 2016-02-17 MED ORDER — DEXTROSE 5 % IV SOLN
1.5000 g | Freq: Once | INTRAVENOUS | Status: AC
  Administered 2016-02-17: 1.5 g via INTRAVENOUS

## 2016-02-17 MED ORDER — IOPAMIDOL (ISOVUE-300) INJECTION 61%
INTRAVENOUS | Status: DC | PRN
  Administered 2016-02-17: 65 mL via INTRA_ARTERIAL

## 2016-02-17 MED ORDER — MIDAZOLAM HCL 2 MG/2ML IJ SOLN
INTRAMUSCULAR | Status: DC | PRN
  Administered 2016-02-17: 2 mg via INTRAVENOUS
  Administered 2016-02-17: 1 mg via INTRAVENOUS

## 2016-02-17 MED ORDER — DIPHENHYDRAMINE HCL 50 MG/ML IJ SOLN
INTRAMUSCULAR | Status: DC | PRN
  Administered 2016-02-17: 50 mg via INTRAVENOUS

## 2016-02-17 MED ORDER — HEPARIN SODIUM (PORCINE) 1000 UNIT/ML IJ SOLN
INTRAMUSCULAR | Status: AC
  Filled 2016-02-17: qty 1

## 2016-02-17 MED ORDER — TECHNETIUM TC 99M-LABELED RED BLOOD CELLS IV KIT
20.0000 | PACK | Freq: Once | INTRAVENOUS | Status: AC | PRN
  Administered 2016-02-17: 21.016 via INTRAVENOUS

## 2016-02-17 MED ORDER — LIDOCAINE-EPINEPHRINE (PF) 1 %-1:200000 IJ SOLN
INTRAMUSCULAR | Status: AC
  Filled 2016-02-17: qty 30

## 2016-02-17 MED ORDER — SODIUM CHLORIDE 0.9 % IV SOLN
INTRAVENOUS | Status: DC
  Administered 2016-02-17: 13:00:00 via INTRAVENOUS

## 2016-02-17 MED ORDER — HEPARIN (PORCINE) IN NACL 2-0.9 UNIT/ML-% IJ SOLN
INTRAMUSCULAR | Status: AC
  Filled 2016-02-17: qty 1000

## 2016-02-17 MED ORDER — FENTANYL CITRATE (PF) 100 MCG/2ML IJ SOLN
INTRAMUSCULAR | Status: AC
  Filled 2016-02-17: qty 2

## 2016-02-17 MED ORDER — FENTANYL CITRATE (PF) 100 MCG/2ML IJ SOLN
INTRAMUSCULAR | Status: DC | PRN
  Administered 2016-02-17 (×2): 50 ug via INTRAVENOUS

## 2016-02-17 MED ORDER — MIDAZOLAM HCL 5 MG/5ML IJ SOLN
INTRAMUSCULAR | Status: AC
  Filled 2016-02-17: qty 5

## 2016-02-17 MED ORDER — DIPHENHYDRAMINE HCL 50 MG/ML IJ SOLN
INTRAMUSCULAR | Status: AC
  Filled 2016-02-17: qty 1

## 2016-02-17 MED ORDER — LIDOCAINE-EPINEPHRINE (PF) 1 %-1:200000 IJ SOLN
INTRAMUSCULAR | Status: DC | PRN
  Administered 2016-02-17: 10 mL via INTRADERMAL

## 2016-02-17 MED ORDER — SODIUM CHLORIDE 0.9 % IV SOLN
Freq: Once | INTRAVENOUS | Status: AC
  Administered 2016-02-17: 21:00:00 via INTRAVENOUS

## 2016-02-17 SURGICAL SUPPLY — 12 items
BLOCK BEAD 300-500 MIC (Vascular Products) ×3 IMPLANT
CATH C2 65CM (CATHETERS) ×3 IMPLANT
CATH PIG 70CM (CATHETERS) ×3 IMPLANT
DEVICE STARCLOSE SE CLOSURE (Vascular Products) ×3 IMPLANT
DEVICE TORQUE (MISCELLANEOUS) ×3 IMPLANT
GLIDEWIRE STIFF .35X180X3 HYDR (WIRE) ×3 IMPLANT
MICROCATH PROGREAT 2.8F 110 CM (CATHETERS) ×3 IMPLANT
PACK ANGIOGRAPHY (CUSTOM PROCEDURE TRAY) ×3 IMPLANT
SHEATH BRITE TIP 5FRX11 (SHEATH) ×3 IMPLANT
SYR MEDRAD MARK V 150ML (SYRINGE) ×3 IMPLANT
TUBING CONTRAST HIGH PRESS 72 (TUBING) ×3 IMPLANT
WIRE J 3MM .035X145CM (WIRE) ×3 IMPLANT

## 2016-02-17 NOTE — Progress Notes (Signed)
Pt with positive bleeding scan. rn paged dr patel. No response. Page dr dew. md will try vascularization today this afternoon

## 2016-02-17 NOTE — Progress Notes (Signed)
Pt passed large bloody stool. rn contacted dr dew who reported bleeding should decrease by morning

## 2016-02-17 NOTE — Progress Notes (Signed)
rn spoke with dr Verdell Carmine [id number] volume bloody stools with clots. Pale. md reports obtain a cbc

## 2016-02-17 NOTE — H&P (Signed)
  [facility] History & Physical Update  The patient was interviewed and re-examined.  The patient's previous History and Physical has been reviewed and is unchanged.  The nuclear medicine scan showed active bleeding at the hepatic flexure, and he would benefit from embolization of this area. We plan to proceed with the scheduled procedure.  DEW,JASON, MD  02/17/2016, 10:51 AM

## 2016-02-17 NOTE — Progress Notes (Signed)
rn spoke with dr dew again. Large volume bloody stools with clots.md reports it will take atlease 8 hrs for embolization to establish

## 2016-02-17 NOTE — Op Note (Signed)
[facility] Percutaneous Study/Intervention Procedural Note     Surgeon(s): DEW,JASON  Assistants: none  Pre-operative Diagnosis: 1. Lower GI bleed with bleeding scan positive for active bleeding at the hepatic flexure 2. Alcoholic liver disease  Post-operative diagnosis: Same  Procedure(s) Performed: 1. Ultrasound guidance for vascular access  right femoral artery 2. Catheter placement into  tertiary branch of the SMA off of the middle colic vessel to the proximal transverse colon and hepatic flexure as well as a larger secondary branch of the SMA going directly to the hepatic flexure 3. Aortogram and selective angiogram of the  SMA angiogram including selective images of both of the above-mentioned branches 4. Microbead embolization of  the tertiary middle colic branch of the SMA with  0.75 cc of 300-500  polyvinyl alcohol beads.  5.  Microbead embolization of the secondary SMA branch to the hepatic flexure with 1.75 cc of 300-500  polyvinyl alcohol beads 6. StarClose closure device  right femoral artery  Anesthesia: Moderate conscious sedation for approximately  45 minutes using  3 mg of Versed and  100 mcg of Fentanyl  EBL:  40 cc  Fluoro Time:  8.8 minutes  Contrast:  65 cc  Indications: Patient is a  with brisk lower GI bleeding with resultant anemia. The patient has a nuclear medicine study showing  active bleeding at the hepatic flexure. The patient is brought in for angiography for further evaluation and potential treatment. Risks and benefits are discussed and informed consent is obtained  Procedure: The patient was identified and appropriate procedural time out was performed. The patient was then placed supine on the table and prepped and draped in the usual sterile fashion.Moderate conscious sedation was administered during a face to face encounter  with the patient throughout the procedure with my supervision of the RN administering medicines and monitoring the patient's vital signs, pulse oximetry, telemetry and mental status throughout from the start of the procedure until the patient was taken to the recovery room.  Ultrasound was used to evaluate the  right common femoral artery. It was patent . A digital ultrasound image was acquired. A Seldinger needle was used to access the  right common femoral artery under direct ultrasound guidance and a permanent image was performed. A 0.035 J wire was advanced without resistance and a 5Fr sheath was placed. Pigtail catheter was placed into the aorta and an AP aortogram was performed. This demonstrated  a solitary right kidney with a normal right renal artery and what appeared to be a normal celiac and SMA. No aorta or iliac stenosis was present. We transitioned to the lateral projection to cannulate the  SMA. A  C2 catheter was used to selectively cannulate the  SMA.  This demonstrated 2 branches fed the hepatic flexure area. The smaller of the 2 branches was a tertiary branch which was based off of a middle colic branch of the SMA that went to the proximal transverse colon and hepatic flexure. The larger of the 2 branches was a secondary branch off of the SMA proximal to the right colic branch that went directly to the hepatic flexure.. Based on his continued bleeding and the nuclear medicine study I elected to treat this area with embolization and plan to do embolization to both of these branches. I initially advanced the Pro-Great microcatheter out the  larger secondary branch of the SMA going directly into the hepatic flexure and instilled approximately  1.75 cc of 300-500  polyvinyl alcohol beads in  this location. Angiogram following this showed the main vessels to be open with less brisk filling. I then pulled back and cannulated the  middle colic artery and with the help of the pro-great microcatheter  was able to get into the branch going to the right into the proximal transverse colon and hepatic flexure. I was able to advance beyond the bifurcation of this vessel with the Pro-great microcatheter without difficulty in the branch going to the most proximal transverse colon and hepatic flexure and beyond a branch going more to the mid to distal transverse colon. Selective imaging was performed which showed  successful cannulation.  0.75 cc of 300-500  polyvinyl alcohol beads were deployed in the  smaller branch based off the middle colic artery of the SMA. Again, completion angiogram showed the main vessels to be open with less brisk filling. I elected to terminate the procedure. The diagnostic catheter was removed. StarClose closure device was deployed in usual fashion with excellent hemostatic result. The patient was taken to the recovery room in stable condition having tolerated the procedure well.     Findings: 2 branches fed the hepatic flexure area. The smaller of the 2 branches was a tertiary branch which was based off of a middle colic branch of the SMA that went to the proximal transverse colon and hepatic flexure. The larger of the 2 branches was a secondary branch off of the SMA proximal to the right colic branch that went directly to the hepatic flexure.  Disposition: Patient was taken to the recovery room in stable condition having tolerated the procedure well.  Complications: None  DEW,JASON 02/17/2016 2:32 PM

## 2016-02-17 NOTE — Care Management Important Message (Signed)
Important Message  Patient Details  Name: Craig Maldonado MRN: [id number] Date of Birth: Nov 15, 1951   Medicare Important Message Given:  Yes    Marshell Garfinkel, RN 02/17/2016, 11:28 AM

## 2016-02-17 NOTE — Progress Notes (Signed)
[place] at [place] NAME: Craig Maldonado    MR#:  [id number]  DATE OF BIRTH:  1951-04-30  SUBJECTIVE:  CHIEF COMPLAINT:  Patient had + bleeding scan, underwent embolization by vascular.  REVIEW OF SYSTEMS:  CONSTITUTIONAL: No fever, fatigue or weakness.  EYES: No blurred or double vision.  EARS, NOSE, AND THROAT: No tinnitus or ear pain.  RESPIRATORY: No cough, shortness of breath, wheezing or hemoptysis.  CARDIOVASCULAR: No chest pain, orthopnea, edema.  GASTROINTESTINAL: No nausea, vomiting, diarrhea or abdominal pain. Reporting bowel movements with blood 2 GENITOURINARY: No dysuria, hematuria.  ENDOCRINE: No polyuria, nocturia,  HEMATOLOGY: No anemia, easy bruising  SKIN: No rash or lesion. MUSCULOSKELETAL: No joint pain or arthritis.   NEUROLOGIC: No tingling, numbness, weakness.  PSYCHIATRY: No anxiety or depression.   DRUG ALLERGIES:   Allergies  Allergen Reactions  . Codeine Nausea And Vomiting    VITALS:  Blood pressure 122/67, pulse 86, temperature 98.6 F (37 C), temperature source Oral, resp. rate 19, height 5\' 11"  (1.803 m), weight 100.88 kg (222 lb 6.4 oz), SpO2 100 %.  PHYSICAL EXAMINATION:  GENERAL:  65 y.o.-year-old patient lying in the bed with no acute distress.  EYES: Pupils equal, round, reactive to light and accommodation. No scleral icterus. Extraocular muscles intact.  HEENT: Head atraumatic, normocephalic. Oropharynx and nasopharynx clear.  NECK:  Supple, no jugular venous distention. No thyroid enlargement, no tenderness.  LUNGS: Normal breath sounds bilaterally, no wheezing, rales,rhonchi or crepitation. No use of accessory muscles of respiration.  CARDIOVASCULAR: S1, S2 normal. No murmurs, rubs, or gallops.  ABDOMEN: Soft, nontender, nondistended. Bowel sounds present. No organomegaly or mass.  EXTREMITIES: No pedal edema, cyanosis, or clubbing.  NEUROLOGIC: Cranial nerves II through XII are  intact. Muscle strength 5/5 in all extremities. Sensation intact. Gait not checked.  PSYCHIATRIC: The patient is alert and oriented x 3.  SKIN: No obvious rash, lesion, or ulcer.    LABORATORY PANEL:   CBC  Recent Labs Lab 02/17/16 0530  WBC 12.6*  HGB 8.0*  HCT 24.0*  PLT 155   ------------------------------------------------------------------------------------------------------------------  Chemistries   Recent Labs Lab 02/11/16 0606 02/12/16 0247  NA 137 140  K 3.6 3.5  CL 109 113*  CO2 23 22  GLUCOSE 232* 98  BUN 9 7  CREATININE 0.82 0.68  CALCIUM 8.0* 7.8*  AST 36  --   ALT 23  --   ALKPHOS 103  --   BILITOT 0.3  --    ------------------------------------------------------------------------------------------------------------------  Cardiac Enzymes No results for input(s): TROPONINI in the last 168 hours. ------------------------------------------------------------------------------------------------------------------  RADIOLOGY:  Nm Gi Blood Loss  02/17/2016  CLINICAL DATA:  Clinical lower GI bleed with initial negative bleeding scan on 02/15/2016. Delayed static image yesterday demonstrated activity throughout much of the colon. Repeat tagged red blood cell scan is now performed to try to localize the bleeding source. EXAM: NUCLEAR MEDICINE GASTROINTESTINAL BLEEDING SCAN TECHNIQUE: Sequential abdominal images were obtained following intravenous administration of Tc-32m labeled red blood cells. RADIOPHARMACEUTICALS:  21 mCi Tc-45m in-vitro labeled red cells. COMPARISON:  02/15/2016 FINDINGS: Imaging was performed of the abdomen and pelvis over 1 hour. Towards the end of the imaging, positive bleeding is identified at the level of the hepatic flexure of the colon with both antegrade and retrograde transit noted in the colon. IMPRESSION: GI bleed scan is positive for active bleeding at the level of the hepatic flexure of the colon with both antegrade and retrograde  transit noted in the colon. Electronically Signed   By: Aletta Edouard M.D.   On: 02/17/2016 08:51    EKG:   Orders placed or performed during the hospital encounter of 02/11/16  . EKG 12-Lead  . EKG 12-Lead    ASSESSMENT AND PLAN:   1. Acute GI bleed. Due to diverticular bleed- s/p microebolization, Follow h/h in am transfue is neg  2. 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 Likely secondary to alcohol.hep c neg  3. Tobacco abuse smoking cessation counseling done   4. Alcohol abuse. on Ciwa protocol. No withdrawal noticed  5. Right shoulder pain, neg x-ray. Pt refused PT  6. Elevated bg no evidence dm     All the records are reviewed and case discussed with Care Management/Social Workerr. Management plans discussed with the patient, son and brother at bedside. They all verbalized understanding of the plan   CODE STATUS: fc  TOTAL TIME TAKING CARE OF THIS PATIENT: 24min  Mahkayla Preece M.D on 02/17/2016 at 4:21 PM  Between 7am to 6pm - Pager - [phone] After 6pm go to [email or web address] - password [email or web address] [facility]  Office  [phone]  CC: Primary care physician; Ollen Bowl, MD

## 2016-02-18 ENCOUNTER — Encounter: Payer: Self-pay | Admitting: Gastroenterology

## 2016-02-18 LAB — BASIC METABOLIC PANEL
Anion gap: 6 (ref 5–15)
BUN: 17 mg/dL (ref 6–20)
CHLORIDE: 108 mmol/L (ref 101–111)
CO2: 23 mmol/L (ref 22–32)
CREATININE: 0.89 mg/dL (ref 0.61–1.24)
Calcium: 7.9 mg/dL — ABNORMAL LOW (ref 8.9–10.3)
Glucose, Bld: 146 mg/dL — ABNORMAL HIGH (ref 65–99)
Potassium: 4.3 mmol/L (ref 3.5–5.1)
SODIUM: 137 mmol/L (ref 135–145)

## 2016-02-18 LAB — TYPE AND SCREEN
ABO/RH(D): O POS
Antibody Screen: NEGATIVE
UNIT DIVISION: 0
UNIT DIVISION: 0
Unit division: 0
Unit division: 0
Unit division: 0

## 2016-02-18 LAB — CBC
HCT: 24.7 % — ABNORMAL LOW (ref 40.0–52.0)
Hemoglobin: 8.2 g/dL — ABNORMAL LOW (ref 13.0–18.0)
MCH: 28.5 pg (ref 26.0–34.0)
MCHC: 33.2 g/dL (ref 32.0–36.0)
MCV: 85.8 fL (ref 80.0–100.0)
PLATELETS: 185 10*3/uL (ref 150–440)
RBC: 2.88 MIL/uL — AB (ref 4.40–5.90)
RDW: 22.2 % — AB (ref 11.5–14.5)
WBC: 30.1 10*3/uL — AB (ref 3.8–10.6)

## 2016-02-18 LAB — URINALYSIS COMPLETE WITH MICROSCOPIC (ARMC ONLY)
Bacteria, UA: NONE SEEN
Bilirubin Urine: NEGATIVE
Glucose, UA: NEGATIVE mg/dL
Hgb urine dipstick: NEGATIVE
KETONES UR: NEGATIVE mg/dL
Leukocytes, UA: NEGATIVE
Nitrite: NEGATIVE
PH: 5 (ref 5.0–8.0)
PROTEIN: NEGATIVE mg/dL
Specific Gravity, Urine: 1.019 (ref 1.005–1.030)

## 2016-02-18 LAB — GLUCOSE, CAPILLARY
GLUCOSE-CAPILLARY: 137 mg/dL — AB (ref 65–99)
GLUCOSE-CAPILLARY: 106 mg/dL — AB (ref 65–99)
Glucose-Capillary: 172 mg/dL — ABNORMAL HIGH (ref 65–99)

## 2016-02-18 NOTE — Progress Notes (Signed)
[place] at [place] NAME: Craig Maldonado    MR#:  [id number]  DATE OF BIRTH:  17-Jul-1951  SUBJECTIVE:  CHIEF COMPLAINT:  Patient underwent embolization yesterday. Had bleeding last night and again this afternoon. His WBC count is elevated REVIEW OF SYSTEMS:  CONSTITUTIONAL: No fever, fatigue or weakness.  EYES: No blurred or double vision.  EARS, NOSE, AND THROAT: No tinnitus or ear pain.  RESPIRATORY: No cough, shortness of breath, wheezing or hemoptysis.  CARDIOVASCULAR: No chest pain, orthopnea, edema.  GASTROINTESTINAL: No nausea, vomiting, diarrhea or abdominal pain.  Bright red blood per rectum GENITOURINARY: No dysuria, hematuria.  ENDOCRINE: No polyuria, nocturia,  HEMATOLOGY: No anemia, easy bruising  SKIN: No rash or lesion. MUSCULOSKELETAL: No joint pain or arthritis.   NEUROLOGIC: No tingling, numbness, weakness.  PSYCHIATRY: No anxiety or depression.   DRUG ALLERGIES:   Allergies  Allergen Reactions  . Codeine Nausea And Vomiting    VITALS:  Blood pressure 133/72, pulse 93, temperature 98.8 F (37.1 C), temperature source Oral, resp. rate 18, height 5\' 11"  (1.803 m), weight 100.88 kg (222 lb 6.4 oz), SpO2 100 %.  PHYSICAL EXAMINATION:  GENERAL:  65 y.o.-year-old patient lying in the bed with no acute distress.  EYES: Pupils equal, round, reactive to light and accommodation. No scleral icterus. Extraocular muscles intact.  HEENT: Head atraumatic, normocephalic. Oropharynx and nasopharynx clear.  NECK:  Supple, no jugular venous distention. No thyroid enlargement, no tenderness.  LUNGS: Normal breath sounds bilaterally, no wheezing, rales,rhonchi or crepitation. No use of accessory muscles of respiration.  CARDIOVASCULAR: S1, S2 normal. No murmurs, rubs, or gallops.  ABDOMEN: Soft, nontender, nondistended. Bowel sounds present. No organomegaly or mass.  EXTREMITIES: No pedal edema, cyanosis, or clubbing.   NEUROLOGIC: Cranial nerves II through XII are intact. Muscle strength 5/5 in all extremities. Sensation intact. Gait not checked.  PSYCHIATRIC: The patient is alert and oriented x 3.  SKIN: No obvious rash, lesion, or ulcer.    LABORATORY PANEL:   CBC  Recent Labs Lab 02/18/16 0519  WBC 30.1*  HGB 8.2*  HCT 24.7*  PLT 185   ------------------------------------------------------------------------------------------------------------------  Chemistries   Recent Labs Lab 02/18/16 0519  NA 137  K 4.3  CL 108  CO2 23  GLUCOSE 146*  BUN 17  CREATININE 0.89  CALCIUM 7.9*   ------------------------------------------------------------------------------------------------------------------  Cardiac Enzymes No results for input(s): TROPONINI in the last 168 hours. ------------------------------------------------------------------------------------------------------------------  RADIOLOGY:  Nm Gi Blood Loss  02/17/2016  CLINICAL DATA:  Clinical lower GI bleed with initial negative bleeding scan on 02/15/2016. Delayed static image yesterday demonstrated activity throughout much of the colon. Repeat tagged red blood cell scan is now performed to try to localize the bleeding source. EXAM: NUCLEAR MEDICINE GASTROINTESTINAL BLEEDING SCAN TECHNIQUE: Sequential abdominal images were obtained following intravenous administration of Tc-46m labeled red blood cells. RADIOPHARMACEUTICALS:  21 mCi Tc-82m in-vitro labeled red cells. COMPARISON:  02/15/2016 FINDINGS: Imaging was performed of the abdomen and pelvis over 1 hour. Towards the end of the imaging, positive bleeding is identified at the level of the hepatic flexure of the colon with both antegrade and retrograde transit noted in the colon. IMPRESSION: GI bleed scan is positive for active bleeding at the level of the hepatic flexure of the colon with both antegrade and retrograde transit noted in the colon. Electronically Signed   By: Aletta Edouard M.D.   On: 02/17/2016 08:51    EKG:   Orders placed  or performed during the hospital encounter of 02/11/16  . EKG 12-Lead  . EKG 12-Lead    ASSESSMENT AND PLAN:   1. Acute GI bleed. Due to diverticular bleed- s/p microebolization, Continues to have a bleed I will have to ask surgery to evaluate due to persistent bleed  in presence of embolization  2. 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 Likely secondary to alcohol.hep c neg  3. Tobacco abuse smoking cessation counseling done   4. Alcohol abuse. on Ciwa protocol. No withdrawal noticed  5. Right shoulder pain, neg x-ray. Pt refused PT  6. Elevated bg no evidence dm hemoglobin A1c 5 range     All the records are reviewed and case discussed with Care Management/Social Workerr. Management plans discussed with the patient, son and brother at bedside. They all verbalized understanding of the plan   CODE STATUS: fc  TOTAL TIME TAKING CARE OF THIS PATIENT: 33min  Terren Jandreau M.D on 02/18/2016 at 2:48 PM  Between 7am to 6pm - Pager - [phone] After 6pm go to [email or web address] - password [email or web address] [facility]  Office  [phone]  CC: Primary care physician; Ollen Bowl, MD

## 2016-02-18 NOTE — Progress Notes (Signed)
[facility]  Daily Progress Note   Subjective  - 1 Day Post-Op  Bleeding last night, none this am Pain improving but still present as is nausea  Objective Filed Vitals:   02/17/16 2050 02/17/16 2315 02/18/16 0444 02/18/16 0732  BP: 135/70 145/69 152/71 133/72  Pulse: 91 91 98 93  Temp: 98 F (36.7 C) 98.1 F (36.7 C) 98.4 F (36.9 C) 98.8 F (37.1 C)  TempSrc: Oral Oral Oral Oral  Resp: 18 18 18 18   Height:      Weight:      SpO2: 99% 100% 97% 100%    Intake/Output Summary (Last 24 hours) at 02/18/16 1031 Last data filed at 02/18/16 0444  Gross per 24 hour  Intake    302 ml  Output    300 ml  Net      2 ml    PULM  CTAB CV  RRR VASC  Access site C/D/I Abd soft  Laboratory CBC    Component Value Date/Time   WBC 30.1* 02/18/2016 0519   WBC 7.8 02/10/2015   WBC 15.2* 12/31/2012 1341   HGB 8.2* 02/18/2016 0519   HGB 14.6 12/31/2012 1341   HCT 24.7* 02/18/2016 0519   HCT 43.9 12/31/2012 1341   PLT 185 02/18/2016 0519   PLT 233 12/31/2012 1341    BMET    Component Value Date/Time   NA 137 02/18/2016 0519   NA 141 02/10/2015   NA 139 08/25/2013 1224   K 4.3 02/18/2016 0519   K 4.4 08/25/2013 1224   CL 108 02/18/2016 0519   CL 109* 08/25/2013 1224   CO2 23 02/18/2016 0519   CO2 27 08/25/2013 1224   GLUCOSE 146* 02/18/2016 0519   GLUCOSE 125* 08/25/2013 1224   BUN 17 02/18/2016 0519   BUN 13 02/10/2015   BUN 15 08/25/2013 1224   CREATININE 0.89 02/18/2016 0519   CREATININE 1.0 02/10/2015   CREATININE 0.92 08/25/2013 1224   CALCIUM 7.9* 02/18/2016 0519   CALCIUM 9.0 08/25/2013 1224   GFRNONAA >60 02/18/2016 0519   GFRNONAA >60 08/25/2013 1224   GFRAA >60 02/18/2016 0519   GFRAA >60 08/25/2013 1224    Assessment/Planning: POD #1 s/p mesenteric embolization   Doing well  No bleeding today  Abdominal pain not surprising and improving  Advance diet as tolerated    DEW,JASON  02/18/2016, 10:31 AM

## 2016-02-18 NOTE — Plan of Care (Signed)
Problem: Fluid Volume: Goal: Will show no signs and symptoms of excessive bleeding Outcome: Progressing No major blood. Minimal amount noted in stool

## 2016-02-18 NOTE — Progress Notes (Signed)
Pt complaining of shoulder pain. PRN given with good relief. Small amount of blood noted in stool.

## 2016-02-18 NOTE — Progress Notes (Signed)
SUBJECTIVE: doing much better   Filed Vitals:   02/17/16 2050 02/17/16 2315 02/18/16 0444 02/18/16 0732  BP: 135/70 145/69 152/71 133/72  Pulse: 91 91 98 93  Temp: 98 F (36.7 C) 98.1 F (36.7 C) 98.4 F (36.9 C) 98.8 F (37.1 C)  TempSrc: Oral Oral Oral Oral  Resp: 18 18 18 18   Height:      Weight:      SpO2: 99% 100% 97% 100%    Intake/Output Summary (Last 24 hours) at 02/18/16 0857 Last data filed at 02/18/16 0444  Gross per 24 hour  Intake    302 ml  Output    300 ml  Net      2 ml    LABS: Basic Metabolic Panel:  Recent Labs  02/18/16 0519  NA 137  K 4.3  CL 108  CO2 23  GLUCOSE 146*  BUN 17  CREATININE 0.89  CALCIUM 7.9*   Liver Function Tests: No results for input(s): AST, ALT, ALKPHOS, BILITOT, PROT, ALBUMIN in the last 72 hours. No results for input(s): LIPASE, AMYLASE in the last 72 hours. CBC:  Recent Labs  02/17/16 1902 02/18/16 0519  WBC 18.9* 30.1*  HGB 7.3* 8.2*  HCT 22.5* 24.7*  MCV 88.6 85.8  PLT 219 185   Cardiac Enzymes: No results for input(s): CKTOTAL, CKMB, CKMBINDEX, TROPONINI in the last 72 hours. BNP: Invalid input(s): POCBNP D-Dimer: No results for input(s): DDIMER in the last 72 hours. Hemoglobin A1C: No results for input(s): HGBA1C in the last 72 hours. Fasting Lipid Panel: No results for input(s): CHOL, HDL, LDLCALC, TRIG, CHOLHDL, LDLDIRECT in the last 72 hours. Thyroid Function Tests: No results for input(s): TSH, T4TOTAL, T3FREE, THYROIDAB in the last 72 hours.  Invalid input(s): FREET3 Anemia Panel: No results for input(s): VITAMINB12, FOLATE, FERRITIN, TIBC, IRON, RETICCTPCT in the last 72 hours.   PHYSICAL EXAM General: Well developed, well nourished, in no acute distress HEENT:  Normocephalic and atramatic Neck:  No JVD.  Lungs: Clear bilaterally to auscultation and percussion. Heart: HRRR . Normal S1 and S2 without gallops or murmurs.  Abdomen: Bowel sounds are positive, abdomen soft and non-tender    Msk:  Back normal, normal gait. Normal strength and tone for age. Extremities: No clubbing, cyanosis or edema.   Neuro: Alert and oriented X 3. Psych:  Good affect, responds appropriately  TELEMETRY: not on monitor  ASSESSMENT AND PLAN:  Craig Perch, NP Nurse Practitioner Addendum Cardiology Progress Notes 02/16/2016 11:33 AM    Expand All Collapse All    SUBJECTIVE: The patient was a little dizzy this morning when up to bathroom. He says he passed a small amt dark blood. He has received blood transfusions, but Hgb is down to 7.4 this morning.   Filed Vitals:   02/15/16 1850 02/15/16 1935 02/16/16 0406 02/16/16 0757  BP: 115/62 103/62 114/58 122/59  Pulse: 80 74 86 85  Temp: 98.2 F (36.8 C) 98 F (36.7 C) 98.6 F (37 C) 98.3 F (36.8 C)  TempSrc: Oral Oral Oral Oral  Resp: 19 16 16 16   Height:      Weight:      SpO2: 99% 91% 95% 97%    Intake/Output Summary (Last 24 hours) at 02/16/16 1134 Last data filed at 02/16/16 [id number]  Gross per 24 hour  Intake  1480 ml  Output  602 ml  Net  878 ml    LABS: Basic Metabolic Panel:  Recent Labs (last 2 labs)  No results for input(s): NA, K, CL, CO2, GLUCOSE, BUN, CREATININE, CALCIUM, MG, PHOS in the last 72 hours.   Liver Function Tests:  Recent Labs (last 2 labs)     No results for input(s): AST, ALT, ALKPHOS, BILITOT, PROT, ALBUMIN in the last 72 hours.    Recent Labs (last 2 labs)     No results for input(s): LIPASE, AMYLASE in the last 72 hours.   CBC:  Recent Labs (last 2 labs)      Recent Labs  02/15/16 0533 02/15/16 2003 02/16/16 0523  WBC 12.9* --  11.4*  HGB 7.1* 8.0* 7.4*  HCT 21.2* 22.8* 21.8*  MCV 94.8 --  90.6  PLT 167 --  134*     Cardiac Enzymes:  Recent Labs (last 2 labs)     No results for input(s): CKTOTAL, CKMB, CKMBINDEX, TROPONINI in the last 72 hours.   BNP:  Recent Labs (last 2 labs)      Invalid input(s): POCBNP   D-Dimer:  Recent Labs (last 2 labs)     No results for input(s): DDIMER in the last 72 hours.   Hemoglobin A1C:  Recent Labs (last 2 labs)     No results for input(s): HGBA1C in the last 72 hours.   Fasting Lipid Panel:  Recent Labs (last 2 labs)     No results for input(s): CHOL, HDL, LDLCALC, TRIG, CHOLHDL, LDLDIRECT in the last 72 hours.   Thyroid Function Tests:  Recent Labs (last 2 labs)     No results for input(s): TSH, T4TOTAL, T3FREE, THYROIDAB in the last 72 hours.  Invalid input(s): FREET3   Anemia Panel:  Recent Labs (last 2 labs)     No results for input(s): VITAMINB12, FOLATE, FERRITIN, TIBC, IRON, RETICCTPCT in the last 72 hours.     PHYSICAL EXAM General: Well developed, well nourished, in no acute distress HEENT: Normocephalic and atramatic Neck: No JVD.  Lungs: Clear bilaterally to auscultation and percussion. Heart: HRRR . 3/6 systolic murmur Abdomen: Bowel sounds are positive, abdomen soft and non-tender  Msk: Back normal, normal gait. Normal strength and tone for age. Extremities: No clubbing, cyanosis or edema.  Neuro: Alert and oriented X 3. Psych: Good affect, responds appropriately  TELEMETRY: not on tele  ASSESSMENT AND PLAN: Syncope in the setting of anemia. Patient was thought to be in second degree AV block, but we did not note this. He has been normal sinus rhythm and pt is not a candidate for a pacemaker. He has no chest pain. An echo revealed EF of 55%, mild MR, mild PR, trivial AR and grade 1 diastolic dysfunction. Probable continued GI blood loss. Advise outpatient Leane Call to rule out CAD to our office after discharge.  Active Problems:  GI bleed  Blood in stool  Esophageal varices without bleeding ([place])  Esophagogastric ulcer  Duodenitis  Gastrointestinal hemorrhage associated with anorectal source    Craig Perch, NP 02/16/2016 11:34 AM       Revision History      Date/Time User Provider Type Action   02/17/2016 8:52 AM Craig Perch, NP Nurse Practitioner Addend   02/16/2016 11:42 AM Craig Perch, NP Nurse Practitioner Sign   View Details Report               Active Problems:   GI bleed   Blood in stool   Esophageal varices without bleeding ([place])   Esophagogastric ulcer   Duodenitis   Gastrointestinal hemorrhage associated with anorectal source  Dionisio David, MD, [facility] 02/18/2016 8:57 AM

## 2016-02-19 ENCOUNTER — Inpatient Hospital Stay: Payer: Medicare Other

## 2016-02-19 LAB — CBC
HCT: 19.9 % — ABNORMAL LOW (ref 40.0–52.0)
HEMOGLOBIN: 6.5 g/dL — AB (ref 13.0–18.0)
MCH: 28.2 pg (ref 26.0–34.0)
MCHC: 32.6 g/dL (ref 32.0–36.0)
MCV: 86.6 fL (ref 80.0–100.0)
Platelets: 180 10*3/uL (ref 150–440)
RBC: 2.3 MIL/uL — ABNORMAL LOW (ref 4.40–5.90)
RDW: 22 % — AB (ref 11.5–14.5)
WBC: 30.8 10*3/uL — ABNORMAL HIGH (ref 3.8–10.6)

## 2016-02-19 LAB — GLUCOSE, CAPILLARY
GLUCOSE-CAPILLARY: 138 mg/dL — AB (ref 65–99)
GLUCOSE-CAPILLARY: 132 mg/dL — AB (ref 65–99)
Glucose-Capillary: 185 mg/dL — ABNORMAL HIGH (ref 65–99)
Glucose-Capillary: 115 mg/dL — ABNORMAL HIGH (ref 65–99)

## 2016-02-19 LAB — BASIC METABOLIC PANEL
Anion gap: 1 — ABNORMAL LOW (ref 5–15)
BUN: 17 mg/dL (ref 6–20)
CALCIUM: 7.7 mg/dL — AB (ref 8.9–10.3)
CO2: 26 mmol/L (ref 22–32)
CREATININE: 0.83 mg/dL (ref 0.61–1.24)
Chloride: 107 mmol/L (ref 101–111)
Glucose, Bld: 121 mg/dL — ABNORMAL HIGH (ref 65–99)
Potassium: 3.8 mmol/L (ref 3.5–5.1)
SODIUM: 134 mmol/L — AB (ref 135–145)

## 2016-02-19 LAB — PREPARE RBC (CROSSMATCH)

## 2016-02-19 MED ORDER — PREDNISONE 5 MG PO TABS
10.0000 mg | ORAL_TABLET | Freq: Once | ORAL | Status: AC
  Administered 2016-02-19: 10 mg via ORAL
  Filled 2016-02-19: qty 2

## 2016-02-19 MED ORDER — SODIUM CHLORIDE 0.9 % IV SOLN
Freq: Once | INTRAVENOUS | Status: AC
  Administered 2016-02-19: 11:00:00 via INTRAVENOUS

## 2016-02-19 NOTE — Progress Notes (Signed)
[facility]  Daily Progress Note   Subjective  - 2 Day Post-Op  No further bleeding Pain improved nausea resolved tolerating clear liquids  Objective Filed Vitals:   02/19/16 1113 02/19/16 1130 02/19/16 1347 02/19/16 1415  BP: 107/66 108/58 103/60 111/53  Pulse: 94 84 83 79  Temp: 98.3 F (36.8 C) 99 F (37.2 C) 98.8 F (37.1 C) 97.3 F (36.3 C)  TempSrc: Oral  Oral   Resp:  18  16  Height:      Weight:      SpO2: 93% 94%  97%    Intake/Output Summary (Last 24 hours) at 02/19/16 1440 Last data filed at 02/19/16 1400  Gross per 24 hour  Intake    970 ml  Output    400 ml  Net    570 ml    PULM  CTAB CV  RRR VASC  Access site C/D/I Abd soft  Laboratory CBC    Component Value Date/Time   WBC 30.8* 02/19/2016 0421   WBC 7.8 02/10/2015   WBC 15.2* 12/31/2012 1341   HGB 6.5* 02/19/2016 0421   HGB 14.6 12/31/2012 1341   HCT 19.9* 02/19/2016 0421   HCT 43.9 12/31/2012 1341   PLT 180 02/19/2016 0421   PLT 233 12/31/2012 1341    BMET    Component Value Date/Time   NA 134* 02/19/2016 0421   NA 141 02/10/2015   NA 139 08/25/2013 1224   K 3.8 02/19/2016 0421   K 4.4 08/25/2013 1224   CL 107 02/19/2016 0421   CL 109* 08/25/2013 1224   CO2 26 02/19/2016 0421   CO2 27 08/25/2013 1224   GLUCOSE 121* 02/19/2016 0421   GLUCOSE 125* 08/25/2013 1224   BUN 17 02/19/2016 0421   BUN 13 02/10/2015   BUN 15 08/25/2013 1224   CREATININE 0.83 02/19/2016 0421   CREATININE 1.0 02/10/2015   CREATININE 0.92 08/25/2013 1224   CALCIUM 7.7* 02/19/2016 0421   CALCIUM 9.0 08/25/2013 1224   GFRNONAA >60 02/19/2016 0421   GFRNONAA >60 08/25/2013 1224   GFRAA >60 02/19/2016 0421   GFRAA >60 08/25/2013 1224    Assessment/Planning: POD #2 s/p mesenteric embolization   Doing well  No bleeding today  Abdominal pain improving  Advance diet as tolerated, colonoscopy without evidence of ischemia a/p embolization    Katha Cabal  02/19/2016, 2:40 PM

## 2016-02-19 NOTE — Progress Notes (Signed)
Patient ID: Craig Maldonado, male   DOB: June 18, 1951, 65 y.o.   MRN: [id number] [facility] PROGRESS NOTE  Craig Maldonado [id number] DOB: 16-May-1951 DOA: 02/11/2016 PCP: Ollen Bowl, MD  HPI/Subjective: Patient passing a lot of gas. No abdominal pain. As per nurse, 2 bowel movements that were bloody.  Objective: Filed Vitals:   02/19/16 0456 02/19/16 0717  BP: 116/70 108/59  Pulse: 90 89  Temp: 98.9 F (37.2 C) 98.4 F (36.9 C)  Resp: 19 16    Filed Weights   02/11/16 0603 02/11/16 1028  Weight: 92.987 kg (205 lb) 100.88 kg (222 lb 6.4 oz)    ROS: Review of Systems  Constitutional: Negative for fever and chills.  Eyes: Negative for blurred vision.  Respiratory: Negative for cough and shortness of breath.   Cardiovascular: Negative for chest pain.  Gastrointestinal: Positive for blood in stool. Negative for nausea, vomiting, abdominal pain, diarrhea and constipation.  Genitourinary: Negative for dysuria.  Musculoskeletal: Positive for joint pain.  Neurological: Negative for dizziness and headaches.   Exam: Physical Exam  Constitutional: He is oriented to person, place, and time.  HENT:  Nose: No mucosal edema.  Mouth/Throat: No oropharyngeal exudate or posterior oropharyngeal edema.  Eyes: Conjunctivae, EOM and lids are normal. Pupils are equal, round, and reactive to light.  Conjunctiva pale  Neck: No JVD present. Carotid bruit is not present. No edema present. No thyroid mass and no thyromegaly present.  Cardiovascular: S1 normal and S2 normal.  Exam reveals no gallop.   Murmur heard.  Systolic murmur is present with a grade of 3/6  Pulses:      Dorsalis pedis pulses are 2+ on the right side, and 2+ on the left side.  Respiratory: No respiratory distress. He has no wheezes. He has no rhonchi. He has no rales.  GI: Soft. Bowel sounds are normal. There is no tenderness.  Musculoskeletal:       Right ankle: He exhibits no swelling.       Left ankle: He  exhibits no swelling.  Patient with pain in the biceps tendon insertion area and shoulder. Pain with moving shoulder.  Lymphadenopathy:    He has no cervical adenopathy.  Neurological: He is alert and oriented to person, place, and time. No cranial nerve deficit.  Skin: Skin is warm. No rash noted. Nails show no clubbing.  Psychiatric: He has a normal mood and affect.      Data Reviewed: Basic Metabolic Panel:  Recent Labs Lab 02/18/16 0519 02/19/16 0421  NA 137 134*  K 4.3 3.8  CL 108 107  CO2 23 26  GLUCOSE 146* 121*  BUN 17 17  CREATININE 0.89 0.83  CALCIUM 7.9* 7.7*   CBC:  Recent Labs Lab 02/16/16 0523  02/16/16 2002 02/17/16 0530 02/17/16 1902 02/18/16 0519 02/19/16 0421  WBC 11.4*  --   --  12.6* 18.9* 30.1* 30.8*  HGB 7.4*  < > 6.4* 8.0* 7.3* 8.2* 6.5*  HCT 21.8*  < > 18.9* 24.0* 22.5* 24.7* 19.9*  MCV 90.6  --   --  86.2 88.6 85.8 86.6  PLT 134*  --   --  155 219 185 180  < > = values in this interval not displayed.  CBG:  Recent Labs Lab 02/17/16 1618 02/17/16 2124 02/18/16 0731 02/18/16 1133 02/18/16 2057  GLUCAP 132* 135* 137* 172* 106*   Studies: Nm Gi Blood Loss  02/17/2016  CLINICAL DATA:  Clinical lower GI bleed with initial negative bleeding scan on  02/15/2016. Delayed static image yesterday demonstrated activity throughout much of the colon. Repeat tagged red blood cell scan is now performed to try to localize the bleeding source. EXAM: NUCLEAR MEDICINE GASTROINTESTINAL BLEEDING SCAN TECHNIQUE: Sequential abdominal images were obtained following intravenous administration of Tc-55m labeled red blood cells. RADIOPHARMACEUTICALS:  21 mCi Tc-93m in-vitro labeled red cells. COMPARISON:  02/15/2016 FINDINGS: Imaging was performed of the abdomen and pelvis over 1 hour. Towards the end of the imaging, positive bleeding is identified at the level of the hepatic flexure of the colon with both antegrade and retrograde transit noted in the colon.  IMPRESSION: GI bleed scan is positive for active bleeding at the level of the hepatic flexure of the colon with both antegrade and retrograde transit noted in the colon. Electronically Signed   By: Aletta Edouard M.D.   On: 02/17/2016 08:51    Scheduled Meds: . sodium chloride   Intravenous Once  . folic acid  1 mg Oral Daily  . multivitamin with minerals  1 tablet Oral Daily  . nicotine  14 mg Transdermal Daily  . pantoprazole (PROTONIX) IV  40 mg Intravenous Q12H  . predniSONE  10 mg Oral Once  . sodium chloride flush  3 mL Intravenous Q12H  . thiamine  100 mg Oral Daily    Assessment/Plan:  1. Acute hemorrhagic anemia with lower GI bleed. Patient is status post microembolization by vascular surgery. Today's hemoglobin is down to 6.5. As per nurse, patient did have 2 bloody bowel movements. I will transfuse 2 units of packed red blood cells today. Serial hemoglobins. It is not uncommon that after the microembolization we may still see blood for a few days. Stop IV fluids to prevent dilution. Change Protonix to oral. 2. Right shoulder pain and possible biceps tendinitis we'll give 1 dose of oral prednisone 10 mg 3. Hepatic cirrhosis seen on CT scan 4. Tobacco abuse on nicotine patch 5. Alcohol abuse no withdrawal seen.   Code Status:     Code Status Orders        Start     Ordered   02/11/16 0737  Full code   Continuous     02/11/16 0736    Code Status History    Date Active Date Inactive Code Status Order ID Comments User Context   This patient has a current code status but no historical code status.     Disposition Plan: Home once hemoglobin stabilizes   Consultants:  Gastroenterology    vascular surgery  Procedures:  Endoscopy   attempt at colonoscopy  Microembolization by vascular surgery  Time spent: 25 minutes  Loletha Grayer  [facility]

## 2016-02-20 LAB — CBC
HEMATOCRIT: 22.1 % — AB (ref 40.0–52.0)
Hemoglobin: 7.3 g/dL — ABNORMAL LOW (ref 13.0–18.0)
MCH: 28.6 pg (ref 26.0–34.0)
MCHC: 32.8 g/dL (ref 32.0–36.0)
MCV: 87.1 fL (ref 80.0–100.0)
Platelets: 153 10*3/uL (ref 150–440)
RBC: 2.54 MIL/uL — ABNORMAL LOW (ref 4.40–5.90)
RDW: 20.7 % — AB (ref 11.5–14.5)
WBC: 22.2 10*3/uL — ABNORMAL HIGH (ref 3.8–10.6)

## 2016-02-20 LAB — GLUCOSE, CAPILLARY
GLUCOSE-CAPILLARY: 147 mg/dL — AB (ref 65–99)
GLUCOSE-CAPILLARY: 121 mg/dL — AB (ref 65–99)
Glucose-Capillary: 97 mg/dL (ref 65–99)
Glucose-Capillary: 146 mg/dL — ABNORMAL HIGH (ref 65–99)

## 2016-02-20 LAB — PREPARE RBC (CROSSMATCH)

## 2016-02-20 MED ORDER — SODIUM CHLORIDE 0.9 % IV SOLN
Freq: Once | INTRAVENOUS | Status: AC
  Administered 2016-02-20: 12:00:00 via INTRAVENOUS

## 2016-02-20 MED ORDER — FUROSEMIDE 10 MG/ML IJ SOLN
40.0000 mg | Freq: Once | INTRAMUSCULAR | Status: AC
  Administered 2016-02-20: 40 mg via INTRAVENOUS
  Filled 2016-02-20: qty 4

## 2016-02-20 NOTE — Progress Notes (Signed)
PT Cancellation Note  Patient Details Name: Craig Maldonado MRN: [id number] DOB: December 31, 1950   Cancelled Treatment:    Reason Eval/Treat Not Completed: Other (comment) (getting blood) Pt is getting blood transfusion this afternoon and reports that he is feeling to tired and sore to do anything anyway.  Requests we hold PT until tomorrow.  Craig Maldonado, PT, DPT [phone]  Craig Maldonado 02/20/2016, 2:54 PM

## 2016-02-20 NOTE — Progress Notes (Signed)
Patient ID: Craig Maldonado, male   DOB: 1951/10/13, 65 y.o.   MRN: [id number]  [facility] PROGRESS NOTE  Craig Maldonado [id number] DOB: 06/25/1951 DOA: 02/11/2016 PCP: Ollen Bowl, MD  HPI/Subjective: Patient still complaining of a lot of right shoulder pain. X-ray was negative on presentation. When he lifted his right shoulder there was a pop. Passing a lot of gas but no bowel movement since I saw him yesterday.  Objective: Filed Vitals:   02/20/16 0359 02/20/16 0713  BP: 126/57 119/65  Pulse: 74 81  Temp: 98.3 F (36.8 C) 98.4 F (36.9 C)  Resp: 18 16    Filed Weights   02/11/16 0603 02/11/16 1028  Weight: 92.987 kg (205 lb) 100.88 kg (222 lb 6.4 oz)    ROS: Review of Systems  Constitutional: Negative for fever and chills.  Eyes: Negative for blurred vision.  Respiratory: Negative for cough and shortness of breath.   Cardiovascular: Negative for chest pain.  Gastrointestinal: Negative for nausea, vomiting, abdominal pain, diarrhea and constipation.  Genitourinary: Negative for dysuria.  Musculoskeletal: Positive for joint pain.  Neurological: Negative for dizziness and headaches.   Exam: Physical Exam  Constitutional: He is oriented to person, place, and time.  HENT:  Nose: No mucosal edema.  Mouth/Throat: No oropharyngeal exudate or posterior oropharyngeal edema.  Eyes: Conjunctivae, EOM and lids are normal. Pupils are equal, round, and reactive to light.  Conjunctiva pale  Neck: No JVD present. Carotid bruit is not present. No edema present. No thyroid mass and no thyromegaly present.  Cardiovascular: S1 normal and S2 normal.  Exam reveals no gallop.   Murmur heard.  Systolic murmur is present with a grade of 3/6  Pulses:      Dorsalis pedis pulses are 2+ on the right side, and 2+ on the left side.  Respiratory: No respiratory distress. He has no wheezes. He has no rhonchi. He has no rales.  GI: Soft. Bowel sounds are normal. There is no tenderness.   Musculoskeletal:       Right ankle: He exhibits no swelling.       Left ankle: He exhibits no swelling.  Patient with pain in the biceps tendon insertion area and shoulder. Pain and a pop when he moved his right shoulder on his own.  Lymphadenopathy:    He has no cervical adenopathy.  Neurological: He is alert and oriented to person, place, and time. No cranial nerve deficit.  Skin: Skin is warm. No rash noted. Nails show no clubbing.  Psychiatric: He has a normal mood and affect.      Data Reviewed: Basic Metabolic Panel:  Recent Labs Lab 02/18/16 0519 02/19/16 0421  NA 137 134*  K 4.3 3.8  CL 108 107  CO2 23 26  GLUCOSE 146* 121*  BUN 17 17  CREATININE 0.89 0.83  CALCIUM 7.9* 7.7*   CBC:  Recent Labs Lab 02/17/16 0530 02/17/16 1902 02/18/16 0519 02/19/16 0421 02/20/16 0335  WBC 12.6* 18.9* 30.1* 30.8* 22.2*  HGB 8.0* 7.3* 8.2* 6.5* 7.3*  HCT 24.0* 22.5* 24.7* 19.9* 22.1*  MCV 86.2 88.6 85.8 86.6 87.1  PLT 155 219 185 180 153    CBG:  Recent Labs Lab 02/19/16 0718 02/19/16 1125 02/19/16 1606 02/19/16 2059 02/20/16 0715  GLUCAP 138* 185* 132* 115* 97   Studies: Dg Chest 2 View  02/19/2016  CLINICAL DATA:  Leukocytosis. GI bleeding. Underwent embolization 2 days ago. EXAM: CHEST  2 VIEW COMPARISON:  12/31/2012 FINDINGS: The heart size and  mediastinal contours are within normal limits. Both lungs are clear. No evidence of pneumothorax or pleural effusion. Thoracic spine degenerative changes again noted. IMPRESSION: No active cardiopulmonary disease. Electronically Signed   By: Earle Gell M.D.   On: 02/19/2016 09:12    Scheduled Meds: . sodium chloride   Intravenous Once  . folic acid  1 mg Oral Daily  . furosemide  40 mg Intravenous Once  . multivitamin with minerals  1 tablet Oral Daily  . nicotine  14 mg Transdermal Daily  . pantoprazole (PROTONIX) IV  40 mg Intravenous Q12H  . sodium chloride flush  3 mL Intravenous Q12H  . thiamine  100 mg Oral  Daily    Assessment/Plan:  1. Acute hemorrhagic anemia with lower GI bleed. Patient is status post microembolization by vascular surgery. Today's hemoglobin is 7.3. This is a poor response to 2 units of packed red blood cells given yesterday. Give another unit of blood today. Advance diet to solid food. 2. Right shoulder pain and possible biceps tendinitis. X-ray negative on presentation we'll get an orthopedic consultation 3. Hepatic cirrhosis seen on CT scan 4. Tobacco abuse on nicotine patch 5. Alcohol abuse no withdrawal seen.  6. Weakness we'll get physical therapy evaluation  Code Status:     Code Status Orders        Start     Ordered   02/11/16 0737  Full code   Continuous     02/11/16 0736    Code Status History    Date Active Date Inactive Code Status Order ID Comments User Context   This patient has a current code status but no historical code status.     Disposition Plan: Hopefully home in the next day or so  Consultants:  Gastroenterology   Vascular surgery  Procedures:  Endoscopy   Attempt at colonoscopy  Microembolization by vascular surgery  Time spent: 20 minutes  Loletha Grayer  [facility]

## 2016-02-20 NOTE — Care Management Important Message (Signed)
Important Message  Patient Details  Name: Craig Maldonado MRN: [id number] Date of Birth: 1951-04-16   Medicare Important Message Given:  Yes    Kalai Baca A, RN 02/20/2016, 1:47 PM

## 2016-02-20 NOTE — Consult Note (Signed)
Patient is a 65 year old left-hand dominant male who suffered a fall secondary to anemia passing out approximately 10 days ago. He injured his right shoulder at that time and had more life-threatening problem of the GI bleed which is being addressed during this hospitalization. He continues to have shoulder pain. He has had x-rays that show no significant abnormality except for some mild before meals degenerative change. He reports significant pain in the shoulder and popping sensation  On examination he has moderate weakness to external rotation internal rotation strength intact abduction week passive range of motion is nearly full but he hasn't he lacks internal rotation with 90 of shoulder flexion is at 0 and external rotation is 90 with active range of motion is flexion is only about 40 abduction same internal/external rotation with his arm at his side to internally rotate to get his hand stomach and external rotation to about 40 is some supraspinatus atrophy to palpation is tender over the before meals joint as well as bicipital groove  Impression is probable rotator cuff tear  Recommendations for MRI either during hospitalization or as an outpatient. I did explain to him that his GI bleed is much more important and he can have this followed up as an outpatient when he is medically stabilized and that nothing can be done for his shoulder until that has been accomplished

## 2016-02-21 ENCOUNTER — Ambulatory Visit: Payer: Medicare Other | Admitting: Gastroenterology

## 2016-02-21 ENCOUNTER — Encounter: Payer: Self-pay | Admitting: Vascular Surgery

## 2016-02-21 LAB — TYPE AND SCREEN
ABO/RH(D): O POS
Antibody Screen: NEGATIVE
UNIT DIVISION: 0
Unit division: 0
Unit division: 0

## 2016-02-21 LAB — CBC
HCT: 22.3 % — ABNORMAL LOW (ref 40.0–52.0)
HEMOGLOBIN: 7.4 g/dL — AB (ref 13.0–18.0)
MCH: 28.2 pg (ref 26.0–34.0)
MCHC: 32.9 g/dL (ref 32.0–36.0)
MCV: 85.6 fL (ref 80.0–100.0)
Platelets: 181 10*3/uL (ref 150–440)
RBC: 2.61 MIL/uL — AB (ref 4.40–5.90)
RDW: 19.1 % — ABNORMAL HIGH (ref 11.5–14.5)
WBC: 16.6 10*3/uL — ABNORMAL HIGH (ref 3.8–10.6)

## 2016-02-21 LAB — GLUCOSE, CAPILLARY
GLUCOSE-CAPILLARY: 96 mg/dL (ref 65–99)
GLUCOSE-CAPILLARY: 153 mg/dL — AB (ref 65–99)
GLUCOSE-CAPILLARY: 113 mg/dL — AB (ref 65–99)
GLUCOSE-CAPILLARY: 123 mg/dL — AB (ref 65–99)

## 2016-02-21 MED ORDER — POLYETHYLENE GLYCOL 3350 17 G PO PACK
17.0000 g | PACK | Freq: Every day | ORAL | Status: DC
  Administered 2016-02-21 – 2016-02-22 (×2): 17 g via ORAL
  Filled 2016-02-21 (×2): qty 1

## 2016-02-21 MED ORDER — PANTOPRAZOLE SODIUM 40 MG PO TBEC
40.0000 mg | DELAYED_RELEASE_TABLET | Freq: Two times a day (BID) | ORAL | Status: DC
  Administered 2016-02-21 – 2016-02-22 (×2): 40 mg via ORAL
  Filled 2016-02-21 (×2): qty 1

## 2016-02-21 MED ORDER — OXYCODONE HCL 5 MG PO TABS
5.0000 mg | ORAL_TABLET | ORAL | Status: DC | PRN
  Administered 2016-02-21 – 2016-02-22 (×6): 5 mg via ORAL
  Filled 2016-02-21 (×6): qty 1

## 2016-02-21 NOTE — Progress Notes (Signed)
Pt wantst to have MRI done as out patient, MD [place] notified. Order cancelled.

## 2016-02-21 NOTE — Progress Notes (Signed)
Orders received at the bedside by MD [place] to change IV Protonix to PO Protonix.

## 2016-02-21 NOTE — Progress Notes (Signed)
SUBJECTIVE: Feeling much better   Filed Vitals:   02/20/16 1518 02/20/16 1532 02/20/16 1942 02/21/16 0722  BP: 103/62 111/64 110/60 112/61  Pulse: 86 88 90 79  Temp: 98.6 F (37 C) 99 F (37.2 C) 98.2 F (36.8 C) 98.3 F (36.8 C)  TempSrc: Oral Oral Oral Oral  Resp: 16  18 18   Height:      Weight:      SpO2: 96% 97% 99% 96%    Intake/Output Summary (Last 24 hours) at 02/21/16 1006 Last data filed at 02/21/16 0315  Gross per 24 hour  Intake   1060 ml  Output   1150 ml  Net    -90 ml    LABS: Basic Metabolic Panel:  Recent Labs  02/19/16 0421  NA 134*  K 3.8  CL 107  CO2 26  GLUCOSE 121*  BUN 17  CREATININE 0.83  CALCIUM 7.7*   Liver Function Tests: No results for input(s): AST, ALT, ALKPHOS, BILITOT, PROT, ALBUMIN in the last 72 hours. No results for input(s): LIPASE, AMYLASE in the last 72 hours. CBC:  Recent Labs  02/20/16 0335 02/21/16 0406  WBC 22.2* 16.6*  HGB 7.3* 7.4*  HCT 22.1* 22.3*  MCV 87.1 85.6  PLT 153 181   Cardiac Enzymes: No results for input(s): CKTOTAL, CKMB, CKMBINDEX, TROPONINI in the last 72 hours. BNP: Invalid input(s): POCBNP D-Dimer: No results for input(s): DDIMER in the last 72 hours. Hemoglobin A1C: No results for input(s): HGBA1C in the last 72 hours. Fasting Lipid Panel: No results for input(s): CHOL, HDL, LDLCALC, TRIG, CHOLHDL, LDLDIRECT in the last 72 hours. Thyroid Function Tests: No results for input(s): TSH, T4TOTAL, T3FREE, THYROIDAB in the last 72 hours.  Invalid input(s): FREET3 Anemia Panel: No results for input(s): VITAMINB12, FOLATE, FERRITIN, TIBC, IRON, RETICCTPCT in the last 72 hours.   PHYSICAL EXAM General: Well developed, well nourished, in no acute distress HEENT:  Normocephalic and atramatic Neck:  No JVD.  Lungs: Clear bilaterally to auscultation and percussion. Heart: HRRR . Normal S1 and S2 without gallops or murmurs.  Abdomen: Bowel sounds are positive, abdomen soft and non-tender   Msk:  Back normal, normal gait. Normal strength and tone for age. Extremities: No clubbing, cyanosis or edema.   Neuro: Alert and oriented X 3. Psych:  Good affect, responds appropriately  TELEMETRY: Not on monitor  ASSESSMENT AND PLAN: AV block/Bradycardia. Upon d/c home f/u office Thursday 10 am  Active Problems:   GI bleed   Blood in stool   Esophageal varices without bleeding ([place])   Esophagogastric ulcer   Duodenitis   Gastrointestinal hemorrhage associated with anorectal source    Dionisio David, MD, [facility] 02/21/2016 10:06 AM

## 2016-02-21 NOTE — Evaluation (Signed)
Physical Therapy Evaluation Patient Details Name: Craig Maldonado MRN: [id number] DOB: 03/28/1951 Today's Date: 02/21/2016   History of Present Illness  Craig Maldonado is a 65 y.o. male presents after a syncopal episode and noticing blood in the toilet and all over the floor and on his leg. He states that he woke up this morning and he passed gas. He went to the toilet and sat on the toilet and he had a bowel movements. He then saw stars got dizzy and passed out. When he woke up he noticed blood on his leg and sock and on the floor and in the toilet. Bright red blood per rectum. In the ER his hemoglobin was found to be 10.8. Last year it was 14.4. Pt was admitted for acute hemorrhagic anemia with lower GI bleed s/p microembolization surgery. Pt reports that current syncopal episode is the only fall he has had in the last 12 months. He is otherwise independent with all ADLs/IADLs. Pt drives and lives alone since his wife's passing March 12th 2017.   Clinical Impression  Pt demonstrates baseline mobility at this time. He denies DOE with ambulation and vitals remain WAL throughout. Pt presents with bilateral loss of dorsiflexion strength which is chronic prior to admission. This manifests in steppage gait with hard strike at initial contact. He does have some higher balance deficits and pt encouraged to utilize walking stick or cane at discharge. No further PT needs identified at this time. Order will be completed. Please enter new order if status changes or needs arise.    Follow Up Recommendations No PT follow up    Equipment Recommendations  None recommended by PT    Recommendations for Other Services       Precautions / Restrictions Precautions Precautions: None Restrictions Weight Bearing Restrictions: No      Mobility  Bed Mobility Overal bed mobility: Independent             General bed mobility comments: HOB flat, no bed rails. Good speed and sequencing  Transfers Overall transfer  level: Independent   Transfers: Sit to/from Stand Sit to Stand: Independent         General transfer comment: Good speed, sequencing, and balance noted during transfer  Ambulation/Gait Ambulation/Gait assistance: Supervision Ambulation Distance (Feet): 220 Feet Assistive device: None Gait Pattern/deviations: Steppage;Decreased dorsiflexion - right;Decreased dorsiflexion - left Gait velocity: Decreased but functional   General Gait Details: Pt ambulates with good speed. Fair stability noted with head turns left and right. Pt ambulates with steppage gait with decreased toe clearance and absent dorsiflexion bilaterally. Lands on midfoot with heavy strike. Gait speed changes performed with patient. Vitals monitored throughotu and HR increases to 108 bpm. SaO2 remains >95% on room air.  Stairs            Wheelchair Mobility    Modified Rankin (Stroke Patients Only)       Balance Overall balance assessment: Needs assistance Sitting-balance support: No upper extremity supported Sitting balance-Leahy Scale: Good     Standing balance support: No upper extremity supported Standing balance-Leahy Scale: Fair Standing balance comment: Pt able to maintain wide stance without UE support. Unable to achieve narrow stance due to poor balance without UE support. Once in feet together stance pt able to maintain.                             Pertinent Vitals/Pain Pain Assessment: No/denies pain    Home Living  Family/patient expects to be discharged to:: Private residence Living Arrangements: Alone   Type of Home: Apartment Home Access: Level entry     Home Layout: Multi-level;Able to live on main level with bedroom/bathroom Home Equipment: Gilford Rile - 2 wheels;Bedside commode;Shower seat      Prior Function Level of Independence: Independent         Comments: Pt is independent for community ambulation without assistive device. Drives     Hand Dominance    Dominant Hand: Left    Extremity/Trunk Assessment   Upper Extremity Assessment: Overall WFL for tasks assessed           Lower Extremity Assessment: Overall WFL for tasks assessed;RLE deficits/detail;LLE deficits/detail RLE Deficits / Details: Pt demonstrates absent active dorsiflexion bilaterally with RLE plantarflexor contractures. Pt able to achieve PROM DF on LLE to neutral       Communication   Communication: No difficulties  Cognition Arousal/Alertness: Awake/alert Behavior During Therapy: WFL for tasks assessed/performed Overall Cognitive Status: Within Functional Limits for tasks assessed                      General Comments      Exercises        Assessment/Plan    PT Assessment Patent does not need any further PT services  PT Diagnosis Abnormality of gait   PT Problem List    PT Treatment Interventions     PT Goals (Current goals can be found in the Care Plan section) Acute Rehab PT Goals Patient Stated Goal: "I want to get out of here" PT Goal Formulation: With patient Potential to Achieve Goals: Good    Frequency     Barriers to discharge        Co-evaluation               End of Session Equipment Utilized During Treatment: Gait belt Activity Tolerance: Patient tolerated treatment well Patient left: in bed;with call bell/phone within reach;with bed alarm set Nurse Communication: Mobility status         Time: [id number] PT Time Calculation (min) (ACUTE ONLY): 15 min   Charges:   PT Evaluation $PT Eval Low Complexity: 1 Procedure     PT G Codes:       Lyndel Safe Huprich PT, DPT   Huprich,Jason 02/21/2016, 10:48 AM

## 2016-02-21 NOTE — Care Management Note (Addendum)
Case Management Note  Patient Details  Name: Craig Maldonado MRN: [id number] Date of Birth: 10-May-1951  Subjective/Objective:            Hgb=7.4 today. Received 2 units PRBCs over the weekend.  Anticipate discharge home with home health.        Action/Plan:   Expected Discharge Date:  02/15/16               Expected Discharge Plan:     In-House Referral:     Discharge planning Services     Post Acute Care Choice:    Choice offered to:     DME Arranged:    DME Agency:     HH Arranged:    HH Agency:     Status of Service:     Medicare Important Message Given:  Yes Date Medicare IM Given:    Medicare IM give by:    Date Additional Medicare IM Given:    Additional Medicare Important Message give by:     If discussed at [place] of Stay Meetings, dates discussed:    Additional Comments:  Marcheta Horsey A, RN 02/21/2016, 1:15 PM

## 2016-02-21 NOTE — Progress Notes (Signed)
Patient ID: Craig Maldonado, male   DOB: [birth year], 65 y.o.   MRN: [id number]  [facility] PROGRESS NOTE  Craig Maldonado [id number] DOB: 1951/11/17 DOA: 02/11/2016 PCP: Ollen Bowl, MD  HPI/Subjective: Patient complaining of a lot of right shoulder Pain. It pops when he moves it. Patient refused MRI today. No further bleeding.  Objective: Filed Vitals:   02/20/16 1942 02/21/16 0722  BP: 110/60 112/61  Pulse: 90 79  Temp: 98.2 F (36.8 C) 98.3 F (36.8 C)  Resp: 18 18    Filed Weights   02/11/16 0603 02/11/16 1028  Weight: 92.987 kg (205 lb) 100.88 kg (222 lb 6.4 oz)    ROS: Review of Systems  Constitutional: Negative for fever and chills.  Eyes: Negative for blurred vision.  Respiratory: Negative for cough and shortness of breath.   Cardiovascular: Negative for chest pain.  Gastrointestinal: Negative for nausea, vomiting, abdominal pain, diarrhea and constipation.  Genitourinary: Negative for dysuria.  Musculoskeletal: Positive for joint pain.  Neurological: Negative for dizziness and headaches.   Exam: Physical Exam  Constitutional: He is oriented to person, place, and time.  HENT:  Nose: No mucosal edema.  Mouth/Throat: No oropharyngeal exudate or posterior oropharyngeal edema.  Eyes: Conjunctivae, EOM and lids are normal. Pupils are equal, round, and reactive to light.  Conjunctiva pale  Neck: No JVD present. Carotid bruit is not present. No edema present. No thyroid mass and no thyromegaly present.  Cardiovascular: S1 normal and S2 normal.  Exam reveals no gallop.   Murmur heard.  Systolic murmur is present with a grade of 3/6  Pulses:      Dorsalis pedis pulses are 2+ on the right side, and 2+ on the left side.  Respiratory: No respiratory distress. He has no wheezes. He has no rhonchi. He has no rales.  GI: Soft. Bowel sounds are normal. There is no tenderness.  Musculoskeletal:       Right ankle: He exhibits no swelling.       Left ankle: He  exhibits no swelling.  Patient with pain in the biceps tendon insertion area and shoulder. Pain and a pop when he moved his right shoulder on his own.  Lymphadenopathy:    He has no cervical adenopathy.  Neurological: He is alert and oriented to person, place, and time. No cranial nerve deficit.  Skin: Skin is warm. No rash noted. Nails show no clubbing.  Psychiatric: He has a normal mood and affect.      Data Reviewed: Basic Metabolic Panel:  Recent Labs Lab 02/18/16 0519 02/19/16 0421  NA 137 134*  K 4.3 3.8  CL 108 107  CO2 23 26  GLUCOSE 146* 121*  BUN 17 17  CREATININE 0.89 0.83  CALCIUM 7.9* 7.7*   CBC:  Recent Labs Lab 02/17/16 1902 02/18/16 0519 02/19/16 0421 02/20/16 0335 02/21/16 0406  WBC 18.9* 30.1* 30.8* 22.2* 16.6*  HGB 7.3* 8.2* 6.5* 7.3* 7.4*  HCT 22.5* 24.7* 19.9* 22.1* 22.3*  MCV 88.6 85.8 86.6 87.1 85.6  PLT 219 185 180 153 181    CBG:  Recent Labs Lab 02/20/16 1117 02/20/16 1604 02/20/16 2124 02/21/16 0721 02/21/16 1113  GLUCAP 147* 146* 121* 96 153*    Scheduled Meds: . folic acid  1 mg Oral Daily  . multivitamin with minerals  1 tablet Oral Daily  . nicotine  14 mg Transdermal Daily  . pantoprazole  40 mg Oral Q12H  . polyethylene glycol  17 g Oral Daily  . sodium  chloride flush  3 mL Intravenous Q12H  . thiamine  100 mg Oral Daily    Assessment/Plan:  1. Acute hemorrhagic anemia with lower GI bleed. Patient is status post microembolization by vascular surgery. Today's hemoglobin is 7.4. This is a poor response to 3 units of packed red blood cells given over the past 2 days. Monitor hemoglobin another day. Potential discharge tomorrow 2. Right shoulder pain and likely torn rotator cuff. Patient refused MRI follow-up with orthopedics as outpatient 3. Hepatic cirrhosis seen on CT scan 4. Tobacco abuse on nicotine patch 5. Alcohol abuse no withdrawal seen.  6. Weakness we'll get physical therapy evaluation  Code Status:      Code Status Orders        Start     Ordered   02/11/16 0737  Full code   Continuous     02/11/16 0736    Code Status History    Date Active Date Inactive Code Status Order ID Comments User Context   This patient has a current code status but no historical code status.     Disposition Plan: Hopefully home in the next day or so  Consultants:  Gastroenterology   Vascular surgery  Orthopedic surgery  Procedures:  Endoscopy   Attempt at colonoscopy  Microembolization by vascular surgery  Time spent: 20 minutes  Loletha Grayer  [facility]

## 2016-02-22 LAB — CBC
HEMATOCRIT: 22.1 % — AB (ref 40.0–52.0)
Hemoglobin: 7.4 g/dL — ABNORMAL LOW (ref 13.0–18.0)
MCH: 28.5 pg (ref 26.0–34.0)
MCHC: 33.3 g/dL (ref 32.0–36.0)
MCV: 85.8 fL (ref 80.0–100.0)
Platelets: 185 10*3/uL (ref 150–440)
RBC: 2.58 MIL/uL — ABNORMAL LOW (ref 4.40–5.90)
RDW: 18.8 % — AB (ref 11.5–14.5)
WBC: 10.7 10*3/uL — ABNORMAL HIGH (ref 3.8–10.6)

## 2016-02-22 LAB — GLUCOSE, CAPILLARY
GLUCOSE-CAPILLARY: 109 mg/dL — AB (ref 65–99)
GLUCOSE-CAPILLARY: 130 mg/dL — AB (ref 65–99)
Glucose-Capillary: 103 mg/dL — ABNORMAL HIGH (ref 65–99)

## 2016-02-22 MED ORDER — NICOTINE 14 MG/24HR TD PT24: 14.0000 mg | MEDICATED_PATCH | Freq: Every day | TRANSDERMAL | Status: DC

## 2016-02-22 MED ORDER — FERROUS SULFATE 325 (65 FE) MG PO TABS: 325.0000 mg | ORAL_TABLET | Freq: Every day | ORAL | Status: DC

## 2016-02-22 MED ORDER — OXYCODONE HCL 5 MG PO TABS: 5.0000 mg | ORAL_TABLET | Freq: Four times a day (QID) | ORAL | Status: DC | PRN

## 2016-02-22 MED ORDER — THIAMINE HCL 100 MG PO TABS: 100.0000 mg | ORAL_TABLET | Freq: Every day | ORAL | Status: DC

## 2016-02-22 MED ORDER — SODIUM CHLORIDE 0.9 % IV SOLN
400.0000 mg | Freq: Once | INTRAVENOUS | Status: AC
  Administered 2016-02-22: 400 mg via INTRAVENOUS
  Filled 2016-02-22: qty 20

## 2016-02-22 MED ORDER — POLYETHYLENE GLYCOL 3350 17 G PO PACK: 17.0000 g | PACK | Freq: Every day | ORAL | Status: DC | PRN

## 2016-02-22 MED ORDER — FERROUS SULFATE 325 (65 FE) MG PO TABS
325.0000 mg | ORAL_TABLET | Freq: Every day | ORAL | Status: DC
  Administered 2016-02-22: 325 mg via ORAL
  Filled 2016-02-22: qty 1

## 2016-02-22 NOTE — Progress Notes (Signed)
While talking with Craig Maldonado this morning, he has expressed grief over the recent loss of his wife.  His wife was cared for by [facility].  RN asked if he were aware of bereavement services offered by Hospice.  He stated he was not, but is open to being contacted.  The general time frame was initial bereavement contact has occurred while he has been here in the hospital.  RN asked permission to contact Hospice counselor to let them know that he has been in the hospital during this period of time.  He was agreeable.  MJ Tucci, bereavement counselor and chaplain was notified by telephone call.  She states that Craig Maldonado has been assigned to this case and will be notified of hospitalization and probably discharge today.  Deri Fuelling, RN

## 2016-02-22 NOTE — Discharge Summary (Signed)
Craig Maldonado    MR#:  [id number]  DATE OF BIRTH:  02-Aug-1951  DATE OF ADMISSION:  02/11/2016 ADMITTING PHYSICIAN: Loletha Grayer, MD  DATE OF DISCHARGE: 02/22/2016  PRIMARY CARE PHYSICIAN: Ollen Bowl, MD    ADMISSION DIAGNOSIS:  Gastrointestinal hemorrhage associated with anorectal source [K62.5]  DISCHARGE DIAGNOSIS:  Active Problems:   GI bleed   Blood in stool   Esophageal varices without bleeding (HCC)   Esophagogastric ulcer   Duodenitis   Gastrointestinal hemorrhage associated with anorectal source   SECONDARY DIAGNOSIS:  History reviewed. No pertinent past medical history.  HOSPITAL COURSE:   1. Acute hemorrhagic anemia, lower GI bleed. The patient had an upper endoscopy which ruled out upper GI bleed. There was an attempt at a colonoscopy but too much blood. First bleeding scan negative on 02/15/2016. Second bleeding scan positive on 02/17/2016. Patient had an embolization by Dr. do vascular surgery on 02/17/2016. We will watch in the patient's hemoglobin closely up until 02/22/2016. Patient received a total of 8 units of packed red blood cells during the entire hospital course. Hemoglobin 7.4 upon discharge. Stable but low. Patient was given IV Venofer 400 mg IV 1.  2. Right shoulder pain and likely torn rotator cuff. Patient will follow-up with Dr. Rudene Christians  as outpatient to obtain MRI as outpatient 3. Hepatic cirrhosis seen on CT scan 4. Alcohol abuse with no withdrawal seen during the hospital course   DISCHARGE CONDITIONS:   Satisfactory  CONSULTS OBTAINED:  Treatment Team:  Lucilla Lame, MD Dionisio David, MD Algernon Huxley, MD Dustin Flock, MD Hessie Knows, MD  DRUG ALLERGIES:   Allergies  Allergen Reactions  . Codeine Nausea And Vomiting    DISCHARGE MEDICATIONS:   Current Discharge Medication List    START taking these medications   Details  acetaminophen (TYLENOL) 325 MG  tablet Take 2 tablets (650 mg total) by mouth every 6 (six) hours as needed for mild pain (or Fever >/= 101).    docusate sodium (COLACE) 100 MG capsule Take 1 capsule (100 mg total) by mouth 2 (two) times daily. Qty: 10 capsule, Refills: 0    ferrous sulfate 325 (65 FE) MG tablet Take 1 tablet (325 mg total) by mouth daily with breakfast. Qty: 30 tablet, Refills: 0    folic acid (FOLVITE) 1 MG tablet Take 1 tablet (1 mg total) by mouth daily.    Multiple Vitamin (MULTIVITAMIN WITH MINERALS) TABS tablet Take 1 tablet by mouth daily.    nicotine (NICODERM CQ - DOSED IN MG/24 HOURS) 14 mg/24hr patch Place 1 patch (14 mg total) onto the skin daily. Qty: 28 patch, Refills: 0    oxyCODONE (OXY IR/ROXICODONE) 5 MG immediate release tablet Take 1 tablet (5 mg total) by mouth every 6 (six) hours as needed for moderate pain. Qty: 30 tablet, Refills: 0    polyethylene glycol (MIRALAX / GLYCOLAX) packet Take 17 g by mouth daily as needed for moderate constipation. Qty: 30 each, Refills: 0    thiamine 100 MG tablet Take 1 tablet (100 mg total) by mouth daily. Qty: 30 tablet, Refills: 0      CONTINUE these medications which have NOT CHANGED   Details  furosemide (LASIX) 40 MG tablet TAKE ONE TABLET BY MOUTH ONCE DAILY---PATIENT NEEDS AN APPOINTMENT FOR OFFICE FOLLOW UP TO GET ANY MORE MEDICATION Qty: 10 tablet, Refills: 0    spironolactone (ALDACTONE) 25 MG tablet TAKE ONE TABLET  BY MOUTH ONCE DAILY Qty: 10 tablet, Refills: 0         DISCHARGE INSTRUCTIONS:    Follow-up PMD one week Follow-up Dr. Allen Norris as outpatient  If you experience worsening of your admission symptoms, develop shortness of breath, life threatening emergency, suicidal or homicidal thoughts you must seek medical attention immediately by calling 911 or calling your MD immediately  if symptoms less severe.  You Must read complete instructions/literature along with all the possible adverse reactions/side effects for all  the Medicines you take and that have been prescribed to you. Take any new Medicines after you have completely understood and accept all the possible adverse reactions/side effects.   Please note  You were cared for by a hospitalist during your hospital stay. If you have any questions about your discharge medications or the care you received while you were in the hospital after you are discharged, you can call the unit and asked to speak with the hospitalist on call if the hospitalist that took care of you is not available. Once you are discharged, your primary care physician will handle any further medical issues. Please note that NO REFILLS for any discharge medications will be authorized once you are discharged, as it is imperative that you return to your primary care physician (or establish a relationship with a primary care physician if you do not have one) for your aftercare needs so that they can reassess your need for medications and monitor your lab values.    Today   CHIEF COMPLAINT:   Chief Complaint  Patient presents with  . GI Bleeding    HISTORY OF PRESENT ILLNESS:  Craig Maldonado  is a 65 y.o. male presents with lower GI bleeding   VITAL SIGNS:  Blood pressure 112/59, pulse 81, temperature 98.4 F (36.9 C), temperature source Oral, resp. rate 18, height 5\' 11"  (1.803 m), weight 100.88 kg (222 lb 6.4 oz), SpO2 98 %.    PHYSICAL EXAMINATION:  GENERAL:  65 y.o.-year-old patient lying in the bed with no acute distress.  EYES: Pupils equal, round, reactive to light and accommodation. No scleral icterus. Extraocular muscles intact.  HEENT: Head atraumatic, normocephalic. Oropharynx and nasopharynx clear.  NECK:  Supple, no jugular venous distention. No thyroid enlargement, no tenderness.  LUNGS: Normal breath sounds bilaterally, no wheezing, rales,rhonchi or crepitation. No use of accessory muscles of respiration.  CARDIOVASCULAR: S1, S2 normal. No murmurs, rubs, or gallops.   ABDOMEN: Soft, non-tender, non-distended. Bowel sounds present. No organomegaly or mass.  EXTREMITIES: No pedal edema, cyanosis, or clubbing.  NEUROLOGIC: Cranial nerves II through XII are intact. Muscle strength 5/5 in all extremities. Sensation intact. Gait not checked.  PSYCHIATRIC: The patient is alert and oriented x 3.  SKIN: No obvious rash, lesion, or ulcer.   DATA REVIEW:   CBC  Recent Labs Lab 02/22/16 0535  WBC 10.7*  HGB 7.4*  HCT 22.1*  PLT 185    Chemistries   Recent Labs Lab 02/19/16 0421  NA 134*  K 3.8  CL 107  CO2 26  GLUCOSE 121*  BUN 17  CREATININE 0.83  CALCIUM 7.7*    Cardiac Enzymes No results for input(s): TROPONINI in the last 168 hours.  Microbiology Results  Results for orders placed or performed during the hospital encounter of 02/11/16  CULTURE, BLOOD (ROUTINE X 2) w Reflex to PCR ID Panel     Status: None (Preliminary result)   Collection Time: 02/18/16  2:30 PM  Result Value Ref Range  Status   Specimen Description BLOOD LEFT ANTECUBITAL  Final   Special Requests BOTTLES DRAWN AEROBIC AND ANAEROBIC  [place]  Final   Culture NO GROWTH 4 DAYS  Final   Report Status PENDING  Incomplete  CULTURE, BLOOD (ROUTINE X 2) w Reflex to PCR ID Panel     Status: None (Preliminary result)   Collection Time: 02/18/16  2:30 PM  Result Value Ref Range Status   Specimen Description BLOOD RIGHT ANTECUBITAL  Final   Special Requests BOTTLES DRAWN AEROBIC AND ANAEROBIC  8CC  Final   Culture NO GROWTH 4 DAYS  Final   Report Status PENDING  Incomplete    Management plans discussed with the patient, family and they are in agreement.  CODE STATUS:     Code Status Orders        Start     Ordered   02/11/16 0737  Full code   Continuous     02/11/16 0736    Code Status History    Date Active Date Inactive Code Status Order ID Comments User Context   This patient has a current code status but no historical code status.      TOTAL TIME TAKING CARE  OF THIS PATIENT: 35 minutes.    Loletha Grayer M.D on 02/22/2016 at 4:53 PM  Between 7am to 6pm - Pager - [phone]  After 6pm go to [email or web address] - password [email or web address]  [facility] Office  [phone]  CC: Primary care physician; Ollen Bowl, MD

## 2016-02-22 NOTE — Progress Notes (Signed)
  SUBJECTIVE: Pt is alert and talkative. He denies having weakness or dizziness when up in room. He says that he may be discharged today.   Filed Vitals:   02/21/16 1516 02/21/16 1925 02/22/16 0514 02/22/16 0741  BP: 114/63 108/59 129/58 112/59  Pulse: 78 73 80 81  Temp: 98.6 F (37 C) 98.9 F (37.2 C) 99.5 F (37.5 C) 98.4 F (36.9 C)  TempSrc: Oral Oral Oral Oral  Resp: 18 19 18 18   Height:      Weight:      SpO2: 100% 100% 100% 98%    Intake/Output Summary (Last 24 hours) at 02/22/16 0928 Last data filed at 02/22/16 0849  Gross per 24 hour  Intake    243 ml  Output   1100 ml  Net   -857 ml    LABS: Basic Metabolic Panel: No results for input(s): NA, K, CL, CO2, GLUCOSE, BUN, CREATININE, CALCIUM, MG, PHOS in the last 72 hours. Liver Function Tests: No results for input(s): AST, ALT, ALKPHOS, BILITOT, PROT, ALBUMIN in the last 72 hours. No results for input(s): LIPASE, AMYLASE in the last 72 hours. CBC:  Recent Labs  02/21/16 0406 02/22/16 0535  WBC 16.6* 10.7*  HGB 7.4* 7.4*  HCT 22.3* 22.1*  MCV 85.6 85.8  PLT 181 185   Cardiac Enzymes: No results for input(s): CKTOTAL, CKMB, CKMBINDEX, TROPONINI in the last 72 hours. BNP: Invalid input(s): POCBNP D-Dimer: No results for input(s): DDIMER in the last 72 hours. Hemoglobin A1C: No results for input(s): HGBA1C in the last 72 hours. Fasting Lipid Panel: No results for input(s): CHOL, HDL, LDLCALC, TRIG, CHOLHDL, LDLDIRECT in the last 72 hours. Thyroid Function Tests: No results for input(s): TSH, T4TOTAL, T3FREE, THYROIDAB in the last 72 hours.  Invalid input(s): FREET3 Anemia Panel: No results for input(s): VITAMINB12, FOLATE, FERRITIN, TIBC, IRON, RETICCTPCT in the last 72 hours.   PHYSICAL EXAM General: Well developed, well nourished, in no acute distress HEENT:  Normocephalic and atramatic Neck:  No JVD.  Lungs: Clear bilaterally to auscultation and percussion. Heart: HRRR . Normal S1 and S2  without gallops or murmurs.  Abdomen: Bowel sounds are positive, abdomen soft and non-tender  Msk:  Back normal, normal gait. Normal strength and tone for age. Extremities: No clubbing, cyanosis or edema.   Neuro: Alert and oriented X 3. Psych:  Good affect, responds appropriately  TELEMETRY: non-tele  ASSESSMENT AND PLAN: Syncope related to anemia. Being treated for internal bleeding. He is feeling better. Has heart murmur and no health care for extended time. He should have cardiac workup and he agrees to out patient follow up with [place].  Appointment given.  Active Problems:   GI bleed   Blood in stool   Esophageal varices without bleeding ([place])   Esophagogastric ulcer   Duodenitis   Gastrointestinal hemorrhage associated with anorectal source    Daune Perch, NP 02/22/2016 9:28 AM

## 2016-02-22 NOTE — Discharge Instructions (Signed)
Gastrointestinal Bleeding Gastrointestinal (GI) bleeding means there is bleeding somewhere along the digestive tract, between the mouth and anus. CAUSES  There are many different problems that can cause GI bleeding. Possible causes include:  Esophagitis. This is inflammation, irritation, or swelling of the esophagus.  Hemorrhoids.These are veins that are full of blood (engorged) in the rectum. They cause pain, inflammation, and may bleed.  Anal fissures.These are areas of painful tearing which may bleed. They are often caused by passing hard stool.  Diverticulosis.These are pouches that form on the colon over time, with age, and may bleed significantly.  Diverticulitis.This is inflammation in areas with diverticulosis. It can cause pain, fever, and bloody stools, although bleeding is rare.  Polyps and cancer. Colon cancer often starts out as precancerous polyps.  Gastritis and ulcers.Bleeding from the upper gastrointestinal tract (near the stomach) may travel through the intestines and produce black, sometimes tarry, often bad smelling stools. In certain cases, if the bleeding is fast enough, the stools may not be black, but red. This condition may be life-threatening. SYMPTOMS   Vomiting bright red blood or material that looks like coffee grounds.  Bloody, black, or tarry stools. DIAGNOSIS  Your caregiver may diagnose your condition by taking your history and performing a physical exam. More tests may be needed, including:  X-rays and other imaging tests.  Esophagogastroduodenoscopy (EGD). This test uses a flexible, lighted tube to look at your esophagus, stomach, and small intestine.  Colonoscopy. This test uses a flexible, lighted tube to look at your colon. TREATMENT  Treatment depends on the cause of your bleeding.   For bleeding from the esophagus, stomach, small intestine, or colon, the caregiver doing your EGD or colonoscopy may be able to stop the bleeding as part of  the procedure.  Inflammation or infection of the colon can be treated with medicines.  Many rectal problems can be treated with creams, suppositories, or warm baths.  Surgery is sometimes needed.  Blood transfusions are sometimes needed if you have lost a lot of blood. If bleeding is slow, you may be allowed to go home. If there is a lot of bleeding, you will need to stay in the hospital for observation. HOME CARE INSTRUCTIONS   Take any medicines exactly as prescribed.  Keep your stools soft by eating foods that are high in fiber. These foods include whole grains, legumes, fruits, and vegetables. Prunes (1 to 3 a day) work well for many people.  Drink enough fluids to keep your urine clear or pale yellow. SEEK IMMEDIATE MEDICAL CARE IF:   Your bleeding increases.  You feel lightheaded, weak, or you faint.  You have severe cramps in your back or abdomen.  You pass large blood clots in your stool.  Your problems are getting worse. MAKE SURE YOU:   Understand these instructions.  Will watch your condition.  Will get help right away if you are not doing well or get worse.   This information is not intended to replace advice given to you by your health care provider. Make sure you discuss any questions you have with your health care provider.   Document Released: 11/03/2000 Document Revised: 10/23/2012 Document Reviewed: 04/26/2015 Elsevier Interactive Patient Education 2016 [place] not take any aspirin, advil, motrin, alleve, bc powder

## 2016-02-22 NOTE — Progress Notes (Signed)
Pt received valuables prior to d/c. Assisted with bath and dressed

## 2016-02-22 NOTE — Care Management Important Message (Signed)
Important Message  Patient Details  Name: Craig Maldonado MRN: [id number] Date of Birth: 20-Mar-1951   Medicare Important Message Given:  Yes    Juliann Pulse A Rolla Kedzierski 02/22/2016, 9:57 AM

## 2016-02-22 NOTE — Progress Notes (Signed)
DISCHARGE NOTE:  Pt given discharge instructions, and prescriptions. Pt verbalized understanding. Pt wheeled to car by staff.

## 2016-02-23 LAB — CULTURE, BLOOD (ROUTINE X 2)
CULTURE: NO GROWTH
Culture: NO GROWTH

## 2016-03-06 ENCOUNTER — Other Ambulatory Visit: Payer: Self-pay | Admitting: Orthopedic Surgery

## 2016-03-06 DIAGNOSIS — M75121 Complete rotator cuff tear or rupture of right shoulder, not specified as traumatic: Secondary | ICD-10-CM | POA: Diagnosis not present

## 2016-03-24 ENCOUNTER — Ambulatory Visit
Admission: RE | Admit: 2016-03-24 | Discharge: 2016-03-24 | Disposition: A | Discharge status: Home / Self Care | Payer: Medicare Other | Source: Ambulatory Visit | Attending: Orthopedic Surgery | Admitting: Orthopedic Surgery

## 2016-03-24 DIAGNOSIS — M75121 Complete rotator cuff tear or rupture of right shoulder, not specified as traumatic: Secondary | ICD-10-CM | POA: Diagnosis not present

## 2016-03-24 DIAGNOSIS — M67411 Ganglion, right shoulder: Secondary | ICD-10-CM | POA: Insufficient documentation

## 2016-03-24 DIAGNOSIS — M19011 Primary osteoarthritis, right shoulder: Secondary | ICD-10-CM | POA: Insufficient documentation

## 2016-03-24 DIAGNOSIS — S43081A Other subluxation of right shoulder joint, initial encounter: Secondary | ICD-10-CM | POA: Insufficient documentation

## 2016-03-24 DIAGNOSIS — S46011A Strain of muscle(s) and tendon(s) of the rotator cuff of right shoulder, initial encounter: Secondary | ICD-10-CM | POA: Diagnosis not present

## 2016-03-24 DIAGNOSIS — M25811 Other specified joint disorders, right shoulder: Secondary | ICD-10-CM | POA: Insufficient documentation

## 2016-03-31 DIAGNOSIS — M75121 Complete rotator cuff tear or rupture of right shoulder, not specified as traumatic: Secondary | ICD-10-CM | POA: Diagnosis not present

## 2016-03-31 DIAGNOSIS — M7581 Other shoulder lesions, right shoulder: Secondary | ICD-10-CM | POA: Diagnosis not present

## 2016-03-31 DIAGNOSIS — M19011 Primary osteoarthritis, right shoulder: Secondary | ICD-10-CM | POA: Diagnosis not present

## 2016-08-17 ENCOUNTER — Telehealth: Payer: Self-pay | Admitting: Gastroenterology

## 2016-08-17 NOTE — Telephone Encounter (Signed)
error 

## 2017-09-20 ENCOUNTER — Emergency Department: Payer: Medicare Other

## 2017-09-20 ENCOUNTER — Inpatient Hospital Stay: Payer: Medicare Other

## 2017-09-20 ENCOUNTER — Encounter: Payer: Self-pay | Admitting: Emergency Medicine

## 2017-09-20 ENCOUNTER — Inpatient Hospital Stay
Admission: EM | Admit: 2017-09-20 | Discharge: 2017-09-26 | DRG: 432 | Disposition: A | Discharge status: Home Health | Payer: Medicare Other | Attending: Internal Medicine | Admitting: Internal Medicine

## 2017-09-20 DIAGNOSIS — F17210 Nicotine dependence, cigarettes, uncomplicated: Secondary | ICD-10-CM | POA: Diagnosis present

## 2017-09-20 DIAGNOSIS — T39315A Adverse effect of propionic acid derivatives, initial encounter: Secondary | ICD-10-CM | POA: Diagnosis present

## 2017-09-20 DIAGNOSIS — K7460 Unspecified cirrhosis of liver: Secondary | ICD-10-CM | POA: Diagnosis not present

## 2017-09-20 DIAGNOSIS — W19XXXA Unspecified fall, initial encounter: Secondary | ICD-10-CM | POA: Diagnosis present

## 2017-09-20 DIAGNOSIS — E871 Hypo-osmolality and hyponatremia: Secondary | ICD-10-CM | POA: Diagnosis present

## 2017-09-20 DIAGNOSIS — R059 Cough, unspecified: Secondary | ICD-10-CM

## 2017-09-20 DIAGNOSIS — K259 Gastric ulcer, unspecified as acute or chronic, without hemorrhage or perforation: Secondary | ICD-10-CM | POA: Diagnosis not present

## 2017-09-20 DIAGNOSIS — F1010 Alcohol abuse, uncomplicated: Secondary | ICD-10-CM | POA: Diagnosis present

## 2017-09-20 DIAGNOSIS — I8500 Esophageal varices without bleeding: Secondary | ICD-10-CM | POA: Diagnosis present

## 2017-09-20 DIAGNOSIS — N179 Acute kidney failure, unspecified: Secondary | ICD-10-CM | POA: Diagnosis present

## 2017-09-20 DIAGNOSIS — D500 Iron deficiency anemia secondary to blood loss (chronic): Secondary | ICD-10-CM | POA: Diagnosis present

## 2017-09-20 DIAGNOSIS — N289 Disorder of kidney and ureter, unspecified: Secondary | ICD-10-CM | POA: Diagnosis not present

## 2017-09-20 DIAGNOSIS — Z6828 Body mass index (BMI) 28.0-28.9, adult: Secondary | ICD-10-CM | POA: Diagnosis not present

## 2017-09-20 DIAGNOSIS — K7011 Alcoholic hepatitis with ascites: Secondary | ICD-10-CM | POA: Diagnosis present

## 2017-09-20 DIAGNOSIS — K921 Melena: Secondary | ICD-10-CM | POA: Diagnosis present

## 2017-09-20 DIAGNOSIS — K264 Chronic or unspecified duodenal ulcer with hemorrhage: Secondary | ICD-10-CM | POA: Diagnosis present

## 2017-09-20 DIAGNOSIS — I864 Gastric varices: Secondary | ICD-10-CM | POA: Diagnosis present

## 2017-09-20 DIAGNOSIS — R05 Cough: Secondary | ICD-10-CM | POA: Diagnosis not present

## 2017-09-20 DIAGNOSIS — Z23 Encounter for immunization: Secondary | ICD-10-CM | POA: Diagnosis not present

## 2017-09-20 DIAGNOSIS — R17 Unspecified jaundice: Secondary | ICD-10-CM | POA: Diagnosis not present

## 2017-09-20 DIAGNOSIS — I959 Hypotension, unspecified: Secondary | ICD-10-CM | POA: Diagnosis present

## 2017-09-20 DIAGNOSIS — K208 Other esophagitis: Secondary | ICD-10-CM | POA: Diagnosis not present

## 2017-09-20 DIAGNOSIS — R188 Other ascites: Secondary | ICD-10-CM

## 2017-09-20 DIAGNOSIS — E8809 Other disorders of plasma-protein metabolism, not elsewhere classified: Secondary | ICD-10-CM | POA: Diagnosis present

## 2017-09-20 DIAGNOSIS — E669 Obesity, unspecified: Secondary | ICD-10-CM | POA: Diagnosis present

## 2017-09-20 DIAGNOSIS — Z885 Allergy status to narcotic agent status: Secondary | ICD-10-CM | POA: Diagnosis not present

## 2017-09-20 DIAGNOSIS — K210 Gastro-esophageal reflux disease with esophagitis: Secondary | ICD-10-CM | POA: Diagnosis present

## 2017-09-20 DIAGNOSIS — R740 Nonspecific elevation of levels of transaminase and lactic acid dehydrogenase [LDH]: Secondary | ICD-10-CM | POA: Diagnosis not present

## 2017-09-20 DIAGNOSIS — T502X5A Adverse effect of carbonic-anhydrase inhibitors, benzothiadiazides and other diuretics, initial encounter: Secondary | ICD-10-CM | POA: Diagnosis present

## 2017-09-20 DIAGNOSIS — K766 Portal hypertension: Secondary | ICD-10-CM | POA: Diagnosis present

## 2017-09-20 DIAGNOSIS — N400 Enlarged prostate without lower urinary tract symptoms: Secondary | ICD-10-CM | POA: Diagnosis present

## 2017-09-20 DIAGNOSIS — R7401 Elevation of levels of liver transaminase levels: Secondary | ICD-10-CM | POA: Diagnosis present

## 2017-09-20 DIAGNOSIS — K7031 Alcoholic cirrhosis of liver with ascites: Principal | ICD-10-CM | POA: Diagnosis present

## 2017-09-20 DIAGNOSIS — R531 Weakness: Secondary | ICD-10-CM | POA: Diagnosis not present

## 2017-09-20 DIAGNOSIS — K2980 Duodenitis without bleeding: Secondary | ICD-10-CM | POA: Diagnosis not present

## 2017-09-20 DIAGNOSIS — F1020 Alcohol dependence, uncomplicated: Secondary | ICD-10-CM | POA: Diagnosis present

## 2017-09-20 DIAGNOSIS — R296 Repeated falls: Secondary | ICD-10-CM | POA: Diagnosis not present

## 2017-09-20 DIAGNOSIS — J9811 Atelectasis: Secondary | ICD-10-CM | POA: Diagnosis not present

## 2017-09-20 DIAGNOSIS — I10 Essential (primary) hypertension: Secondary | ICD-10-CM | POA: Diagnosis not present

## 2017-09-20 DIAGNOSIS — K269 Duodenal ulcer, unspecified as acute or chronic, without hemorrhage or perforation: Secondary | ICD-10-CM | POA: Diagnosis not present

## 2017-09-20 DIAGNOSIS — K2210 Ulcer of esophagus without bleeding: Secondary | ICD-10-CM | POA: Diagnosis not present

## 2017-09-20 DIAGNOSIS — E876 Hypokalemia: Secondary | ICD-10-CM | POA: Diagnosis present

## 2017-09-20 DIAGNOSIS — K7290 Hepatic failure, unspecified without coma: Secondary | ICD-10-CM

## 2017-09-20 DIAGNOSIS — D649 Anemia, unspecified: Secondary | ICD-10-CM | POA: Diagnosis not present

## 2017-09-20 DIAGNOSIS — K922 Gastrointestinal hemorrhage, unspecified: Secondary | ICD-10-CM

## 2017-09-20 DIAGNOSIS — K2960 Other gastritis without bleeding: Secondary | ICD-10-CM | POA: Diagnosis not present

## 2017-09-20 HISTORY — DX: Gastric varices: I86.4

## 2017-09-20 HISTORY — DX: Alcoholic cirrhosis of liver without ascites: K70.30

## 2017-09-20 HISTORY — DX: Alcohol abuse, uncomplicated: F10.10

## 2017-09-20 LAB — BASIC METABOLIC PANEL
ANION GAP: 13 (ref 5–15)
BUN: 28 mg/dL — ABNORMAL HIGH (ref 6–20)
CO2: 23 mmol/L (ref 22–32)
Calcium: 8.6 mg/dL — ABNORMAL LOW (ref 8.9–10.3)
Chloride: 93 mmol/L — ABNORMAL LOW (ref 101–111)
Creatinine, Ser: 1.9 mg/dL — ABNORMAL HIGH (ref 0.61–1.24)
GFR calc Af Amer: 41 mL/min — ABNORMAL LOW (ref >60)
GFR calc non Af Amer: 36 mL/min — ABNORMAL LOW (ref >60)
GLUCOSE: 170 mg/dL — AB (ref 65–99)
POTASSIUM: 3.1 mmol/L — AB (ref 3.5–5.1)
Sodium: 129 mmol/L — ABNORMAL LOW (ref 135–145)

## 2017-09-20 LAB — CBC
HEMATOCRIT: 19 % — AB (ref 40.0–52.0)
HEMOGLOBIN: 6 g/dL — AB (ref 13.0–18.0)
MCH: 25.1 pg — AB (ref 26.0–34.0)
MCHC: 31.6 g/dL — AB (ref 32.0–36.0)
MCV: 79.5 fL — ABNORMAL LOW (ref 80.0–100.0)
Platelets: 168 10*3/uL (ref 150–440)
RBC: 2.39 MIL/uL — ABNORMAL LOW (ref 4.40–5.90)
RDW: 26.3 % — ABNORMAL HIGH (ref 11.5–14.5)
WBC: 13.8 10*3/uL — ABNORMAL HIGH (ref 3.8–10.6)

## 2017-09-20 LAB — HEPATIC FUNCTION PANEL
ALK PHOS: 186 U/L — AB (ref 38–126)
ALT: 79 U/L — ABNORMAL HIGH (ref 17–63)
AST: 198 U/L — ABNORMAL HIGH (ref 15–41)
Albumin: 2 g/dL — ABNORMAL LOW (ref 3.5–5.0)
BILIRUBIN DIRECT: 8.5 mg/dL — AB (ref 0.1–0.5)
BILIRUBIN TOTAL: 15.8 mg/dL — AB (ref 0.3–1.2)
Indirect Bilirubin: 7.3 mg/dL — ABNORMAL HIGH (ref 0.3–0.9)
Total Protein: 6.3 g/dL — ABNORMAL LOW (ref 6.5–8.1)

## 2017-09-20 LAB — AMMONIA: Ammonia: 70 umol/L — ABNORMAL HIGH (ref 9–35)

## 2017-09-20 LAB — LIPASE, BLOOD: Lipase: 41 U/L (ref 11–51)

## 2017-09-20 LAB — PREPARE RBC (CROSSMATCH)

## 2017-09-20 MED ORDER — FENTANYL CITRATE (PF) 100 MCG/2ML IJ SOLN
INTRAMUSCULAR | Status: AC
  Administered 2017-09-20: 50 ug via INTRAVENOUS
  Filled 2017-09-20: qty 2

## 2017-09-20 MED ORDER — SODIUM CHLORIDE 0.9 % IV SOLN
10.0000 mL/h | Freq: Once | INTRAVENOUS | Status: AC
  Administered 2017-09-21: 10 mL/h via INTRAVENOUS

## 2017-09-20 MED ORDER — OXYCODONE HCL 5 MG PO TABS
5.0000 mg | ORAL_TABLET | Freq: Four times a day (QID) | ORAL | Status: DC | PRN
  Administered 2017-09-21 – 2017-09-22 (×4): 5 mg via ORAL
  Filled 2017-09-20 (×4): qty 1

## 2017-09-20 MED ORDER — FENTANYL CITRATE (PF) 100 MCG/2ML IJ SOLN
50.0000 ug | Freq: Once | INTRAMUSCULAR | Status: AC
  Administered 2017-09-20: 50 ug via INTRAVENOUS
  Filled 2017-09-20: qty 2

## 2017-09-20 MED ORDER — FENTANYL CITRATE (PF) 100 MCG/2ML IJ SOLN
50.0000 ug | Freq: Once | INTRAMUSCULAR | Status: AC
  Administered 2017-09-20: 50 ug via INTRAVENOUS

## 2017-09-20 MED ORDER — POTASSIUM CHLORIDE CRYS ER 20 MEQ PO TBCR: 40.0000 meq | EXTENDED_RELEASE_TABLET | Freq: Once | ORAL | Status: DC

## 2017-09-20 MED ORDER — POTASSIUM CHLORIDE CRYS ER 20 MEQ PO TBCR
20.0000 meq | EXTENDED_RELEASE_TABLET | Freq: Once | ORAL | Status: AC
  Administered 2017-09-21: 20 meq via ORAL
  Filled 2017-09-20: qty 1

## 2017-09-20 NOTE — ED Triage Notes (Signed)
Patient with complaint of weakness and shortness of breath times three weeks. Patient reports that he has fallen multiple times. Patient states that he noticed today that he was "yellow". Patient also states that he has been having bilateral arm and abdominal cramping times 3 or 4 days.

## 2017-09-20 NOTE — H&P (Signed)
[facility] [place] NAME: Craig Maldonado    MR#:  [id number]  DATE OF BIRTH:  1951-01-08  DATE OF ADMISSION:  09/20/2017  PRIMARY CARE PHYSICIAN: Lucilla Lame, MD   REQUESTING/REFERRING PHYSICIAN: Kerman Passey, MD  CHIEF COMPLAINT:   Chief Complaint  Patient presents with  . Weakness  . Jaundice    HISTORY OF PRESENT ILLNESS:  Craig Maldonado  is a 66 y.o. male who presents with increasing abdominal distention and discomfort.  Patient has a known history of alcoholic cirrhosis with ascites.  Here in the ED today he was found to be jaundiced with a bilirubin significantly elevated, as well as some transaminitis.  He also had a hemoglobin of around 6.  He has a prior history of GI bleed, and has known gastric varices.  He denies any bleeding at this time, but was guaiac positive in the ED.  Hospitalist were called for admission  PAST MEDICAL HISTORY:   Past Medical History:  Diagnosis Date  . Alcohol abuse   . Alcoholic cirrhosis ([place])   . Gastric varices     PAST SURGICAL HISTORY:   Past Surgical History:  Procedure Laterality Date  . COLONOSCOPY WITH PROPOFOL N/A 02/12/2016   Procedure: COLONOSCOPY WITH PROPOFOL;  Surgeon: Lucilla Lame, MD;  Location: [facility] ENDOSCOPY;  Service: Endoscopy;  Laterality: N/A;  . ESOPHAGOGASTRODUODENOSCOPY (EGD) WITH PROPOFOL N/A 02/11/2016   Procedure: ESOPHAGOGASTRODUODENOSCOPY (EGD) WITH PROPOFOL;  Surgeon: Lucilla Lame, MD;  Location: [facility] ENDOSCOPY;  Service: Endoscopy;  Laterality: N/A;  . PERIPHERAL VASCULAR CATHETERIZATION N/A 02/17/2016   Procedure: Visceral Angiography;  Surgeon: Algernon Huxley, MD;  Location: [place] [facility];  Service: Cardiovascular;  Laterality: N/A;  . PERIPHERAL VASCULAR CATHETERIZATION N/A 02/17/2016   Procedure: Visceral Artery Intervention;  Surgeon: Algernon Huxley, MD;  Location: [place] [facility];  Service: Cardiovascular;  Laterality: N/A;  . right leg surgery       SOCIAL HISTORY:   Social History  Substance Use Topics  . Smoking status: Current Every Day Smoker    Packs/day: 0.50  . Smokeless tobacco: Never Used  . Alcohol use 3.6 oz/week    6 Cans of beer per week    FAMILY HISTORY:   Family History  Problem Relation Age of Onset  . Healthy Mother   . Tuberculosis Father     DRUG ALLERGIES:   Allergies  Allergen Reactions  . Codeine Nausea And Vomiting    MEDICATIONS AT HOME:   Prior to Admission medications   Medication Sig Start Date End Date Taking? Authorizing Provider  acetaminophen (TYLENOL) 325 MG tablet Take 2 tablets (650 mg total) by mouth every 6 (six) hours as needed for mild pain (or Fever >/= 101). 02/14/16   Gouru, Aruna, MD  docusate sodium (COLACE) 100 MG capsule Take 1 capsule (100 mg total) by mouth 2 (two) times daily. 02/14/16   Nicholes Mango, MD  ferrous sulfate 325 (65 FE) MG tablet Take 1 tablet (325 mg total) by mouth daily with breakfast. 02/22/16   Loletha Grayer, MD  folic acid (FOLVITE) 1 MG tablet Take 1 tablet (1 mg total) by mouth daily. 02/14/16   Nicholes Mango, MD  furosemide (LASIX) 40 MG tablet TAKE ONE TABLET BY MOUTH ONCE DAILY---PATIENT NEEDS AN APPOINTMENT FOR OFFICE FOLLOW UP TO GET ANY MORE MEDICATION 02/02/16   Arlis Porta., MD  Multiple Vitamin (MULTIVITAMIN WITH MINERALS) TABS tablet Take 1 tablet by mouth daily. 02/14/16  Gouru, Illene Silver, MD  nicotine (NICODERM CQ - DOSED IN MG/24 HOURS) 14 mg/24hr patch Place 1 patch (14 mg total) onto the skin daily. 02/22/16   Loletha Grayer, MD  oxyCODONE (OXY IR/ROXICODONE) 5 MG immediate release tablet Take 1 tablet (5 mg total) by mouth every 6 (six) hours as needed for moderate pain. 02/22/16   Loletha Grayer, MD  polyethylene glycol (MIRALAX / Floria Raveling) packet Take 17 g by mouth daily as needed for moderate constipation. 02/22/16   Loletha Grayer, MD  spironolactone (ALDACTONE) 25 MG tablet TAKE ONE TABLET BY MOUTH ONCE DAILY Patient not  taking: Reported on 02/11/2016 02/02/16   Arlis Porta., MD  thiamine 100 MG tablet Take 1 tablet (100 mg total) by mouth daily. 02/22/16   Loletha Grayer, MD    REVIEW OF SYSTEMS:  Review of Systems  Constitutional: Negative for chills, fever, malaise/fatigue and weight loss.  HENT: Negative for ear pain, hearing loss and tinnitus.   Eyes: Negative for blurred vision, double vision, pain and redness.  Respiratory: Negative for cough, hemoptysis and shortness of breath.   Cardiovascular: Negative for chest pain, palpitations, orthopnea and leg swelling.  Gastrointestinal: Positive for abdominal pain. Negative for constipation, diarrhea, nausea and vomiting.       Distention  Genitourinary: Negative for dysuria, frequency and hematuria.  Musculoskeletal: Negative for back pain, joint pain and neck pain.  Skin:       No acne, rash, or lesions  Neurological: Negative for dizziness, tremors, focal weakness and weakness.  Endo/Heme/Allergies: Negative for polydipsia. Does not bruise/bleed easily.  Psychiatric/Behavioral: Negative for depression. The patient is not nervous/anxious and does not have insomnia.      VITAL SIGNS:   Vitals:   09/20/17 2055 09/20/17 2100 09/20/17 2126 09/20/17 2200  BP: 119/70 106/73  108/85  Pulse: 95 95 91 89  Resp: (!) 28 (!) 27 (!) 25 20  Temp:      TempSrc:      SpO2: 97% 100% 96% 95%  Weight:      Height:       Wt Readings from Last 3 Encounters:  09/20/17 93 kg (205 lb)  02/11/16 100.9 kg (222 lb 6.4 oz)    PHYSICAL EXAMINATION:  Physical Exam  Vitals reviewed. Constitutional: He is oriented to person, place, and time. He appears well-developed and well-nourished. No distress.  HENT:  Head: Normocephalic and atraumatic.  Mouth/Throat: Oropharynx is clear and moist.  Eyes: Pupils are equal, round, and reactive to light. Conjunctivae and EOM are normal. No scleral icterus.  Neck: Normal range of motion. Neck supple. No JVD present. No  thyromegaly present.  Cardiovascular: Normal rate, regular rhythm and intact distal pulses.  Exam reveals no gallop and no friction rub.   No murmur heard. Respiratory: Effort normal and breath sounds normal. No respiratory distress. He has no wheezes. He has no rales.  GI: Soft. Bowel sounds are normal. He exhibits distension (Fluid wave). There is tenderness.  Musculoskeletal: Normal range of motion. He exhibits edema.  No arthritis, no gout  Lymphadenopathy:    He has no cervical adenopathy.  Neurological: He is alert and oriented to person, place, and time. No cranial nerve deficit.  No dysarthria, no aphasia  Skin: Skin is warm and dry. No rash noted. No erythema.  Psychiatric: He has a normal mood and affect. His behavior is normal. Judgment and thought content normal.    LABORATORY PANEL:   CBC  Recent Labs Lab 09/20/17 1950  WBC  13.8*  HGB 6.0*  HCT 19.0*  PLT 168   ------------------------------------------------------------------------------------------------------------------  Chemistries   Recent Labs Lab 09/20/17 1950  NA 129*  K 3.1*  CL 93*  CO2 23  GLUCOSE 170*  BUN 28*  CREATININE 1.90*  CALCIUM 8.6*  AST 198*  ALT 79*  ALKPHOS 186*  BILITOT 15.8*   ------------------------------------------------------------------------------------------------------------------  Cardiac Enzymes No results for input(s): TROPONINI in the last 168 hours. ------------------------------------------------------------------------------------------------------------------  RADIOLOGY:  Dg Chest 2 View  Result Date: 09/20/2017 CLINICAL DATA:  Weakness and dyspnea x3 weeks. Reports multiple falls. EXAM: CHEST  2 VIEW COMPARISON:  02/28/2016 FINDINGS: Borderline cardiomegaly. No aortic aneurysm. Platelike atelectasis and/or scarring at the right lung base with mild elevation of the right hemidiaphragm. No overt pulmonary edema. No acute nor suspicious osseous  abnormalities. IMPRESSION: Borderline cardiomegaly.  Right basilar atelectasis and/or scarring. Electronically Signed   By: Ashley Royalty M.D.   On: 09/20/2017 21:07    EKG:   Orders placed or performed during the hospital encounter of 09/20/17  . EKG 12-Lead  . EKG 12-Lead  . ED EKG  . ED EKG    IMPRESSION AND PLAN:  Principal Problem:   Alcoholic cirrhosis of liver with ascites ([place]) -paracentesis ordered for tomorrow, GI consult.  Ultrasound imaging tonight also suspects portal vein thrombosis, patient will likely need dedicated cross-sectional imaging for this tomorrow Active Problems:   Hyperbilirubinemia -likely related to his cirrhosis, GI consult as above   Transaminitis -treatment as above   AKI (acute kidney injury) ([place]) -unclear if this is potentially related to slow renal flow due to ascites and portal hypertension, versus portal vein thrombosis contributing to the same, workup as above   Alcohol abuse -CIWA protocol  All the records are reviewed and case discussed with ED provider. Management plans discussed with the patient and/or family.  DVT PROPHYLAXIS: Mechanical only  GI PROPHYLAXIS: None  ADMISSION STATUS: Inpatient  CODE STATUS: Full Code Status History    Date Active Date Inactive Code Status Order ID Comments User Context   02/11/2016  7:36 AM 02/22/2016  8:28 PM Full Code [id number]  Loletha Grayer, MD ED      TOTAL TIME TAKING CARE OF THIS PATIENT: 45 minutes.   Sadako Cegielski FIELDING 09/20/2017, 10:47 PM  [facility]  Office  [phone]  CC: Primary care physician; Lucilla Lame, MD  Note:  This document was prepared using Systems analyst and may include unintentional dictation errors.

## 2017-09-20 NOTE — ED Notes (Signed)
Pt transported to US at this time. 

## 2017-09-20 NOTE — ED Notes (Signed)
Pt unable to obtain urine at this time, will continue to monitor for sample

## 2017-09-20 NOTE — ED Notes (Signed)
Patient transported to X-ray 

## 2017-09-20 NOTE — ED Provider Notes (Signed)
[facility] Emergency Department Provider Note  Time seen: 8:37 PM  I have reviewed the triage vital signs and the nursing notes.   HISTORY  Chief Complaint Weakness and Jaundice    HPI Craig Maldonado is a 66 y.o. male with a past medical history cirrhosis, alcohol abuse, GI bleed, presents to the emergency department for abdominal swelling and yellow skin.  According to the patient for the past 3 weeks he has noticed yellowing of his skin.  States he is also noticed increased abdominal swelling over the past 3 weeks and states it has become very tight and uncomfortable which is largely what prompted today's visit.  Denies any fever.  Does state mild cough over the past 3 weeks.  States over the past 2-3 days he has felt extremely weak, states he is having difficulty getting around and performing daily activities due to generalized fatigue/weakness.   History reviewed. No pertinent past medical history.  Patient Active Problem List   Diagnosis Date Noted  . Gastrointestinal hemorrhage associated with anorectal source   . GI bleed 02/11/2016  . Blood in stool   . Esophageal varices without bleeding ([place])   . Esophagogastric ulcer   . Duodenitis   . Cardiac murmur 04/19/2015  . Back pain, chronic 04/19/2015  . Arthralgia of hip 04/19/2015  . Leg pain 04/19/2015  . Current tobacco use 04/19/2015  . Blood glucose elevated 04/19/2015  . Elevated blood sugar 04/19/2015  . Abnormal prostate specific antigen 04/19/2015  . Excoriated eczema 04/19/2015  . Breast development in males 04/19/2015  . Benign hypertension 04/19/2015  . Alcohol induced liver disorder ([place]) 04/19/2015  . Benign prostatic hypertrophy without urinary obstruction 04/19/2015  . Screening for depression 04/19/2015  . Cigarette smoker 04/19/2015  . Routine general medical examination at a health care facility 04/19/2015    Past Surgical History:  Procedure Laterality Date  . COLONOSCOPY  WITH PROPOFOL N/A 02/12/2016   Procedure: COLONOSCOPY WITH PROPOFOL;  Surgeon: Lucilla Lame, MD;  Location: [facility] END[facility];  Service: Endoscopy;  Laterality: N/A;  . ESOPHAGOGASTRODUODENOSCOPY (EGD) WITH PROPOFOL N/A 02/11/2016   Procedure: ESOPHAGOGASTRODUODENOSCOPY (EGD) WITH PROPOFOL;  Surgeon: Lucilla Lame, MD;  Location: [facility];  Service: Endoscopy;  Laterality: N/A;  . PERIPHERAL VASCULAR CATHETERIZATION N/A 02/17/2016   Procedure: Visceral Angiography;  Surgeon: Algernon Huxley, MD;  Location: [place] [facility];  Service: Cardiovascular;  Laterality: N/A;  . PERIPHERAL VASCULAR CATHETERIZATION N/A 02/17/2016   Procedure: Visceral Artery Intervention;  Surgeon: Algernon Huxley, MD;  Location: [place] [facility];  Service: Cardiovascular;  Laterality: N/A;  . right leg surgery      Prior to Admission medications   Medication Sig Start Date End Date Taking? Authorizing Provider  acetaminophen (TYLENOL) 325 MG tablet Take 2 tablets (650 mg total) by mouth every 6 (six) hours as needed for mild pain (or Fever >/= 101). 02/14/16   Gouru, Aruna, MD  docusate sodium (COLACE) 100 MG capsule Take 1 capsule (100 mg total) by mouth 2 (two) times daily. 02/14/16   Nicholes Mango, MD  ferrous sulfate 325 (65 FE) MG tablet Take 1 tablet (325 mg total) by mouth daily with breakfast. 02/22/16   Loletha Grayer, MD  folic acid (FOLVITE) 1 MG tablet Take 1 tablet (1 mg total) by mouth daily. 02/14/16   Nicholes Mango, MD  furosemide (LASIX) 40 MG tablet TAKE ONE TABLET BY MOUTH ONCE DAILY---PATIENT NEEDS AN APPOINTMENT FOR OFFICE FOLLOW UP TO GET ANY MORE MEDICATION 02/02/16  Arlis Porta., MD  Multiple Vitamin (MULTIVITAMIN WITH MINERALS) TABS tablet Take 1 tablet by mouth daily. 02/14/16   Gouru, Illene Silver, MD  nicotine (NICODERM CQ - DOSED IN MG/24 HOURS) 14 mg/24hr patch Place 1 patch (14 mg total) onto the skin daily. 02/22/16   Loletha Grayer, MD  oxyCODONE (OXY IR/ROXICODONE) 5 MG immediate release tablet  Take 1 tablet (5 mg total) by mouth every 6 (six) hours as needed for moderate pain. 02/22/16   Loletha Grayer, MD  polyethylene glycol (MIRALAX / Floria Raveling) packet Take 17 g by mouth daily as needed for moderate constipation. 02/22/16   Loletha Grayer, MD  spironolactone (ALDACTONE) 25 MG tablet TAKE ONE TABLET BY MOUTH ONCE DAILY Patient not taking: Reported on 02/11/2016 02/02/16   Arlis Porta., MD  thiamine 100 MG tablet Take 1 tablet (100 mg total) by mouth daily. 02/22/16   Loletha Grayer, MD    Allergies  Allergen Reactions  . Codeine Nausea And Vomiting    Family History  Problem Relation Age of Onset  . Healthy Mother   . Tuberculosis Father     Social History Social History  Substance Use Topics  . Smoking status: Current Every Day Smoker    Packs/day: 0.50  . Smokeless tobacco: Never Used  . Alcohol use 3.6 oz/week    6 Cans of beer per week    Review of Systems Constitutional: Negative for fever. Cardiovascular: Negative for chest pain. Respiratory: Negative for shortness of breath. Gastrointestinal: Positive for abdominal swelling and discomfort due to tightness.  Negative for vomiting or diarrhea.  Negative for black or bloody stool. Genitourinary: Negative for dysuria.  Positive for urinary frequency. Musculoskeletal: Lower extremity swelling. Neurological: Negative for headache All other ROS negative  ____________________________________________   PHYSICAL EXAM:  VITAL SIGNS: ED Triage Vitals  Enc Vitals Group     BP 09/20/17 1938 (!) 95/55     Pulse Rate 09/20/17 1938 (!) 102     Resp 09/20/17 1938 18     Temp 09/20/17 1938 98.1 F (36.7 C)     Temp Source 09/20/17 1938 Oral     SpO2 09/20/17 1938 100 %     Weight 09/20/17 1939 205 lb (93 kg)     Height 09/20/17 1939 5\' 11"  (1.803 m)     Head Circumference --      Peak Flow --      Pain Score 09/20/17 1937 8     Pain Loc --      Pain Edu? --      Excl. in [place]? --    Constitutional:  Alert and oriented.  No distress, lying in bed calm and comfortable, significant jaundice. Eyes: Normal exam ENT   Head: Normocephalic and atraumatic.   Mouth/Throat: Mucous membranes are moist. Cardiovascular: Normal rate, regular rhythm. No murmur Respiratory: Normal respiratory effort without tachypnea nor retractions. Breath sounds are clear  Gastrointestinal: Distended abdomen with dull percussion, slight diffuse tenderness but no focal tenderness identified no significant tenderness identified.  No rebound or guarding. Musculoskeletal: Moderate lower extremity edema bilaterally, no calf tenderness. Neurologic:  Normal speech and language. No gross focal neurologic deficits  Skin:  Skin is warm, dry and intact, very jaundiced in appearance. Psychiatric: Mood and affect are normal.  ____________________________________________    EKG  EKG reviewed and interpreted by myself shows sinus rhythm at 98 bpm with a slightly widened QRS, normal axis, largely normal intervals with slight PR prolongation, nonspecific ST changes but no  ST elevation.  ____________________________________________    RADIOLOGY  Ultrasound pending  ____________________________________________   INITIAL IMPRESSION / ASSESSMENT AND PLAN / ED COURSE  Pertinent labs & imaging results that were available during my care of the patient were reviewed by me and considered in my medical decision making (see chart for details).  Patient presents to the emergency department for abdominal swelling/tightness, jaundice, and generalized fatigue/weakness.  Differential includes electrolyte abnormality, liver failure, renal failure, dehydration, infectious etiology.  We will check labs, IV hydrate, treat pain while awaiting lab results.  Patient also states a cough over the past 3 weeks we will obtain a chest x-ray.  Patient is abdomen is somewhat distended consistent with ascites, patient states a history of requiring  paracentesis for similar swelling in the past.  Denies any black or bloody stool.  Patient's labs have resulted showing a hemoglobin of 6.0, baseline hemoglobin appears to be around 8.0.  We will check a rectal exam.  Labs also show renal insufficiency with a creatinine 1.9, baseline appears around 0.8.  LFTs are pending, urinalysis pending, chest x-ray pending.  Patient's labs have resulted showing a significant hyperbilirubinemia as well as elevation of LFTs.  This is likely due to alcohol induced cirrhosis however given the acuity we will obtain a right upper quadrant ultrasound to further evaluate.  Patient's bilirubin is currently 6.0.  On rectal examination he has dark stool which is strongly guaiac positive.  We will send a type and screen and order a transfusion for the patient.  Patient agreeable to this plan of care.  Patient will require admission to the hospital for further treatment once the ultrasound has been completed.  I discussed the patient with the hospitalist who will be admitting.  CRITICAL CARE Performed by: Harvest Dark   Total critical care time: 30 minutes  Critical care time was exclusive of separately billable procedures and treating other patients.  Critical care was necessary to treat or prevent imminent or life-threatening deterioration.  Critical care was time spent personally by me on the following activities: development of treatment plan with patient and/or surrogate as well as nursing, discussions with consultants, evaluation of patient's response to treatment, examination of patient, obtaining history from patient or surrogate, ordering and performing treatments and interventions, ordering and review of laboratory studies, ordering and review of radiographic studies, pulse oximetry and re-evaluation of patient's condition.   ____________________________________________   FINAL CLINICAL IMPRESSION(S) / ED DIAGNOSES  Liver failure Acute renal  insufficiency Hyperbilirubinemia    Harvest Dark, MD 09/20/17 2211

## 2017-09-20 NOTE — ED Notes (Signed)
Blood consent signed at this time.

## 2017-09-21 LAB — URINALYSIS, COMPLETE (UACMP) WITH MICROSCOPIC: Specific Gravity, Urine: 1.018 (ref 1.005–1.030)

## 2017-09-21 LAB — MAGNESIUM: Magnesium: 2 mg/dL (ref 1.7–2.4)

## 2017-09-21 LAB — CBC
HCT: 19.5 % — ABNORMAL LOW (ref 40.0–52.0)
Hemoglobin: 6.3 g/dL — ABNORMAL LOW (ref 13.0–18.0)
MCH: 25.9 pg — AB (ref 26.0–34.0)
MCHC: 32.5 g/dL (ref 32.0–36.0)
MCV: 79.8 fL — ABNORMAL LOW (ref 80.0–100.0)
Platelets: 137 10*3/uL — ABNORMAL LOW (ref 150–440)
RBC: 2.44 MIL/uL — ABNORMAL LOW (ref 4.40–5.90)
RDW: 25.7 % — ABNORMAL HIGH (ref 11.5–14.5)
WBC: 10.9 10*3/uL — ABNORMAL HIGH (ref 3.8–10.6)

## 2017-09-21 LAB — IRON AND TIBC
Iron: 36 ug/dL — ABNORMAL LOW (ref 45–182)
SATURATION RATIOS: 16 % — AB (ref 17.9–39.5)
TIBC: 233 ug/dL — AB (ref 250–450)
UIBC: 197 ug/dL

## 2017-09-21 LAB — FERRITIN: Ferritin: 58 ng/mL (ref 24–336)

## 2017-09-21 LAB — BASIC METABOLIC PANEL
Anion gap: 6 (ref 5–15)
BUN: 31 mg/dL — AB (ref 6–20)
CALCIUM: 8.3 mg/dL — AB (ref 8.9–10.3)
CO2: 28 mmol/L (ref 22–32)
CREATININE: 1.85 mg/dL — AB (ref 0.61–1.24)
Chloride: 95 mmol/L — ABNORMAL LOW (ref 101–111)
GFR calc Af Amer: 43 mL/min — ABNORMAL LOW (ref >60)
GFR calc non Af Amer: 37 mL/min — ABNORMAL LOW (ref >60)
GLUCOSE: 122 mg/dL — AB (ref 65–99)
Potassium: 3 mmol/L — ABNORMAL LOW (ref 3.5–5.1)
Sodium: 129 mmol/L — ABNORMAL LOW (ref 135–145)

## 2017-09-21 LAB — RETICULOCYTES
RBC.: 2.48 MIL/uL — AB (ref 4.40–5.90)
Retic Count, Absolute: 220.7 10*3/uL — ABNORMAL HIGH (ref 19.0–183.0)
Retic Ct Pct: 8.9 % — ABNORMAL HIGH (ref 0.4–3.1)

## 2017-09-21 LAB — VITAMIN B12: VITAMIN B 12: 3258 pg/mL — AB (ref 180–914)

## 2017-09-21 LAB — HEMOGLOBIN AND HEMATOCRIT, BLOOD
HEMATOCRIT: 24.1 % — AB (ref 40.0–52.0)
HEMOGLOBIN: 7.9 g/dL — AB (ref 13.0–18.0)

## 2017-09-21 LAB — PROTIME-INR
INR: 1.82
PROTHROMBIN TIME: 20.9 s — AB (ref 11.4–15.2)

## 2017-09-21 LAB — FOLATE: FOLATE: 7.4 ng/mL (ref >5.9)

## 2017-09-21 LAB — PREPARE RBC (CROSSMATCH)

## 2017-09-21 MED ORDER — ONDANSETRON HCL 4 MG/2ML IJ SOLN
4.0000 mg | Freq: Four times a day (QID) | INTRAMUSCULAR | Status: DC | PRN
  Filled 2017-09-21: qty 2

## 2017-09-21 MED ORDER — BENZONATATE 100 MG PO CAPS
200.0000 mg | ORAL_CAPSULE | Freq: Three times a day (TID) | ORAL | Status: DC | PRN
  Administered 2017-09-21 – 2017-09-24 (×3): 200 mg via ORAL
  Filled 2017-09-21 (×3): qty 2

## 2017-09-21 MED ORDER — SODIUM CHLORIDE 0.9 % IV SOLN
50.0000 ug/h | INTRAVENOUS | Status: DC
  Administered 2017-09-21 – 2017-09-24 (×6): 50 ug/h via INTRAVENOUS
  Filled 2017-09-21 (×14): qty 1

## 2017-09-21 MED ORDER — OCTREOTIDE LOAD VIA INFUSION
50.0000 ug | Freq: Once | INTRAVENOUS | Status: AC
  Administered 2017-09-21: 50 ug via INTRAVENOUS
  Filled 2017-09-21: qty 25

## 2017-09-21 MED ORDER — ALBUMIN HUMAN 5 % IV SOLN
12.5000 g | Freq: Once | INTRAVENOUS | Status: AC
  Administered 2017-09-21: 12.5 g via INTRAVENOUS
  Filled 2017-09-21: qty 250

## 2017-09-21 MED ORDER — POTASSIUM CHLORIDE CRYS ER 20 MEQ PO TBCR
40.0000 meq | EXTENDED_RELEASE_TABLET | Freq: Two times a day (BID) | ORAL | Status: AC
  Administered 2017-09-21 (×2): 40 meq via ORAL
  Filled 2017-09-21 (×2): qty 2

## 2017-09-21 MED ORDER — FOLIC ACID 1 MG PO TABS
1.0000 mg | ORAL_TABLET | Freq: Every day | ORAL | Status: DC
  Administered 2017-09-21 – 2017-09-26 (×5): 1 mg via ORAL
  Filled 2017-09-21 (×5): qty 1

## 2017-09-21 MED ORDER — THIAMINE HCL 100 MG/ML IJ SOLN: 100.0000 mg | Freq: Every day | INTRAMUSCULAR | Status: DC

## 2017-09-21 MED ORDER — LORAZEPAM 2 MG/ML IJ SOLN: 1.0000 mg | Freq: Four times a day (QID) | INTRAMUSCULAR | Status: AC | PRN

## 2017-09-21 MED ORDER — GUAIFENESIN-DM 100-10 MG/5ML PO SYRP
5.0000 mL | ORAL_SOLUTION | Freq: Three times a day (TID) | ORAL | Status: DC | PRN
  Administered 2017-09-21 – 2017-09-23 (×2): 5 mL via ORAL
  Filled 2017-09-21 (×2): qty 5

## 2017-09-21 MED ORDER — LORAZEPAM 2 MG PO TABS: 0.0000 mg | ORAL_TABLET | Freq: Four times a day (QID) | ORAL | Status: DC

## 2017-09-21 MED ORDER — VITAMIN B-1 100 MG PO TABS
100.0000 mg | ORAL_TABLET | Freq: Every day | ORAL | Status: DC
  Administered 2017-09-21 – 2017-09-26 (×6): 100 mg via ORAL
  Filled 2017-09-21 (×6): qty 1

## 2017-09-21 MED ORDER — LORAZEPAM 2 MG PO TABS: 0.0000 mg | ORAL_TABLET | Freq: Four times a day (QID) | ORAL | Status: AC

## 2017-09-21 MED ORDER — ADULT MULTIVITAMIN W/MINERALS CH
1.0000 | ORAL_TABLET | Freq: Every day | ORAL | Status: DC
  Administered 2017-09-21 – 2017-09-26 (×5): 1 via ORAL
  Filled 2017-09-21 (×6): qty 1

## 2017-09-21 MED ORDER — ONDANSETRON HCL 4 MG PO TABS
4.0000 mg | ORAL_TABLET | Freq: Four times a day (QID) | ORAL | Status: DC | PRN
  Administered 2017-09-25: 4 mg via ORAL
  Filled 2017-09-21: qty 1

## 2017-09-21 MED ORDER — LORAZEPAM 2 MG PO TABS
0.0000 mg | ORAL_TABLET | Freq: Two times a day (BID) | ORAL | Status: AC
  Administered 2017-09-24: 1 mg via ORAL

## 2017-09-21 MED ORDER — FUROSEMIDE 10 MG/ML IJ SOLN
40.0000 mg | Freq: Once | INTRAMUSCULAR | Status: AC
  Administered 2017-09-21: 40 mg via INTRAVENOUS
  Filled 2017-09-21: qty 4

## 2017-09-21 MED ORDER — LORAZEPAM 1 MG PO TABS
1.0000 mg | ORAL_TABLET | Freq: Four times a day (QID) | ORAL | Status: AC | PRN
  Administered 2017-09-22 – 2017-09-23 (×2): 1 mg via ORAL
  Filled 2017-09-21 (×3): qty 1

## 2017-09-21 MED ORDER — ALBUMIN HUMAN 25 % IV SOLN
25.0000 g | Freq: Three times a day (TID) | INTRAVENOUS | Status: DC
  Administered 2017-09-21 – 2017-09-25 (×11): 25 g via INTRAVENOUS
  Filled 2017-09-21 (×17): qty 100

## 2017-09-21 MED ORDER — POTASSIUM CHLORIDE CRYS ER 20 MEQ PO TBCR: 20.0000 meq | EXTENDED_RELEASE_TABLET | Freq: Once | ORAL | Status: DC

## 2017-09-21 MED ORDER — LORAZEPAM 2 MG PO TABS: 0.0000 mg | ORAL_TABLET | Freq: Two times a day (BID) | ORAL | Status: DC

## 2017-09-21 MED ORDER — SODIUM CHLORIDE 0.9 % IV SOLN
INTRAVENOUS | Status: DC
  Administered 2017-09-21 – 2017-09-23 (×4): via INTRAVENOUS

## 2017-09-21 MED ORDER — DEXTROSE 5 % IV SOLN
1.0000 g | INTRAVENOUS | Status: DC
  Administered 2017-09-21 – 2017-09-26 (×6): 1 g via INTRAVENOUS
  Filled 2017-09-21 (×7): qty 10

## 2017-09-21 MED ORDER — PANTOPRAZOLE SODIUM 40 MG IV SOLR
40.0000 mg | Freq: Two times a day (BID) | INTRAVENOUS | Status: DC
  Administered 2017-09-21 – 2017-09-24 (×7): 40 mg via INTRAVENOUS
  Filled 2017-09-21 (×7): qty 40

## 2017-09-21 MED ORDER — SODIUM CHLORIDE 0.9 % IV SOLN
Freq: Once | INTRAVENOUS | Status: AC
  Administered 2017-09-21: 10:00:00 via INTRAVENOUS

## 2017-09-21 NOTE — Consult Note (Signed)
[facility] CONSULT NOTE    Date: 09/21/2017                  Patient Name:  Craig Maldonado  MRN: [id number]  DOB: 1951/10/05  Age / Sex: 66 y.o., male         PCP: Lucilla Lame, MD                 Service Requesting Consult: Hospitalist                 Reason for Consult: Acute renal failure            History of Present Illness: Patient is a 66 y.o. male with a PMHx of alcohol abuse, cirrhosis of the liver, gastric varices, hyperbilirubinemia, who was admitted to [facility] on 09/20/2017 for evaluation of weakness and increasing abdominal distention and discomfort. Patient has known history of cirrhosis of the liver with ascites. Upon presentation he was found have a very low hemoglobin of 6. He is undergoing blood transfusion at the moment. As above he has history of gastric varices.  We are asked to see the patient for evaluation management of acute renal failure. His baseline creatinine on 02/19/2016 was 0.8. Upon presentation his creatinine was up to 1.9. At the moment creatinine is come down slightly to 1.85. He is also noted to be hypokalemic with a serum potassium of 3.0 and his serum sodium was also low at 129. He also has significant hypoalbuminemia with an albumin of 2.0.   Medications: Outpatient medications: Prescriptions Prior to Admission  Medication Sig Dispense Refill Last Dose  . acetaminophen (TYLENOL) 325 MG tablet Take 2 tablets (650 mg total) by mouth every 6 (six) hours as needed for mild pain (or Fever >/= 101).     Marland Kitchen docusate sodium (COLACE) 100 MG capsule Take 1 capsule (100 mg total) by mouth 2 (two) times daily. 10 capsule 0   . ferrous sulfate 325 (65 FE) MG tablet Take 1 tablet (325 mg total) by mouth daily with breakfast. 30 tablet 0   . folic acid (FOLVITE) 1 MG tablet Take 1 tablet (1 mg total) by mouth daily.     . furosemide (LASIX) 40 MG tablet TAKE ONE TABLET BY MOUTH ONCE DAILY---PATIENT NEEDS AN APPOINTMENT FOR OFFICE FOLLOW UP TO GET  ANY MORE MEDICATION 10 tablet 0 Past Month at Unknown time  . Multiple Vitamin (MULTIVITAMIN WITH MINERALS) TABS tablet Take 1 tablet by mouth daily.     . nicotine (NICODERM CQ - DOSED IN MG/24 HOURS) 14 mg/24hr patch Place 1 patch (14 mg total) onto the skin daily. 28 patch 0   . oxyCODONE (OXY IR/ROXICODONE) 5 MG immediate release tablet Take 1 tablet (5 mg total) by mouth every 6 (six) hours as needed for moderate pain. 30 tablet 0   . polyethylene glycol (MIRALAX / GLYCOLAX) packet Take 17 g by mouth daily as needed for moderate constipation. 30 each 0   . spironolactone (ALDACTONE) 25 MG tablet TAKE ONE TABLET BY MOUTH ONCE DAILY (Patient not taking: Reported on 02/11/2016) 10 tablet 0 Not Taking at Unknown time  . thiamine 100 MG tablet Take 1 tablet (100 mg total) by mouth daily. 30 tablet 0     Current medications: Current Facility-Administered Medications  Medication Dose Route Frequency Provider Last Rate Last Dose  . benzonatate (TESSALON) capsule 200 mg  200 mg Oral TID PRN Lance Coon, MD   200 mg at 09/21/17 0235  .  cefTRIAXone (ROCEPHIN) 1 g in dextrose 5 % 50 mL IVPB  1 g Intravenous Q24H Larene [place], [facility]   Stopped at 09/21/17 1240  . folic acid (FOLVITE) tablet 1 mg  1 mg Oral Daily Lance Coon, MD   1 mg at 09/21/17 1054  . guaiFENesin-dextromethorphan (ROBITUSSIN DM) 100-10 MG/5ML syrup 5 mL  5 mL Oral Q8H PRN Lance Coon, MD   5 mL at 09/21/17 0235  . LORazepam (ATIVAN) tablet 1 mg  1 mg Oral Q6H PRN Lance Coon, MD       Or  . LORazepam (ATIVAN) injection 1 mg  1 mg Intravenous Q6H PRN Lance Coon, MD      . LORazepam (ATIVAN) tablet 0-4 mg  0-4 mg Oral Q6H Bettey Costa, MD       Followed by  . [START ON 09/23/2017] LORazepam (ATIVAN) tablet 0-4 mg  0-4 mg Oral Q12H Mody, Sital, MD      . multivitamin with minerals tablet 1 tablet  1 tablet Oral Daily Lance Coon, MD   1 tablet at 09/21/17 1053  . octreotide (SANDOSTATIN) 500 mcg in sodium chloride 0.9 % 250  mL (2 mcg/mL) infusion  50 mcg/hr Intravenous Continuous Bettey Costa, MD 25 mL/hr at 09/21/17 1210 50 mcg/hr at 09/21/17 1210  . ondansetron (ZOFRAN) tablet 4 mg  4 mg Oral Q6H PRN Lance Coon, MD       Or  . ondansetron [facility]) injection 4 mg  4 mg Intravenous Q6H PRN Lance Coon, MD      . oxyCODONE (Oxy IR/ROXICODONE) immediate release tablet 5 mg  5 mg Oral Q6H PRN Lance Coon, MD   5 mg at 09/21/17 0934  . pantoprazole (PROTONIX) injection 40 mg  40 mg Intravenous Q12H Bettey Costa, MD   40 mg at 09/21/17 1053  . thiamine (VITAMIN B-1) tablet 100 mg  100 mg Oral Daily Lance Coon, MD   100 mg at 09/21/17 1054   Or  . thiamine (B-1) injection 100 mg  100 mg Intravenous Daily Lance Coon, MD          Allergies: Allergies  Allergen Reactions  . Codeine Nausea And Vomiting      Past Medical History: Past Medical History:  Diagnosis Date  . Alcohol abuse   . Alcoholic cirrhosis ([place])   . Gastric varices      Past Surgical History: Past Surgical History:  Procedure Laterality Date  . COLONOSCOPY WITH PROPOFOL N/A 02/12/2016   Procedure: COLONOSCOPY WITH PROPOFOL;  Surgeon: Lucilla Lame, MD;  Location: [facility] ENDOSCOPY;  Service: Endoscopy;  Laterality: N/A;  . ESOPHAGOGASTRODUODENOSCOPY (EGD) WITH PROPOFOL N/A 02/11/2016   Procedure: ESOPHAGOGASTRODUODENOSCOPY (EGD) WITH PROPOFOL;  Surgeon: Lucilla Lame, MD;  Location: [facility] ENDOSCOPY;  Service: Endoscopy;  Laterality: N/A;  . PERIPHERAL VASCULAR CATHETERIZATION N/A 02/17/2016   Procedure: Visceral Angiography;  Surgeon: Algernon Huxley, MD;  Location: [facility];  Service: Cardiovascular;  Laterality: N/A;  . PERIPHERAL VASCULAR CATHETERIZATION N/A 02/17/2016   Procedure: Visceral Artery Intervention;  Surgeon: Algernon Huxley, MD;  Location: [place] [facility];  Service: Cardiovascular;  Laterality: N/A;  . right leg surgery       Family History: Family History  Problem Relation Age of Onset  . Healthy Mother   .  Tuberculosis Father      Social History: Social History   Social History  . Marital status: Married    Spouse name: N/A  . Number of children: N/A  . Years  of education: N/A   Occupational History  . Not on file.   Social History Main Topics  . Smoking status: Current Every Day Smoker    Packs/day: 0.50  . Smokeless tobacco: Never Used  . Alcohol use 3.6 oz/week    6 Cans of beer per week  . Drug use: No  . Sexual activity: Not on file   Other Topics Concern  . Not on file   Social History Narrative  . No narrative on file     Review of Systems: Review of Systems  Constitutional: Positive for malaise/fatigue. Negative for chills and fever.  HENT: Negative for congestion, hearing loss and tinnitus.   Eyes: Negative for blurred vision and double vision.  Respiratory: Negative for cough, sputum production and shortness of breath.   Cardiovascular: Negative for chest pain and orthopnea.  Gastrointestinal: Positive for abdominal pain.  Genitourinary: Negative for dysuria, frequency and urgency.  Musculoskeletal: Negative for back pain and myalgias.  Skin: Negative for itching and rash.  Neurological: Positive for weakness. Negative for dizziness and focal weakness.  Endo/Heme/Allergies: Negative for polydipsia. Does not bruise/bleed easily.  Psychiatric/Behavioral: Negative for depression. The patient is not nervous/anxious.     Vital Signs: Blood pressure 94/62, pulse 79, temperature 98.2 F (36.8 C), temperature source Oral, resp. rate 20, height 5\' 11"  (1.803 m), weight 93 kg (205 lb), SpO2 90 %.  Weight trends: Filed Weights   09/20/17 1939  Weight: 93 kg (205 lb)    Physical Exam: General: NAD, resting in bed  Head: Normocephalic, atraumatic.  Eyes: jaundice  Nose: Mucous membranes moist, not inflammed, nonerythematous.  Throat: Oropharynx nonerythematous, no exudate appreciated.   Neck: Supple, trachea midline.  Lungs:  Normal respiratory effort.  Clear to auscultation BL without crackles or wheezes.  Heart: RRR. S1 and S2 normal without gallop, murmur, or rubs.  Abdomen:  Soft, distended, BS present  Extremities: 1+ pretibial edema.  Neurologic: A&O X3, Motor strength is 5/5 in the all 4 extremities  Skin: No visible rashes, scars.    Lab results: Basic Metabolic Panel:  Recent Labs Lab 09/20/17 1950 09/21/17 0451 09/21/17 0942  NA 129* 129*  --   K 3.1* 3.0*  --   CL 93* 95*  --   CO2 23 28  --   GLUCOSE 170* 122*  --   BUN 28* 31*  --   CREATININE 1.90* 1.85*  --   CALCIUM 8.6* 8.3*  --   MG  --   --  2.0    Liver Function Tests:  Recent Labs Lab 09/20/17 1950  AST 198*  ALT 79*  ALKPHOS 186*  BILITOT 15.8*  PROT 6.3*  ALBUMIN 2.0*    Recent Labs Lab 09/20/17 1950  LIPASE 41    Recent Labs Lab 09/20/17 1950  AMMONIA 70*    CBC:  Recent Labs Lab 09/20/17 1950 09/21/17 0451  WBC 13.8* 10.9*  HGB 6.0* 6.3*  HCT 19.0* 19.5*  MCV 79.5* 79.8*  PLT 168 137*    Cardiac Enzymes: No results for input(s): CKTOTAL, CKMB, CKMBINDEX, TROPONINI in the last 168 hours.  BNP: Invalid input(s): POCBNP  CBG: No results for input(s): GLUCAP in the last 168 hours.  Microbiology: Results for orders placed or performed during the hospital encounter of 02/11/16  CULTURE, BLOOD (ROUTINE X 2) w Reflex to PCR ID Panel     Status: None   Collection Time: 02/18/16  2:30 PM  Result Value Ref Range Status   Specimen  Description BLOOD LEFT ANTECUBITAL  Final   Special Requests BOTTLES DRAWN AEROBIC AND ANAEROBIC  [place]  Final   Culture NO GROWTH 5 DAYS  Final   Report Status 02/23/2016 FINAL  Final  CULTURE, BLOOD (ROUTINE X 2) w Reflex to PCR ID Panel     Status: None   Collection Time: 02/18/16  2:30 PM  Result Value Ref Range Status   Specimen Description BLOOD RIGHT ANTECUBITAL  Final   Special Requests BOTTLES DRAWN AEROBIC AND ANAEROBIC  8CC  Final   Culture NO GROWTH 5 DAYS  Final   Report Status  02/23/2016 FINAL  Final    Coagulation Studies: No results for input(s): LABPROT, INR in the last 72 hours.  Urinalysis:  Recent Labs  09/20/17 1945  COLORURINE ORANGE*  LABSPEC 1.018  PHURINE TEST NOT REPORTED DUE TO COLOR INTERFERENCE OF URINE PIGMENT  GLUCOSEU TEST NOT REPORTED DUE TO COLOR INTERFERENCE OF URINE PIGMENT*  HGBUR TEST NOT REPORTED DUE TO COLOR INTERFERENCE OF URINE PIGMENT*  BILIRUBINUR TEST NOT REPORTED DUE TO COLOR INTERFERENCE OF URINE PIGMENT*  KETONESUR TEST NOT REPORTED DUE TO COLOR INTERFERENCE OF URINE PIGMENT*  PROTEINUR TEST NOT REPORTED DUE TO COLOR INTERFERENCE OF URINE PIGMENT*  NITRITE TEST NOT REPORTED DUE TO COLOR INTERFERENCE OF URINE PIGMENT*  LEUKOCYTESUR TEST NOT REPORTED DUE TO COLOR INTERFERENCE OF URINE PIGMENT*      Imaging: Dg Chest 2 View  Result Date: 09/20/2017 CLINICAL DATA:  Weakness and dyspnea x3 weeks. Reports multiple falls. EXAM: CHEST  2 VIEW COMPARISON:  02/28/2016 FINDINGS: Borderline cardiomegaly. No aortic aneurysm. Platelike atelectasis and/or scarring at the right lung base with mild elevation of the right hemidiaphragm. No overt pulmonary edema. No acute nor suspicious osseous abnormalities. IMPRESSION: Borderline cardiomegaly.  Right basilar atelectasis and/or scarring. Electronically Signed   By: Ashley Royalty M.D.   On: 09/20/2017 21:07   US Abdomen Limited Ruq  Result Date: 09/21/2017 CLINICAL DATA:  Initial evaluation for elevated LFTs, jaundice. EXAM: ULTRASOUND ABDOMEN LIMITED RIGHT UPPER QUADRANT COMPARISON:  Prior CT from 02/11/2016. FINDINGS: Gallbladder: Gallbladder partially contracted, limiting evaluation. No definite shadowing echogenic stones identified. Gallbladder wall thickness mildly increased up to 3.8 mm, suspected to be related to contraction and ascites. No free pericholecystic fluid. No sonographic Murphy sign elicited on exam. Common bile duct: Diameter: 2.5 mm Liver: Liver demonstrates a diffuse  nodular contour with coarse heterogeneous echotexture, consistent with known history of cirrhosis. No focal intrahepatic lesions. Although evaluation of the main portal vein is somewhat limited due to the overlying dense heterogeneous liver, no appreciable flow is visualized within the main portal vein via sonography. Moderate to large volume ascites noted within the partially visualized abdomen. IMPRESSION: 1. Coarse heterogeneous liver with nodular contour, consistent with cirrhosis. 2. Moderate to large volume ascites. 3. No appreciable flow within the main portal vein by sonography, with portal vein thrombosis not excluded. Please note evaluation is limited on this exam due to the overlying dense echogenic liver. Further evaluation with dedicated cross-sectional imaging could be performed for further evaluation as desired. 4. No acute gallbladder pathology identified. No biliary dilatation. Electronically Signed   By: Jeannine Boga M.D.   On: 09/21/2017 00:34      Assessment & Plan: Pt is a 66 y.o. male with a PMHx of alcohol abuse, cirrhosis of the liver, gastric varices, hyperbilirubinemia, who was admitted to [facility] on 09/20/2017 for evaluation of weakness and increasing abdominal distention and discomfort.    1. Acute renal failure  likely related to GI blood loss, diuretic usage, and use of ibuprofen at home. 2. Hyponatremia. 3. Hypokalemia. 4. GI bleed.  Plan: As above acute renal failure secondary to GI blood loss, diuretic usage, and use of ibuprofen. Okay to continue octreotide at this point in time. In addition we will add albumin 25 g IV every 8 hours along with 0.9 normal saline at 50 cc per hour. Agree with discontinuation of diuretic therapy. No urgent indication for dialysis at the moment.  Agree with oral potassium repletion. If potassium does not come up we may need to consider IV potassium repletion. Overall guarded prognosis.

## 2017-09-21 NOTE — Plan of Care (Signed)
Problem: Physical Regulation: Goal: Ability to maintain clinical measurements within normal limits will improve Outcome: Progressing Pt Hgb increased to 6.3 and pt then received two more units of blood

## 2017-09-21 NOTE — Consult Note (Signed)
GI Inpatient Consult Note Kathline Magic, M.D.  Reason for Consult: Melena, Ascites, alcoholic cirrhosis   Attending Requesting Consult: Bettey Costa, M.D.  Outpatient Primary Physician: N/A  History of Present Illness: Craig Maldonado is a 66 y.o. male with a history of Alcoholism presenting with 3-4 days of progressive abdominal distension and melenic stool without nausea, vomiting or hemetemesis. Patient has been seen on a previous hospitalization by Dr. Allen Norris of [facility] who performed an EGD 02/11/16 revealing portal hypertensive gastropathy, Grade I esophageal varices and some superficial gastric ulcers. A colonoscopy was also performed on 02/12/16 showing an inadequate prep with a 30mm transverse colon polyp which was removed by cold snare but was not retrieved. A larger7-14mm polyp was not removed due to GI bleeding that had occurred at that time.  Patient appears somnolent but oriented x 3. He is somewhat dysarthric and difficult to understand, and is a poor historian. He claims he has not undergone a paracentesis for his ascites since "2014". He has been drinking large amounts of alcohol (6-12 beers daily) in recent days though he expresses his desire to quit.  He has mild SOB and abdominal discomfort without frank pain. An ultrasound-guided paracentesis was ordered for today but not performed by radiology.  Past Medical History:  Past Medical History:  Diagnosis Date  . Alcohol abuse   . Alcoholic cirrhosis ([place])   . Gastric varices     Problem List: Patient Active Problem List   Diagnosis Date Noted  . Alcoholic cirrhosis of liver with ascites ([place]) 09/20/2017  . Alcohol abuse 09/20/2017  . Hyperbilirubinemia 09/20/2017  . Transaminitis 09/20/2017  . AKI (acute kidney injury) ([place]) 09/20/2017  . Gastrointestinal hemorrhage associated with anorectal source   . GI bleed 02/11/2016  . Blood in stool   . Esophageal varices without bleeding ([place])   . Esophagogastric ulcer   .  Duodenitis   . Cardiac murmur 04/19/2015  . Back pain, chronic 04/19/2015  . Arthralgia of hip 04/19/2015  . Leg pain 04/19/2015  . Current tobacco use 04/19/2015  . Blood glucose elevated 04/19/2015  . Elevated blood sugar 04/19/2015  . Abnormal prostate specific antigen 04/19/2015  . Excoriated eczema 04/19/2015  . Breast development in males 04/19/2015  . Benign hypertension 04/19/2015  . Alcohol induced liver disorder ([place]) 04/19/2015  . Benign prostatic hypertrophy without urinary obstruction 04/19/2015  . Screening for depression 04/19/2015  . Cigarette smoker 04/19/2015  . Routine general medical examination at a health care facility 04/19/2015    Past Surgical History: Past Surgical History:  Procedure Laterality Date  . COLONOSCOPY WITH PROPOFOL N/A 02/12/2016   Procedure: COLONOSCOPY WITH PROPOFOL;  Surgeon: Lucilla Lame, MD;  Location: [facility] ENDOSCOPY;  Service: Endoscopy;  Laterality: N/A;  . ESOPHAGOGASTRODUODENOSCOPY (EGD) WITH PROPOFOL N/A 02/11/2016   Procedure: ESOPHAGOGASTRODUODENOSCOPY (EGD) WITH PROPOFOL;  Surgeon: Lucilla Lame, MD;  Location: [facility] ENDOSCOPY;  Service: Endoscopy;  Laterality: N/A;  . PERIPHERAL VASCULAR CATHETERIZATION N/A 02/17/2016   Procedure: Visceral Angiography;  Surgeon: Algernon Huxley, MD;  Location: [facility];  Service: Cardiovascular;  Laterality: N/A;  . PERIPHERAL VASCULAR CATHETERIZATION N/A 02/17/2016   Procedure: Visceral Artery Intervention;  Surgeon: Algernon Huxley, MD;  Location: [facility];  Service: Cardiovascular;  Laterality: N/A;  . right leg surgery      Allergies: Allergies  Allergen Reactions  . Codeine Nausea And Vomiting    Home Medications: Prescriptions Prior to Admission  Medication Sig Dispense Refill Last Dose  .  acetaminophen (TYLENOL) 325 MG tablet Take 2 tablets (650 mg total) by mouth every 6 (six) hours as needed for mild pain (or Fever >/= 101).     Marland Kitchen docusate sodium (COLACE) 100 MG capsule  Take 1 capsule (100 mg total) by mouth 2 (two) times daily. 10 capsule 0   . ferrous sulfate 325 (65 FE) MG tablet Take 1 tablet (325 mg total) by mouth daily with breakfast. 30 tablet 0   . folic acid (FOLVITE) 1 MG tablet Take 1 tablet (1 mg total) by mouth daily.     . furosemide (LASIX) 40 MG tablet TAKE ONE TABLET BY MOUTH ONCE DAILY---PATIENT NEEDS AN APPOINTMENT FOR OFFICE FOLLOW UP TO GET ANY MORE MEDICATION 10 tablet 0 Past Month at Unknown time  . Multiple Vitamin (MULTIVITAMIN WITH MINERALS) TABS tablet Take 1 tablet by mouth daily.     . nicotine (NICODERM CQ - DOSED IN MG/24 HOURS) 14 mg/24hr patch Place 1 patch (14 mg total) onto the skin daily. 28 patch 0   . oxyCODONE (OXY IR/ROXICODONE) 5 MG immediate release tablet Take 1 tablet (5 mg total) by mouth every 6 (six) hours as needed for moderate pain. 30 tablet 0   . polyethylene glycol (MIRALAX / GLYCOLAX) packet Take 17 g by mouth daily as needed for moderate constipation. 30 each 0   . spironolactone (ALDACTONE) 25 MG tablet TAKE ONE TABLET BY MOUTH ONCE DAILY (Patient not taking: Reported on 02/11/2016) 10 tablet 0 Not Taking at Unknown time  . thiamine 100 MG tablet Take 1 tablet (100 mg total) by mouth daily. 30 tablet 0    Home medication reconciliation was completed with the patient.   Scheduled Inpatient Medications:   . folic acid  1 mg Oral Daily  . LORazepam  0-4 mg Oral Q6H   Followed by  . [START ON 09/23/2017] LORazepam  0-4 mg Oral Q12H  . multivitamin with minerals  1 tablet Oral Daily  . pantoprazole (PROTONIX) IV  40 mg Intravenous Q12H  . thiamine  100 mg Oral Daily   Or  . thiamine  100 mg Intravenous Daily    Continuous Inpatient Infusions:   . sodium chloride    . albumin human    . cefTRIAXone (ROCEPHIN)  IV Stopped (09/21/17 1240)  . octreotide  (SANDOSTATIN)    IV infusion 50 mcg/hr (09/21/17 1210)    PRN Inpatient Medications:  benzonatate, guaiFENesin-dextromethorphan, LORazepam **OR**  LORazepam, ondansetron **OR** ondansetron (ZOFRAN) IV, oxyCODONE  Family History: family history includes Healthy in his mother; Tuberculosis in his father.   GI Family History: Negative for cirrhosis or cancer.  Social History:   reports that he has been smoking.  He has been smoking about 0.50 packs per day. He has never used smokeless tobacco. He reports that he drinks about 3.6 oz of alcohol per week . He reports that he does not use drugs. The patient denies ETOH, tobacco, or drug use.   ROS  Review of Systems: GI ROS is in the HPI. Review of Systems - General ROS: positive for  - fatigue negative for - sleep disturbance Hematological and Lymphatic ROS: negative for - night sweats, pallor or swollen lymph nodes Endocrine ROS: negative for - galactorrhea, palpitations or temperature intolerance Respiratory ROS: positive for - cough, shortness of breath and sputum changes Cardiovascular ROS: no chest pain or dyspnea on exertion Musculoskeletal ROS: negative Neurological ROS: no TIA or stroke symptoms Dermatological ROS: positive for dry skin  Physical Examination: BP  94/62   Pulse 79   Temp 98.2 F (36.8 C) (Oral)   Resp 20   Ht 5\' 11"  (1.803 m)   Wt 93 kg (205 lb)   SpO2 90%   BMI 28.59 kg/m  Gen: NAD. HEENT: PERRLA. EOMI. +Scleral icterus Neck:supple no nodes Chest: Rel CTA, decr breath sounds CV: sl tachy no gallop Abd: Markedly distended, No rebound but mldly tender diffusely. No masses. Ext: 2+ edema bilaterally Skin:POS jaundice   Data: Lab Results  Component Value Date   WBC 10.9 (H) 09/21/2017   HGB 6.3 (L) 09/21/2017   HCT 19.5 (L) 09/21/2017   MCV 79.8 (L) 09/21/2017   PLT 137 (L) 09/21/2017    Recent Labs Lab 09/20/17 1950 09/21/17 0451  HGB 6.0* 6.3*   Lab Results  Component Value Date   NA 129 (L) 09/21/2017   K 3.0 (L) 09/21/2017   CL 95 (L) 09/21/2017   CO2 28 09/21/2017   BUN 31 (H) 09/21/2017   CREATININE 1.85 (H) 09/21/2017    GLU 117 02/10/2015   Lab Results  Component Value Date   ALT 79 (H) 09/20/2017   AST 198 (H) 09/20/2017   ALKPHOS 186 (H) 09/20/2017   BILITOT 15.8 (H) 09/20/2017   No results for input(s): APTT, INR, PTT in the last 168 hours. CBC Latest Ref Rng & Units 09/21/2017 09/20/2017 02/22/2016  WBC 3.8 - 10.6 K/uL 10.9(H) 13.8(H) 10.7(H)  Hemoglobin 13.0 - 18.0 g/dL 6.3(L) 6.0(L) 7.4(L)  Hematocrit 40.0 - 52.0 % 19.5(L) 19.0(L) 22.1(L)  Platelets 150 - 440 K/uL 137(L) 168 185    STUDIES: Dg Chest 2 View  Result Date: 09/20/2017 CLINICAL DATA:  Weakness and dyspnea x3 weeks. Reports multiple falls. EXAM: CHEST  2 VIEW COMPARISON:  02/28/2016 FINDINGS: Borderline cardiomegaly. No aortic aneurysm. Platelike atelectasis and/or scarring at the right lung base with mild elevation of the right hemidiaphragm. No overt pulmonary edema. No acute nor suspicious osseous abnormalities. IMPRESSION: Borderline cardiomegaly.  Right basilar atelectasis and/or scarring. Electronically Signed   By: Ashley Royalty M.D.   On: 09/20/2017 21:07   US Abdomen Limited Ruq  Result Date: 09/21/2017 CLINICAL DATA:  Initial evaluation for elevated LFTs, jaundice. EXAM: ULTRASOUND ABDOMEN LIMITED RIGHT UPPER QUADRANT COMPARISON:  Prior CT from 02/11/2016. FINDINGS: Gallbladder: Gallbladder partially contracted, limiting evaluation. No definite shadowing echogenic stones identified. Gallbladder wall thickness mildly increased up to 3.8 mm, suspected to be related to contraction and ascites. No free pericholecystic fluid. No sonographic Murphy sign elicited on exam. Common bile duct: Diameter: 2.5 mm Liver: Liver demonstrates a diffuse nodular contour with coarse heterogeneous echotexture, consistent with known history of cirrhosis. No focal intrahepatic lesions. Although evaluation of the main portal vein is somewhat limited due to the overlying dense heterogeneous liver, no appreciable flow is visualized within the main portal vein via  sonography. Moderate to large volume ascites noted within the partially visualized abdomen. IMPRESSION: 1. Coarse heterogeneous liver with nodular contour, consistent with cirrhosis. 2. Moderate to large volume ascites. 3. No appreciable flow within the main portal vein by sonography, with portal vein thrombosis not excluded. Please note evaluation is limited on this exam due to the overlying dense echogenic liver. Further evaluation with dedicated cross-sectional imaging could be performed for further evaluation as desired. 4. No acute gallbladder pathology identified. No biliary dilatation. Electronically Signed   By: Jeannine Boga M.D.   On: 09/21/2017 00:34   @IMAGES @  Assessment: 1. Child PUGH class "C" cirrhosis with 11-12 pts (No  INR performed thus far today). Appears worse than on previous hospital admission. 2. Melena - on octreotide. Likely related to portal hypertensive gastropathy vs PUD. Will likely need Endoscopy at some point.  3. Anemia secondary to GI blood loss. 4. Alcoholism 5. Obesity. 6. Hyperbilirubinemia.- Suggestive with elevated aminotransferases of alcoholic hepatitis. Need PT/INR to calculate Maddrey discriminant function to assess suitability for steroid therapy.   Recommendations: 1. Continue octreotide, blood transfusions as tolerated. Slow transfusions. Serial hematocrit checks.  2. Acid suppression. 3. Check PT/INR. Ordered. 4. EGD when clinically feasible. 5. Will follow closely.   Thank you for the consult. Please call with questions or concerns.  Olean Ree, MD  09/21/2017 6:03 PM

## 2017-09-21 NOTE — Progress Notes (Signed)
[facility] NAME: Craig Maldonado    MR#:  [id number]  DATE OF BIRTH:  28-Jul-1951  SUBJECTIVE:   Patient presents to the ER due to fall and Increasing abdominal girth.  REVIEW OF SYSTEMS:    Review of Systems  Constitutional: Negative for fever, chills weight loss HENT: Negative for ear pain, nosebleeds, congestion, facial swelling, rhinorrhea, neck pain, neck stiffness and ear discharge.   Respiratory: Negative for cough, positive shortness of breath, NO wheezing  Cardiovascular: Negative for chest pain, palpitations and  Positive for leg swelling.  Gastrointestinal: Positive dark-colored stools. Positive abdominal distention. No nausea or vomiting. Positive for melena Genitourinary: Negative for dysuria, urgency, frequency, hematuria Musculoskeletal: Negative for back pain or joint pain Positive left leg pain  Neurological: Negative for dizziness, seizures, syncope, focal weakness,  numbness and headaches.  Hematological:  bruises easily Psychiatric/Behavioral: Negative for hallucinations, confusion, dysphoric mood    Tolerating Diet: npo      DRUG ALLERGIES:   Allergies  Allergen Reactions  . Codeine Nausea And Vomiting    VITALS:  Blood pressure (!) 86/52, pulse 84, temperature 98.1 F (36.7 C), temperature source Oral, resp. rate 18, height 5\' 11"  (1.803 m), weight 93 kg (205 lb), SpO2 94 %.  PHYSICAL EXAMINATION:  Constitutional: Appears well-developed and well-nourished. No distress. HENT: Normocephalic. Marland Kitchen Oropharynx is clear and moist.  Eyes: Conjunctivae and EOM are normal. PERRLA, no scleral icterus.  Neck: Normal ROM. Neck supple.  JVD to level of jaw. No tracheal deviation. CVS: RRR, S1/S2 +, no murmurs, no gallops, no carotid bruit.  Pulmonary: Decreased breath sounds throughout lung fields, no stridor, rhonchi, wheezes, rales.  Abdominal: Soft. Hypoactive BS ,   severe distention without guarding or  tenderness no rebound  Musculoskeletal: Normal range of motion. 2+ lower extremity edema  Neuro: Alert. CN 2-12 grossly intact. No focal deficits. Skin: Large bruise noted left groin and extends the back of the thigh. Psychiatric: Normal mood and affect.      LABORATORY PANEL:   CBC  Recent Labs Lab 09/21/17 0451  WBC 10.9*  HGB 6.3*  HCT 19.5*  PLT 137*   ------------------------------------------------------------------------------------------------------------------  Chemistries   Recent Labs Lab 09/20/17 1950 09/21/17 0451  NA 129* 129*  K 3.1* 3.0*  CL 93* 95*  CO2 23 28  GLUCOSE 170* 122*  BUN 28* 31*  CREATININE 1.90* 1.85*  CALCIUM 8.6* 8.3*  AST 198*  --   ALT 79*  --   ALKPHOS 186*  --   BILITOT 15.8*  --    ------------------------------------------------------------------------------------------------------------------  Cardiac Enzymes No results for input(s): TROPONINI in the last 168 hours. ------------------------------------------------------------------------------------------------------------------  RADIOLOGY:  Dg Chest 2 View  Result Date: 09/20/2017 CLINICAL DATA:  Weakness and dyspnea x3 weeks. Reports multiple falls. EXAM: CHEST  2 VIEW COMPARISON:  02/28/2016 FINDINGS: Borderline cardiomegaly. No aortic aneurysm. Platelike atelectasis and/or scarring at the right lung base with mild elevation of the right hemidiaphragm. No overt pulmonary edema. No acute nor suspicious osseous abnormalities. IMPRESSION: Borderline cardiomegaly.  Right basilar atelectasis and/or scarring. Electronically Signed   By: Ashley Royalty M.D.   On: 09/20/2017 21:07   US Abdomen Limited Ruq  Result Date: 09/21/2017 CLINICAL DATA:  Initial evaluation for elevated LFTs, jaundice. EXAM: ULTRASOUND ABDOMEN LIMITED RIGHT UPPER QUADRANT COMPARISON:  Prior CT from 02/11/2016. FINDINGS: Gallbladder: Gallbladder partially contracted, limiting evaluation. No definite shadowing  echogenic stones identified. Gallbladder wall thickness mildly increased up to 3.8 mm,  suspected to be related to contraction and ascites. No free pericholecystic fluid. No sonographic Murphy sign elicited on exam. Common bile duct: Diameter: 2.5 mm Liver: Liver demonstrates a diffuse nodular contour with coarse heterogeneous echotexture, consistent with known history of cirrhosis. No focal intrahepatic lesions. Although evaluation of the main portal vein is somewhat limited due to the overlying dense heterogeneous liver, no appreciable flow is visualized within the main portal vein via sonography. Moderate to large volume ascites noted within the partially visualized abdomen. IMPRESSION: 1. Coarse heterogeneous liver with nodular contour, consistent with cirrhosis. 2. Moderate to large volume ascites. 3. No appreciable flow within the main portal vein by sonography, with portal vein thrombosis not excluded. Please note evaluation is limited on this exam due to the overlying dense echogenic liver. Further evaluation with dedicated cross-sectional imaging could be performed for further evaluation as desired. 4. No acute gallbladder pathology identified. No biliary dilatation. Electronically Signed   By: Jeannine Boga M.D.   On: 09/21/2017 00:34     ASSESSMENT AND PLAN:   66 year old male with history of EtOH abuse and liver cirrhosis who presents after fall and increasing abdominal pain   1. Symptomatic anemia with melena/GIB with hx of varices:   PATIENT HAS BEEN TRANSFUSED 1 UNIT AND HEMOGLOBIN NOW IS 6.3 FROM 6 transfuse 2 more units   Lasix in between units Iron panel ordered Order Protonix 40 IV every 12 hours and OCtreotide gtt GI coverage after 7 PM tonight CBC in am   2. Liver cirrhosis with ascites: Plan for paracentesis today Empiric Rocephin given GI bleed and ascites Follow-up on results of paracentesis in GI consultation Ordered albumin prior to paracentesis.  3. Elevated  LFTs in the setting of EtOH and cirrhosis  4. Acute kidney injury in the setting of liver cirrhosis and anemia Nephrology consultation requested   5. Hypotension: Patient will be transfused PRBCs prior to paracentesis Albumin ordered Monitor blood pressure  6. EtOH abuse: Continue CIWA protocol  7. Hypokalemia: This is been repleted. Pharmacy consultation for electrolyte abnormalities.  8.Tobacco dependence: Patient is encouraged to quit smoking. Counseling was provided for 4 minutes.  9. Hyponatremia: I am suspecting this in part due to liver cirrhosis Continue to monitor  Patient will eventually need physical therapy consultation for disposition.   Management plans discussed with the patient and he is in agreement.  CODE STATUS: FULL  TOTAL TIME TAKING CARE OF THIS PATIENT: 31 minutes.     POSSIBLE D/C 4 days, DEPENDING ON CLINICAL CONDITION.   Kemond Amorin M.D on 09/21/2017 at 9:03 AM  Between 7am to 6pm - Pager - [phone] After 6pm go to [email or web address] - password [email or web address] [facility]  Office  [phone]  CC: Primary care physician; Lucilla Lame, MD  Note: This dictation was prepared with Dragon dictation along with smaller phrase technology. Any transcriptional errors that result from this process are unintentional.

## 2017-09-21 NOTE — Progress Notes (Signed)
ANTIBIOTIC CONSULT NOTE - INITIAL  Pharmacy Consult for rocephin Indication: intra-abdominal infection  Allergies  Allergen Reactions  . Codeine Nausea And Vomiting    Patient Measurements: Height: 5\' 11"  (180.3 cm) Weight: 205 lb (93 kg) IBW/kg (Calculated) : 75.3 Adjusted Body Weight:   Vital Signs: Temp: 98.1 F (36.7 C) (11/02 0810) Temp Source: Oral (11/02 0810) BP: 86/52 (11/02 0812) Pulse Rate: 84 (11/02 0812) Intake/Output from previous day: 11/01 0701 - 11/02 0700 In: 500 [P.O.:120; I.V.:100; Blood:280] Out: -  Intake/Output from this shift: Total I/O In: -  Out: 100 [Urine:100]  Labs:  Recent Labs  09/20/17 1950 09/21/17 0451  WBC 13.8* 10.9*  HGB 6.0* 6.3*  PLT 168 137*  CREATININE 1.90* 1.85*   Estimated Creatinine Clearance: 47 mL/min (A) (by C-G formula based on SCr of 1.85 mg/dL (H)). No results for input(s): VANCOTROUGH, VANCOPEAK, VANCORANDOM, GENTTROUGH, GENTPEAK, GENTRANDOM, TOBRATROUGH, TOBRAPEAK, TOBRARND, AMIKACINPEAK, AMIKACINTROU, AMIKACIN in the last 72 hours.   Microbiology: No results found for this or any previous visit (from the past 720 hour(s)).  Medical History: Past Medical History:  Diagnosis Date  . Alcohol abuse   . Alcoholic cirrhosis ([place])   . Gastric varices    Assessment: Pharmacy consulted to dose and monitor rocephin in this patient with cirrhosis of liver. Being started on rocephin due to GI bleed and ascites  Goal of Therapy:    Plan:  Will start Rocephin 1 g IV q24 hours.   Daril Warga D 09/21/2017,10:03 AM

## 2017-09-21 NOTE — Progress Notes (Signed)
MEDICATION RELATED CONSULT NOTE - INITIAL   Pharmacy Consult for Electrolyte management Indication: hypokalemia  Allergies  Allergen Reactions  . Codeine Nausea And Vomiting    Patient Measurements: Height: 5\' 11"  (180.3 cm) Weight: 205 lb (93 kg) IBW/kg (Calculated) : 75.3 Adjusted Body Weight:   Vital Signs: Temp: 98.1 F (36.7 C) (11/02 0810) Temp Source: Oral (11/02 0810) BP: 86/52 (11/02 0812) Pulse Rate: 84 (11/02 0812) Intake/Output from previous day: 11/01 0701 - 11/02 0700 In: 500 [P.O.:120; I.V.:100; Blood:280] Out: -  Intake/Output from this shift: Total I/O In: -  Out: 100 [Urine:100]  Labs:  Recent Labs  09/20/17 1950 09/21/17 0451  WBC 13.8* 10.9*  HGB 6.0* 6.3*  HCT 19.0* 19.5*  PLT 168 137*  CREATININE 1.90* 1.85*  ALBUMIN 2.0*  --   PROT 6.3*  --   AST 198*  --   ALT 79*  --   ALKPHOS 186*  --   BILITOT 15.8*  --   BILIDIR 8.5*  --   IBILI 7.3*  --    Estimated Creatinine Clearance: 47 mL/min (A) (by C-G formula based on SCr of 1.85 mg/dL (H)).   Microbiology: No results found for this or any previous visit (from the past 720 hour(s)).  Medical History: Past Medical History:  Diagnosis Date  . Alcohol abuse   . Alcoholic cirrhosis ([place])   . Gastric varices     Assessment: Pharmacy consulted to manage electrolytes in this 66 year old woman. K= 3.0   Goal of Therapy:  K= 3.5-5  Plan:  Will give KCl 40 mEq PO BID x 2 doses. Will recheck K @ 1800.   Lenee Franze D 09/21/2017,10:07 AM

## 2017-09-21 NOTE — ED Notes (Signed)
Pt transport to 225

## 2017-09-22 LAB — COMPREHENSIVE METABOLIC PANEL
ALBUMIN: 2.7 g/dL — AB (ref 3.5–5.0)
ALT: 65 U/L — ABNORMAL HIGH (ref 17–63)
AST: 142 U/L — AB (ref 15–41)
Alkaline Phosphatase: 164 U/L — ABNORMAL HIGH (ref 38–126)
Anion gap: 8 (ref 5–15)
BUN: 32 mg/dL — AB (ref 6–20)
CHLORIDE: 97 mmol/L — AB (ref 101–111)
CO2: 26 mmol/L (ref 22–32)
Calcium: 8.4 mg/dL — ABNORMAL LOW (ref 8.9–10.3)
Creatinine, Ser: 1.89 mg/dL — ABNORMAL HIGH (ref 0.61–1.24)
GFR calc Af Amer: 42 mL/min — ABNORMAL LOW (ref >60)
GFR, EST NON AFRICAN AMERICAN: 36 mL/min — AB (ref >60)
Glucose, Bld: 135 mg/dL — ABNORMAL HIGH (ref 65–99)
POTASSIUM: 4.1 mmol/L (ref 3.5–5.1)
Sodium: 131 mmol/L — ABNORMAL LOW (ref 135–145)
Total Bilirubin: 17.1 mg/dL — ABNORMAL HIGH (ref 0.3–1.2)
Total Protein: 6.2 g/dL — ABNORMAL LOW (ref 6.5–8.1)

## 2017-09-22 LAB — CBC
HEMATOCRIT: 23.9 % — AB (ref 40.0–52.0)
Hemoglobin: 7.7 g/dL — ABNORMAL LOW (ref 13.0–18.0)
MCH: 25.8 pg — ABNORMAL LOW (ref 26.0–34.0)
MCHC: 32.2 g/dL (ref 32.0–36.0)
MCV: 80.1 fL (ref 80.0–100.0)
Platelets: 126 10*3/uL — ABNORMAL LOW (ref 150–440)
RBC: 2.98 MIL/uL — ABNORMAL LOW (ref 4.40–5.90)
RDW: 23.7 % — AB (ref 11.5–14.5)
WBC: 7.8 10*3/uL (ref 3.8–10.6)

## 2017-09-22 LAB — URINALYSIS, COMPLETE (UACMP) WITH MICROSCOPIC
Glucose, UA: NEGATIVE mg/dL
HGB URINE DIPSTICK: NEGATIVE
Ketones, ur: NEGATIVE mg/dL
LEUKOCYTES UA: NEGATIVE
Nitrite: POSITIVE — AB
PROTEIN: NEGATIVE mg/dL
Specific Gravity, Urine: 1.011 (ref 1.005–1.030)
pH: 5 (ref 5.0–8.0)

## 2017-09-22 LAB — HIV ANTIBODY (ROUTINE TESTING W REFLEX): HIV Screen 4th Generation wRfx: NONREACTIVE

## 2017-09-22 MED ORDER — PENTOXIFYLLINE ER 400 MG PO TBCR
400.0000 mg | EXTENDED_RELEASE_TABLET | Freq: Three times a day (TID) | ORAL | Status: DC
  Administered 2017-09-22 – 2017-09-26 (×9): 400 mg via ORAL
  Filled 2017-09-22 (×14): qty 1

## 2017-09-22 MED ORDER — OXYCODONE HCL 5 MG PO TABS
5.0000 mg | ORAL_TABLET | ORAL | Status: DC | PRN
  Administered 2017-09-22 – 2017-09-25 (×3): 5 mg via ORAL
  Filled 2017-09-22 (×3): qty 1

## 2017-09-22 NOTE — Progress Notes (Signed)
MEDICATION RELATED CONSULT NOTE - INITIAL   Pharmacy Consult for Electrolyte management Indication: hypokalemia  Allergies  Allergen Reactions  . Codeine Nausea And Vomiting    Patient Measurements: Height: 5\' 11"  (180.3 cm) Weight: 226 lb 14.4 oz (102.9 kg) IBW/kg (Calculated) : 75.3 Adjusted Body Weight:   Vital Signs: Temp: 98.5 F (36.9 C) (11/03 0437) Temp Source: Oral (11/03 0437) BP: 93/56 (11/03 0437) Pulse Rate: 74 (11/03 0437) Intake/Output from previous day: 11/02 0701 - 11/03 0700 In: 1969.6 [I.V.:1103.3; Blood:629; IV Piggyback:237.4] Out: 700 [Urine:700] Intake/Output from this shift: No intake/output data recorded.  Labs:  Recent Labs  09/20/17 1950 09/21/17 0451 09/21/17 0942 09/21/17 1927 09/22/17 0456  WBC 13.8* 10.9*  --   --  7.8  HGB 6.0* 6.3*  --  7.9* 7.7*  HCT 19.0* 19.5*  --  24.1* 23.9*  PLT 168 137*  --   --  126*  CREATININE 1.90* 1.85*  --   --  1.89*  MG  --   --  2.0  --   --   ALBUMIN 2.0*  --   --   --  2.7*  PROT 6.3*  --   --   --  6.2*  AST 198*  --   --   --  142*  ALT 79*  --   --   --  65*  ALKPHOS 186*  --   --   --  164*  BILITOT 15.8*  --   --   --  17.1*  BILIDIR 8.5*  --   --   --   --   IBILI 7.3*  --   --   --   --    Estimated Creatinine Clearance: 48.2 mL/min (A) (by C-G formula based on SCr of 1.89 mg/dL (H)).   Microbiology: No results found for this or any previous visit (from the past 720 hour(s)).  Medical History: Past Medical History:  Diagnosis Date  . Alcohol abuse   . Alcoholic cirrhosis ([place])   . Gastric varices     Assessment: Pharmacy consulted to manage electrolytes in this 66 year old woman. Pt admitted w/ GI blees, liver cirrhosis with ascities, etoh abuse.  K =4.1 this AM after replacement  Goal of Therapy:  K= 3.5-5  Plan:  No replacement needed. Recheck in the AM  [employer], Pharm.D, BCPS Clinical Pharmacist  09/22/2017,7:01 AM

## 2017-09-22 NOTE — Progress Notes (Signed)
Subjective: Since I last evaluated the patient he has had no recurrent melena. Denies abdominal pain, but still has massive ascites.   Objective: Vital signs in last 24 hours: Temp:  [97.9 F (36.6 C)-98.5 F (36.9 C)] 98 F (36.7 C) (11/03 1147) Pulse Rate:  [74-81] 80 (11/03 1147) Resp:  [16-20] 16 (11/03 0833) BP: (87-103)/(54-64) 103/64 (11/03 1147) SpO2:  [90 %-100 %] 94 % (11/03 1147) Weight:  [102.9 kg (226 lb 14.4 oz)] 102.9 kg (226 lb 14.4 oz) (11/03 0437) Last BM Date: 09/20/17  Intake/Output from previous day: 11/02 0701 - 11/03 0700 In: 1969.6 [I.V.:1103.3; Blood:629; IV Piggyback:237.4] Out: 700 [Urine:700] Intake/Output this shift: Total I/O In: -  Out: 275 [Urine:275]  General appearance: alert, cooperative and appears stated age Resp: clear to auscultation bilaterally Cardio: regular rate and rhythm, S1, S2 normal, no murmur, click, rub or gallop GI: abnormal findings:  ascites, shifting dullness and non-tender Extremities: edema 2+  Lab Results:  Recent Labs  09/20/17 1950 09/21/17 0451 09/21/17 1927 09/22/17 0456  WBC 13.8* 10.9*  --  7.8  HGB 6.0* 6.3* 7.9* 7.7*  HCT 19.0* 19.5* 24.1* 23.9*  PLT 168 137*  --  126*   BMET  Recent Labs  09/20/17 1950 09/21/17 0451 09/22/17 0456  NA 129* 129* 131*  K 3.1* 3.0* 4.1  CL 93* 95* 97*  CO2 23 28 26   GLUCOSE 170* 122* 135*  BUN 28* 31* 32*  CREATININE 1.90* 1.85* 1.89*  CALCIUM 8.6* 8.3* 8.4*   LFT  Recent Labs  09/20/17 1950 09/22/17 0456  PROT 6.3* 6.2*  ALBUMIN 2.0* 2.7*  AST 198* 142*  ALT 79* 65*  ALKPHOS 186* 164*  BILITOT 15.8* 17.1*  BILIDIR 8.5*  --   IBILI 7.3*  --    PT/INR  Recent Labs  09/21/17 1927  LABPROT 20.9*  INR 1.82   Hepatitis Panel No results for input(s): HEPBSAG, HCVAB, HEPAIGM, HEPBIGM in the last 72 hours. C-Diff No results for input(s): CDIFFTOX in the last 72 hours. No results for input(s): CDIFFPCR in the last 72 hours. Fecal  Lactopherrin No results for input(s): FECLLACTOFRN in the last 72 hours.  Studies/Results: Dg Chest 2 View  Result Date: 09/20/2017 CLINICAL DATA:  Weakness and dyspnea x3 weeks. Reports multiple falls. EXAM: CHEST  2 VIEW COMPARISON:  02/28/2016 FINDINGS: Borderline cardiomegaly. No aortic aneurysm. Platelike atelectasis and/or scarring at the right lung base with mild elevation of the right hemidiaphragm. No overt pulmonary edema. No acute nor suspicious osseous abnormalities. IMPRESSION: Borderline cardiomegaly.  Right basilar atelectasis and/or scarring. Electronically Signed   By: Ashley Royalty M.D.   On: 09/20/2017 21:07   US Abdomen Limited Ruq  Result Date: 09/21/2017 CLINICAL DATA:  Initial evaluation for elevated LFTs, jaundice. EXAM: ULTRASOUND ABDOMEN LIMITED RIGHT UPPER QUADRANT COMPARISON:  Prior CT from 02/11/2016. FINDINGS: Gallbladder: Gallbladder partially contracted, limiting evaluation. No definite shadowing echogenic stones identified. Gallbladder wall thickness mildly increased up to 3.8 mm, suspected to be related to contraction and ascites. No free pericholecystic fluid. No sonographic Murphy sign elicited on exam. Common bile duct: Diameter: 2.5 mm Liver: Liver demonstrates a diffuse nodular contour with coarse heterogeneous echotexture, consistent with known history of cirrhosis. No focal intrahepatic lesions. Although evaluation of the main portal vein is somewhat limited due to the overlying dense heterogeneous liver, no appreciable flow is visualized within the main portal vein via sonography. Moderate to large volume ascites noted within the partially visualized abdomen. IMPRESSION: 1. Coarse heterogeneous liver  with nodular contour, consistent with cirrhosis. 2. Moderate to large volume ascites. 3. No appreciable flow within the main portal vein by sonography, with portal vein thrombosis not excluded. Please note evaluation is limited on this exam due to the overlying dense  echogenic liver. Further evaluation with dedicated cross-sectional imaging could be performed for further evaluation as desired. 4. No acute gallbladder pathology identified. No biliary dilatation. Electronically Signed   By: Jeannine Boga M.D.   On: 09/21/2017 00:34    Medications: I have reviewed the patient's current medications.  Assessment/Plan: 1. 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  2. Alcoholic hepatitis - Maddrey discriminant function is 58, higher than enough to recommend glucocorticoid therapy. However, it is possible patient has SBP and would benefit from ongoing antibiotic therapy. At this point, since the patient does not appear critically ill, would forego glucocorticoids and favor pentoxyfilline instead.  3. Melena - Stable. Needs EGD, however, for potential prophylactic variceal banding if indicated to prevent worsening blood loss. Will plan EGD. The patient understands the nature of the planned procedure, indications, risks, alternatives and potential complications including but not limited to bleeding, infection, perforation, damage to internal organs and possible oversedation/side effects from anesthesia. The patient agrees and gives consent to proceed.  Please refer to procedure notes for findings, recommendations and patient disposition/instructions.  4. Ascites - on Rocephin. No pain today. Stable. Possible SBP, paracentesis not performed yet.   Following. Further recommendations to follow.  LOS: 2 days   Olean Ree 09/22/2017, 1:07 PM

## 2017-09-22 NOTE — Progress Notes (Signed)
[facility]  ROUNDING NOTE   Subjective:  Creatinine currently about the same. BUN 32 with a creatinine of 1.89. Hyponatremia slightly improved with a serum sodium of 131. Hemoglobin improved post transfusion to 7.7. Urine output 700 cc over the preceding 24 hours.   Objective:  Vital signs in last 24 hours:  Temp:  [97.9 F (36.6 C)-98.5 F (36.9 C)] 98 F (36.7 C) (11/03 1147) Pulse Rate:  [74-80] 80 (11/03 1147) Resp:  [16-20] 16 (11/03 0833) BP: (92-103)/(56-64) 103/64 (11/03 1147) SpO2:  [90 %-100 %] 94 % (11/03 1147) Weight:  [102.9 kg (226 lb 14.4 oz)] 102.9 kg (226 lb 14.4 oz) (11/03 0437)  Weight change: 9.934 kg (21 lb 14.4 oz) Filed Weights   09/20/17 1939 09/22/17 0437  Weight: 93 kg (205 lb) 102.9 kg (226 lb 14.4 oz)    Intake/Output: I/O last 3 completed shifts: In: 2469.6 [P.O.:120; I.V.:1203.3; Blood:909; IV Piggyback:237.4] Out: 700 [Urine:700]   Intake/Output this shift:  Total I/O In: 495.8 [P.O.:100; I.V.:395.8] Out: 475 [Urine:475]  Physical Exam: General: No acute distress  Head: Normocephalic, atraumatic. Moist oral mucosal membranes  Eyes: Icterus noted  Neck: Supple, trachea midline  Lungs:  Clear to auscultation, normal effort  Heart: S1S2 no rubs  Abdomen:  Soft, distension, BS present  Extremities: 2 peripheral edema.  Neurologic: Awake, alert, following commands  Skin: No rashes       Basic Metabolic Panel:  Recent Labs Lab 09/20/17 1950 09/21/17 0451 09/21/17 0942 09/22/17 0456  NA 129* 129*  --  131*  K 3.1* 3.0*  --  4.1  CL 93* 95*  --  97*  CO2 23 28  --  26  GLUCOSE 170* 122*  --  135*  BUN 28* 31*  --  32*  CREATININE 1.90* 1.85*  --  1.89*  CALCIUM 8.6* 8.3*  --  8.4*  MG  --   --  2.0  --     Liver Function Tests:  Recent Labs Lab 09/20/17 1950 09/22/17 0456  AST 198* 142*  ALT 79* 65*  ALKPHOS 186* 164*  BILITOT 15.8* 17.1*  PROT 6.3* 6.2*  ALBUMIN 2.0* 2.7*    Recent Labs Lab  09/20/17 1950  LIPASE 41    Recent Labs Lab 09/20/17 1950  AMMONIA 70*    CBC:  Recent Labs Lab 09/20/17 1950 09/21/17 0451 09/21/17 1927 09/22/17 0456  WBC 13.8* 10.9*  --  7.8  HGB 6.0* 6.3* 7.9* 7.7*  HCT 19.0* 19.5* 24.1* 23.9*  MCV 79.5* 79.8*  --  80.1  PLT 168 137*  --  126*    Cardiac Enzymes: No results for input(s): CKTOTAL, CKMB, CKMBINDEX, TROPONINI in the last 168 hours.  BNP: Invalid input(s): POCBNP  CBG: No results for input(s): GLUCAP in the last 168 hours.  Microbiology: Results for orders placed or performed during the hospital encounter of 02/11/16  CULTURE, BLOOD (ROUTINE X 2) w Reflex to PCR ID Panel     Status: None   Collection Time: 02/18/16  2:30 PM  Result Value Ref Range Status   Specimen Description BLOOD LEFT ANTECUBITAL  Final   Special Requests BOTTLES DRAWN AEROBIC AND ANAEROBIC  [place]  Final   Culture NO GROWTH 5 DAYS  Final   Report Status 02/23/2016 FINAL  Final  CULTURE, BLOOD (ROUTINE X 2) w Reflex to PCR ID Panel     Status: None   Collection Time: 02/18/16  2:30 PM  Result Value Ref Range Status  Specimen Description BLOOD RIGHT ANTECUBITAL  Final   Special Requests BOTTLES DRAWN AEROBIC AND ANAEROBIC  8CC  Final   Culture NO GROWTH 5 DAYS  Final   Report Status 02/23/2016 FINAL  Final    Coagulation Studies:  Recent Labs  09/21/17 1927  LABPROT 20.9*  INR 1.82    Urinalysis:  Recent Labs  09/20/17 1945 09/22/17 0212  COLORURINE ORANGE* AMBER*  LABSPEC 1.018 1.011  PHURINE TEST NOT REPORTED DUE TO COLOR INTERFERENCE OF URINE PIGMENT 5.0  GLUCOSEU TEST NOT REPORTED DUE TO COLOR INTERFERENCE OF URINE PIGMENT* NEGATIVE  HGBUR TEST NOT REPORTED DUE TO COLOR INTERFERENCE OF URINE PIGMENT* NEGATIVE  BILIRUBINUR TEST NOT REPORTED DUE TO COLOR INTERFERENCE OF URINE PIGMENT* SMALL*  KETONESUR TEST NOT REPORTED DUE TO COLOR INTERFERENCE OF URINE PIGMENT* NEGATIVE  PROTEINUR TEST NOT REPORTED DUE TO COLOR  INTERFERENCE OF URINE PIGMENT* NEGATIVE  NITRITE TEST NOT REPORTED DUE TO COLOR INTERFERENCE OF URINE PIGMENT* POSITIVE*  LEUKOCYTESUR TEST NOT REPORTED DUE TO COLOR INTERFERENCE OF URINE PIGMENT* NEGATIVE      Imaging: Dg Chest 2 View  Result Date: 09/20/2017 CLINICAL DATA:  Weakness and dyspnea x3 weeks. Reports multiple falls. EXAM: CHEST  2 VIEW COMPARISON:  02/28/2016 FINDINGS: Borderline cardiomegaly. No aortic aneurysm. Platelike atelectasis and/or scarring at the right lung base with mild elevation of the right hemidiaphragm. No overt pulmonary edema. No acute nor suspicious osseous abnormalities. IMPRESSION: Borderline cardiomegaly.  Right basilar atelectasis and/or scarring. Electronically Signed   By: Ashley Royalty M.D.   On: 09/20/2017 21:07   US Abdomen Limited Ruq  Result Date: 09/21/2017 CLINICAL DATA:  Initial evaluation for elevated LFTs, jaundice. EXAM: ULTRASOUND ABDOMEN LIMITED RIGHT UPPER QUADRANT COMPARISON:  Prior CT from 02/11/2016. FINDINGS: Gallbladder: Gallbladder partially contracted, limiting evaluation. No definite shadowing echogenic stones identified. Gallbladder wall thickness mildly increased up to 3.8 mm, suspected to be related to contraction and ascites. No free pericholecystic fluid. No sonographic Murphy sign elicited on exam. Common bile duct: Diameter: 2.5 mm Liver: Liver demonstrates a diffuse nodular contour with coarse heterogeneous echotexture, consistent with known history of cirrhosis. No focal intrahepatic lesions. Although evaluation of the main portal vein is somewhat limited due to the overlying dense heterogeneous liver, no appreciable flow is visualized within the main portal vein via sonography. Moderate to large volume ascites noted within the partially visualized abdomen. IMPRESSION: 1. Coarse heterogeneous liver with nodular contour, consistent with cirrhosis. 2. Moderate to large volume ascites. 3. No appreciable flow within the main portal vein by  sonography, with portal vein thrombosis not excluded. Please note evaluation is limited on this exam due to the overlying dense echogenic liver. Further evaluation with dedicated cross-sectional imaging could be performed for further evaluation as desired. 4. No acute gallbladder pathology identified. No biliary dilatation. Electronically Signed   By: Jeannine Boga M.D.   On: 09/21/2017 00:34     Medications:   . sodium chloride 50 mL/hr at 09/22/17 1233  . albumin human    . cefTRIAXone (ROCEPHIN)  IV Stopped (09/22/17 1049)  . octreotide  (SANDOSTATIN)    IV infusion 50 mcg/hr (09/22/17 0945)   . folic acid  1 mg Oral Daily  . LORazepam  0-4 mg Oral Q6H   Followed by  . [START ON 09/23/2017] LORazepam  0-4 mg Oral Q12H  . multivitamin with minerals  1 tablet Oral Daily  . pantoprazole (PROTONIX) IV  40 mg Intravenous Q12H  . pentoxifylline  400 mg Oral TID WC  .  thiamine  100 mg Oral Daily   Or  . thiamine  100 mg Intravenous Daily   benzonatate, guaiFENesin-dextromethorphan, LORazepam **OR** LORazepam, ondansetron **OR** ondansetron (ZOFRAN) IV, oxyCODONE  Assessment/ Plan:  66 y.o. male with a PMHx of alcohol abuse, cirrhosis of the liver, gastric varices, hyperbilirubinemia, who was admitted to [facility] on 09/20/2017 for evaluation of weakness and increasing abdominal distention and discomfort.    1. Acute renal failure likely related to GI blood loss, diuretic usage, and use of ibuprofen at home. 2. Hyponatremia. 3. Hypokalemia. 4. GI bleed.  Plan: Renal function currently about the same with a creatinine of 1.89 and BUN of 32. Continue IV fluid hydration with 0.9 normal saline and also continue albumen infusion. Serum sodium has slightly improved. Hypokalemia was also improved significantly with a potassium up to 4.1. Continue to monitor renal function, sodium, and potassium. No indication for dialysis at the moment.   LOS: 2 Daevon Holdren 11/3/20183:10 PM

## 2017-09-22 NOTE — Progress Notes (Signed)
[facility] [place] NAME: Craig Maldonado    MR#:  [id number]  DATE OF BIRTH:  [birth year]  SUBJECTIVE:    patient here due to abdominal pain, increasing abdominal girth and also a recent fall. Also noted to be anemic. Complaining of some left sided Hip pain.   REVIEW OF SYSTEMS:    Review of Systems  Constitutional: Negative for chills and fever.  HENT: Negative for congestion and tinnitus.   Eyes: Negative for blurred vision and double vision.  Respiratory: Negative for cough, shortness of breath and wheezing.   Cardiovascular: Negative for chest pain, orthopnea and PND.  Gastrointestinal: Negative for abdominal pain, diarrhea, nausea and vomiting.  Genitourinary: Negative for dysuria and hematuria.  Musculoskeletal: Positive for joint pain (left hip).  Neurological: Negative for dizziness, sensory change and focal weakness.  All other systems reviewed and are negative.   Nutrition: Clear liquids Tolerating Diet: yes Tolerating PT: Await Eval.   DRUG ALLERGIES:   Allergies  Allergen Reactions  . Codeine Nausea And Vomiting    VITALS:  Blood pressure 103/64, pulse 80, temperature 98 F (36.7 C), temperature source Oral, resp. rate 16, height 5\' 11"  (1.803 m), weight 102.9 kg (226 lb 14.4 oz), SpO2 94 %.  PHYSICAL EXAMINATION:   Physical Exam  GENERAL:  66 y.o.-year-old patient lying in bed in no acute distress.  EYES: Pupils equal, round, reactive to light and accommodation. + scleral icterus. Extraocular muscles intact.  HEENT: Head atraumatic, normocephalic. Oropharynx and nasopharynx clear.  NECK:  Supple, no jugular venous distention. No thyroid enlargement, no tenderness.  LUNGS: Normal breath sounds bilaterally, no wheezing, rales, rhonchi. No use of accessory muscles of respiration.  CARDIOVASCULAR: S1, S2 normal. II/VI SEM at RSB, No rubs, or gallops.  ABDOMEN: Soft, nontender, distended and + fluid wave consistent with  Ascites. Bowel sounds present. No organomegaly or mass.  EXTREMITIES: No cyanosis, clubbing, +1-2 edema b/l NEUROLOGIC: Cranial nerves II through XII are intact. No focal Motor or sensory deficits b/l.  Globally weak PSYCHIATRIC: The patient is alert and oriented x 3.  SKIN: No obvious rash, lesion, or ulcer.    LABORATORY PANEL:   CBC  Recent Labs Lab 09/22/17 0456  WBC 7.8  HGB 7.7*  HCT 23.9*  PLT 126*   ------------------------------------------------------------------------------------------------------------------  Chemistries   Recent Labs Lab 09/21/17 0942 09/22/17 0456  NA  --  131*  K  --  4.1  CL  --  97*  CO2  --  26  GLUCOSE  --  135*  BUN  --  32*  CREATININE  --  1.89*  CALCIUM  --  8.4*  MG 2.0  --   AST  --  142*  ALT  --  65*  ALKPHOS  --  164*  BILITOT  --  17.1*   ------------------------------------------------------------------------------------------------------------------  Cardiac Enzymes No results for input(s): TROPONINI in the last 168 hours. ------------------------------------------------------------------------------------------------------------------  RADIOLOGY:  Dg Chest 2 View  Result Date: 09/20/2017 CLINICAL DATA:  Weakness and dyspnea x3 weeks. Reports multiple falls. EXAM: CHEST  2 VIEW COMPARISON:  02/28/2016 FINDINGS: Borderline cardiomegaly. No aortic aneurysm. Platelike atelectasis and/or scarring at the right lung base with mild elevation of the right hemidiaphragm. No overt pulmonary edema. No acute nor suspicious osseous abnormalities. IMPRESSION: Borderline cardiomegaly.  Right basilar atelectasis and/or scarring. Electronically Signed   By: Ashley Royalty M.D.   On: 09/20/2017 21:07   US Abdomen Limited Ruq  Result Date: 09/21/2017  CLINICAL DATA:  Initial evaluation for elevated LFTs, jaundice. EXAM: ULTRASOUND ABDOMEN LIMITED RIGHT UPPER QUADRANT COMPARISON:  Prior CT from 02/11/2016. FINDINGS: Gallbladder:  Gallbladder partially contracted, limiting evaluation. No definite shadowing echogenic stones identified. Gallbladder wall thickness mildly increased up to 3.8 mm, suspected to be related to contraction and ascites. No free pericholecystic fluid. No sonographic Murphy sign elicited on exam. Common bile duct: Diameter: 2.5 mm Liver: Liver demonstrates a diffuse nodular contour with coarse heterogeneous echotexture, consistent with known history of cirrhosis. No focal intrahepatic lesions. Although evaluation of the main portal vein is somewhat limited due to the overlying dense heterogeneous liver, no appreciable flow is visualized within the main portal vein via sonography. Moderate to large volume ascites noted within the partially visualized abdomen. IMPRESSION: 1. Coarse heterogeneous liver with nodular contour, consistent with cirrhosis. 2. Moderate to large volume ascites. 3. No appreciable flow within the main portal vein by sonography, with portal vein thrombosis not excluded. Please note evaluation is limited on this exam due to the overlying dense echogenic liver. Further evaluation with dedicated cross-sectional imaging could be performed for further evaluation as desired. 4. No acute gallbladder pathology identified. No biliary dilatation. Electronically Signed   By: Jeannine Boga M.D.   On: 09/21/2017 00:34     ASSESSMENT AND PLAN:   66 year old male with past medical history of alcohol abuse, alcoholic liver cirrhosis who presented to the hospital after a fall and also increasing abdominal girth and pain.  1. Symptomatic anemia-patient presented to the hospital with a hemoglobin of 6.3. -Suspected to be secondary to an upper GI bleed given history of alcohol abuse and varices. Patient denies any hematemesis but is made to some melena. -Patient has been transfused 2 units of packed blood cells and hemoglobin currently stable. -Appreciate gastroenterology input and continue IV Protonix,  octreotide drip for now. Patient will likely need a endoscopy soon.  2. GI bleed-suspected to be an upper GI bleed given patient's melanotic stools. -Given patient's history of alcohol abuse and liver cirrhosis suspected to be either gastritis or esophageal varices related. Patient denies any hematemesis. -Continue IV Protonix, octreotide drip. Await further gastroenterology input. Patient likely to need upper GI endoscopy soon. Follow serial CBCs. -Continue clear liquid diet for now.  3. Liver cirrhosis with ascites-patient has significant abdominal distention and CT scan was suggestive of moderate to large volume ascites. -Plan for ultrasound-guided paracentesis likely on Monday. Continue supportive care for now continue empiric Rocephin. Continue diuretics.  4. Alcohol abuse-continue CIWA protocol. No clinical evidence of withdrawal presently.  5. Acute kidney injury-appreciate nephrology input and this is likely secondary to diuretic use, GI blood loss and also use of ibuprofen at home. -Continue to hold diuretics for now. Follow BUN and creatinine and urine output. Renal dose meds, avoid nephrotoxins. - cont. Albumin as per Nephro.   6. hypokalemia-improved with supplementation will continue to monitor.  7. Hyponatremia-secondary to alcohol abuse and liver disease. Clinically asymptomatic and will continue to monitor.  Patient's prognosis is poor to guarded given his advanced alcoholic liver disease.  All the records are reviewed and case discussed with Care Management/Social Worker. Management plans discussed with the patient, family and they are in agreement.  CODE STATUS: Full code  DVT Prophylaxis: Teds and SCDs  TOTAL TIME TAKING CARE OF THIS PATIENT: 30 minutes.   POSSIBLE D/C IN 2-3 DAYS, DEPENDING ON CLINICAL CONDITION.   Henreitta Leber M.D on 09/22/2017 at 12:01 PM  Between 7am to 6pm - Pager -  [phone]  After 6pm go to [email or web address] - Clinical research associate [facility]  Office  [phone]  CC: Primary care physician; Lucilla Lame, MD

## 2017-09-23 ENCOUNTER — Inpatient Hospital Stay: Payer: Medicare Other | Admitting: Anesthesiology

## 2017-09-23 ENCOUNTER — Encounter: Payer: Self-pay | Admitting: Anesthesiology

## 2017-09-23 ENCOUNTER — Encounter
Admission: EM | Disposition: A | Discharge status: Home Health | Payer: Self-pay | Source: Home / Self Care | Attending: Specialist

## 2017-09-23 ENCOUNTER — Inpatient Hospital Stay: Payer: Medicare Other

## 2017-09-23 ENCOUNTER — Other Ambulatory Visit: Payer: Self-pay

## 2017-09-23 HISTORY — PX: ESOPHAGOGASTRODUODENOSCOPY (EGD) WITH PROPOFOL: SHX5813

## 2017-09-23 LAB — COMPREHENSIVE METABOLIC PANEL
ALBUMIN: 3.2 g/dL — AB (ref 3.5–5.0)
ALT: 54 U/L (ref 17–63)
AST: 122 U/L — ABNORMAL HIGH (ref 15–41)
Alkaline Phosphatase: 126 U/L (ref 38–126)
Anion gap: 7 (ref 5–15)
BILIRUBIN TOTAL: 15.8 mg/dL — AB (ref 0.3–1.2)
BUN: 28 mg/dL — ABNORMAL HIGH (ref 6–20)
CO2: 26 mmol/L (ref 22–32)
Calcium: 8.3 mg/dL — ABNORMAL LOW (ref 8.9–10.3)
Chloride: 98 mmol/L — ABNORMAL LOW (ref 101–111)
Creatinine, Ser: 1.43 mg/dL — ABNORMAL HIGH (ref 0.61–1.24)
GFR calc non Af Amer: 50 mL/min — ABNORMAL LOW (ref >60)
GFR, EST AFRICAN AMERICAN: 58 mL/min — AB (ref >60)
GLUCOSE: 126 mg/dL — AB (ref 65–99)
POTASSIUM: 3.9 mmol/L (ref 3.5–5.1)
SODIUM: 131 mmol/L — AB (ref 135–145)
TOTAL PROTEIN: 6.2 g/dL — AB (ref 6.5–8.1)

## 2017-09-23 LAB — TYPE AND SCREEN
ABO/RH(D): O POS
Antibody Screen: NEGATIVE
UNIT DIVISION: 0
UNIT DIVISION: 0
Unit division: 0

## 2017-09-23 LAB — CBC
HCT: 23.7 % — ABNORMAL LOW (ref 40.0–52.0)
Hemoglobin: 7.5 g/dL — ABNORMAL LOW (ref 13.0–18.0)
MCH: 25.6 pg — AB (ref 26.0–34.0)
MCHC: 31.5 g/dL — AB (ref 32.0–36.0)
MCV: 81.1 fL (ref 80.0–100.0)
Platelets: 116 10*3/uL — ABNORMAL LOW (ref 150–440)
RBC: 2.92 MIL/uL — ABNORMAL LOW (ref 4.40–5.90)
RDW: 24.2 % — AB (ref 11.5–14.5)
WBC: 6.6 10*3/uL (ref 3.8–10.6)

## 2017-09-23 LAB — BPAM RBC
BLOOD PRODUCT EXPIRATION DATE: 201811302359
Blood Product Expiration Date: 201811302359
Blood Product Expiration Date: 201811302359
ISSUE DATE / TIME: 201811020131
ISSUE DATE / TIME: 201811021017
ISSUE DATE / TIME: 201811021343
UNIT TYPE AND RH: 5100
Unit Type and Rh: 5100
Unit Type and Rh: 5100

## 2017-09-23 SURGERY — ESOPHAGOGASTRODUODENOSCOPY (EGD) WITH PROPOFOL
Anesthesia: General

## 2017-09-23 MED ORDER — PROPOFOL 10 MG/ML IV BOLUS
INTRAVENOUS | Status: DC | PRN
  Administered 2017-09-23: 50 mg via INTRAVENOUS
  Administered 2017-09-23: 100 mg via INTRAVENOUS
  Administered 2017-09-23: 50 mg via INTRAVENOUS

## 2017-09-23 MED ORDER — PHENYLEPHRINE HCL 10 MG/ML IJ SOLN
INTRAMUSCULAR | Status: DC | PRN
  Administered 2017-09-23: 200 ug via INTRAVENOUS

## 2017-09-23 MED ORDER — LIDOCAINE HCL (CARDIAC) 20 MG/ML IV SOLN
INTRAVENOUS | Status: DC | PRN
  Administered 2017-09-23: 100 mg via INTRAVENOUS

## 2017-09-23 MED ORDER — FUROSEMIDE 10 MG/ML IJ SOLN
40.0000 mg | Freq: Once | INTRAMUSCULAR | Status: AC
  Administered 2017-09-24: 40 mg via INTRAVENOUS
  Filled 2017-09-23: qty 4

## 2017-09-23 MED ORDER — LIDOCAINE HCL (PF) 2 % IJ SOLN
INTRAMUSCULAR | Status: AC
  Filled 2017-09-23: qty 10

## 2017-09-23 MED ORDER — PHENYLEPHRINE HCL 10 MG/ML IJ SOLN
INTRAMUSCULAR | Status: AC
  Filled 2017-09-23: qty 1

## 2017-09-23 MED ORDER — ASPIRIN 81 MG PO CHEW
CHEWABLE_TABLET | ORAL | Status: AC
  Filled 2017-09-23: qty 1

## 2017-09-23 MED ORDER — PROPOFOL 10 MG/ML IV BOLUS
INTRAVENOUS | Status: AC
  Filled 2017-09-23: qty 20

## 2017-09-23 MED ORDER — HYDROCOD POLST-CPM POLST ER 10-8 MG/5ML PO SUER
5.0000 mL | Freq: Two times a day (BID) | ORAL | Status: DC | PRN
  Administered 2017-09-23 – 2017-09-24 (×2): 5 mL via ORAL
  Filled 2017-09-23 (×2): qty 5

## 2017-09-23 NOTE — Progress Notes (Signed)
Came to see patient twice however he was off the floor.  Renal function improving.  Creatinine down to 1.43.  Continue IV fluids as well as albumin for now.

## 2017-09-23 NOTE — Progress Notes (Signed)
[facility] NAME: Craig Maldonado    MR#:  [id number]  DATE OF BIRTH:  23-Jul-1951  SUBJECTIVE:   status post upper GI endoscopy showing some gastritis and a duodenal ulcer which was treated. Hemoglobin remained stable posttransfusion. No other acute events overnight.   REVIEW OF SYSTEMS:    Review of Systems  Constitutional: Negative for chills and fever.  HENT: Negative for congestion and tinnitus.   Eyes: Negative for blurred vision and double vision.  Respiratory: Negative for cough, shortness of breath and wheezing.   Cardiovascular: Negative for chest pain, orthopnea and PND.  Gastrointestinal: Negative for abdominal pain, diarrhea, nausea and vomiting.  Genitourinary: Negative for dysuria and hematuria.  Musculoskeletal: Positive for joint pain (left hip).  Neurological: Negative for dizziness, sensory change and focal weakness.  All other systems reviewed and are negative.   Nutrition: Clear liquids Tolerating Diet: yes Tolerating PT: Await Eval.   DRUG ALLERGIES:   Allergies  Allergen Reactions  . Codeine Nausea And Vomiting    VITALS:  Blood pressure (!) 97/57, pulse 93, temperature 97.7 F (36.5 C), temperature source Oral, resp. rate (!) 21, height 5\' 11"  (1.803 m), weight 102.9 kg (226 lb 12.8 oz), SpO2 93 %.  PHYSICAL EXAMINATION:   Physical Exam  GENERAL:  66 y.o.-year-old patient lying in bed in no acute distress.  EYES: Pupils equal, round, reactive to light and accommodation. + scleral icterus. Extraocular muscles intact.  HEENT: Head atraumatic, normocephalic. Oropharynx and nasopharynx clear.  NECK:  Supple, no jugular venous distention. No thyroid enlargement, no tenderness.  LUNGS: Normal breath sounds bilaterally, no wheezing, rales, rhonchi. No use of accessory muscles of respiration.  CARDIOVASCULAR: S1, S2 normal. II/VI SEM at RSB, No rubs, or gallops.  ABDOMEN: Soft, nontender, distended and +  fluid wave consistent with Ascites. Bowel sounds present. No organomegaly or mass.  EXTREMITIES: No cyanosis, clubbing, +1-2 edema b/l NEUROLOGIC: Cranial nerves II through XII are intact. No focal Motor or sensory deficits b/l.  Globally weak PSYCHIATRIC: The patient is alert and oriented x 3.  SKIN: No obvious rash, lesion, or ulcer.    LABORATORY PANEL:   CBC Recent Labs  Lab 09/23/17 0527  WBC 6.6  HGB 7.5*  HCT 23.7*  PLT 116*   ------------------------------------------------------------------------------------------------------------------  Chemistries  Recent Labs  Lab 09/21/17 0942  09/23/17 0527  NA  --    < > 131*  K  --    < > 3.9  CL  --    < > 98*  CO2  --    < > 26  GLUCOSE  --    < > 126*  BUN  --    < > 28*  CREATININE  --    < > 1.43*  CALCIUM  --    < > 8.3*  MG 2.0  --   --   AST  --    < > 122*  ALT  --    < > 54  ALKPHOS  --    < > 126  BILITOT  --    < > 15.8*   < > = values in this interval not displayed.   ------------------------------------------------------------------------------------------------------------------  Cardiac Enzymes No results for input(s): TROPONINI in the last 168 hours. ------------------------------------------------------------------------------------------------------------------  RADIOLOGY:  No results found.   ASSESSMENT AND PLAN:   66 year old male with past medical history of alcohol abuse, alcoholic liver cirrhosis who presented to the hospital after a fall  and also increasing abdominal girth and pain.  1. Symptomatic anemia-patient presented to the hospital with a hemoglobin of 6.3. -Suspected to be secondary to an upper GI bleed given history of alcohol abuse and varices. Patient denies any hematemesis but did complain of some Melena.  -Patient has been transfused 2 units of packed blood cells and Hg. Stable at 7.5.   -Appreciate gastroenterology input and continue IV Protonix, octreotide drip for now.    2. GI bleed-suspected to be an upper GI bleed given patient's melanotic stools. -Status post upper GI endoscopy showing gastritis and a duodenal ulcer which was treated. Hemoglobin stable. Continue IV Protonix, octreotide drip. Continue clear liquid diet, follow serial hemoglobin.  3. Liver cirrhosis with ascites-patient has significant abdominal distention and CT scan was suggestive of moderate to large volume ascites. -Plan for ultrasound-guided paracentesis likely on Monday. Continue supportive care for now continue empiric Rocephin. Continue diuretics.  4. Alcohol abuse-continue CIWA protocol. No clinical evidence of withdrawal presently.  5. Acute kidney injury-appreciate nephrology input and this is likely secondary to diuretic use, GI blood loss and also use of ibuprofen at home. -Continue to hold diuretics for now. Continue gentle IV fluids, albumin, creatinine improving. Appreciate nephrology input.  6. hypokalemia-improved with supplementation will continue to monitor.  7. Hyponatremia-secondary to alcohol abuse and liver disease. Clinically asymptomatic and will continue to monitor.  Patient's prognosis is poor to guarded given his advanced alcoholic liver disease.   All the records are reviewed and case discussed with Care Management/Social Worker. Management plans discussed with the patient, family and they are in agreement.  CODE STATUS: Full code  DVT Prophylaxis: Teds and SCDs  TOTAL TIME TAKING CARE OF THIS PATIENT: 30 minutes.   POSSIBLE D/C IN 2-3 DAYS, DEPENDING ON CLINICAL CONDITION.   Henreitta Leber M.D on 09/23/2017 at 1:20 PM  Between 7am to 6pm - Pager - [phone]  After 6pm go to [email or web address] - [employer] [facility]  Office  [phone]  CC: Primary care physician; Lucilla Lame, MD

## 2017-09-23 NOTE — Anesthesia Preprocedure Evaluation (Signed)
Anesthesia Evaluation  Patient identified by MRN, date of birth, ID band Patient awake    Reviewed: Allergy & Precautions, NPO status , Patient's Chart, lab work & pertinent test results  History of Anesthesia Complications Negative for: history of anesthetic complications  Airway Mallampati: II       Dental  (+) Edentulous Upper, Upper Dentures   Pulmonary Current Smoker,           Cardiovascular hypertension, + Valvular Problems/Murmurs      Neuro/Psych negative neurological ROS  negative psych ROS   GI/Hepatic negative GI ROS, Neg liver ROS, PUD, (+) Cirrhosis   Esophageal Varices  substance abuse  alcohol use,   Endo/Other  negative endocrine ROS  Renal/GU Renal diseasenegative Renal ROS     Musculoskeletal   Abdominal   Peds  Hematology negative hematology ROS (+)   Anesthesia Other Findings   Reproductive/Obstetrics                             Anesthesia Physical  Anesthesia Plan  ASA: III and emergent  Anesthesia Plan: General   Post-op Pain Management:    Induction: Intravenous  PONV Risk Score and Plan: 0  Airway Management Planned: Nasal Cannula  Additional Equipment:   Intra-op Plan:   Post-operative Plan:   Informed Consent: I have reviewed the patients History and Physical, chart, labs and discussed the procedure including the risks, benefits and alternatives for the proposed anesthesia with the patient or authorized representative who has indicated his/her understanding and acceptance.     Plan Discussed with:   Anesthesia Plan Comments:         Anesthesia Quick Evaluation

## 2017-09-23 NOTE — Progress Notes (Signed)
Dr Jannifer Franklin notified of pt,s c/o shortness of breath. Orders given.

## 2017-09-23 NOTE — Op Note (Signed)
[facility] Gastroenterology Patient Name: Craig Maldonado Procedure Date: 09/23/2017 10:36 AM MRN: [id number] Account #: [id number] Date of Birth: 1951/05/12 Admit Type: Inpatient Age: 66 Room: [facility] ENDO ROOM 4 Gender: Male Note Status: Finalized Procedure:            Upper GI endoscopy Indications:          Recent gastrointestinal bleeding, Melena Providers:            Benay Pike. [place] MD, MD Medicines:            Propofol per Anesthesia Complications:        No immediate complications. Procedure:            Pre-Anesthesia Assessment:                       - The risks and benefits of the procedure and the                        sedation options and risks were discussed with the                        patient. All questions were answered and informed                        consent was obtained.                       - Patient identification and proposed procedure were                        verified prior to the procedure by the nurse. The                        procedure was verified in the procedure room.                       - ASA Grade Assessment: III - A patient with severe                        systemic disease.                       - After reviewing the risks and benefits, the patient                        was deemed in satisfactory condition to undergo the                        procedure.                       After obtaining informed consent, the endoscope was                        passed under direct vision. Throughout the procedure,                        the patient's blood pressure, pulse, and oxygen                        saturations were monitored continuously. The  Colonoscope was introduced through the mouth, and                        advanced to the third part of duodenum. The upper GI                        endoscopy was accomplished without difficulty. The                        patient tolerated the procedure well.  The upper GI                        endoscopy was accomplished without difficulty. The                        patient tolerated the procedure well. Findings:      LA Grade A (one or more mucosal breaks less than 5 mm, not extending       between tops of 2 mucosal folds) esophagitis with no bleeding was found       37 cm from the incisors. No biopsies or other specimens were collected       for this exam.      Localized mild inflammation characterized by congestion (edema),       erosions and erythema was found in the gastric antrum. Biopsies were       taken with a cold forceps for Helicobacter pylori testing.      Diffuse moderate mucosal changes characterized by erythema, erosion and       petechiae were found in the entire duodenum.      One oozing superficial duodenal ulcer with a visible vessel was found in       the first portion of the duodenum. The lesion was 5 mm in largest       dimension. Area was successfully injected with 1 mL of a 1:10,000       solution of epinephrine for hemostasis. Coagulation for hemostasis using       bipolar probe was successful. Estimated blood loss was minimal. Impression:           - LA Grade A reflux esophagitis. No specimens collected.                       - Chronic gastritis. Biopsied.                       - Mucosal changes in the duodenum.                       - One oozing duodenal ulcer with a visible vessel.                        Injected. Treated with bipolar cautery. Recommendation:       - Return patient to hospital ward for ongoing care.                       - Clear liquid diet.                       - Await pathology results.                       -  Continue present medications. Procedure Code(s):    --- Professional ---                       [phone], 74, Esophagogastroduodenoscopy, flexible,                        transoral; with control of bleeding, any method                       43239, Esophagogastroduodenoscopy, flexible,  transoral;                        with biopsy, single or multiple Diagnosis Code(s):    --- Professional ---                       K21.0, Gastro-esophageal reflux disease with esophagitis                       K29.50, Unspecified chronic gastritis without bleeding                       K26.4, Chronic or unspecified duodenal ulcer with                        hemorrhage                       K31.89, Other diseases of stomach and duodenum                       K92.2, Gastrointestinal hemorrhage, unspecified                       K92.1, Melena (includes Hematochezia) CPT copyright 2016 American Medical Association. All rights reserved. The codes documented in this report are preliminary and upon coder review may  be revised to meet current compliance requirements. Efrain Sella MD, MD 09/23/2017 11:16:45 AM This report has been signed electronically. Number of Addenda: 0 Note Initiated On: 09/23/2017 10:36 AM      [facility]

## 2017-09-23 NOTE — Progress Notes (Signed)
MEDICATION RELATED CONSULT NOTE - INITIAL   Pharmacy Consult for Electrolyte management Indication: hypokalemia  Allergies  Allergen Reactions  . Codeine Nausea And Vomiting    Patient Measurements: Height: 5\' 11"  (180.3 cm) Weight: 226 lb 12.8 oz (102.9 kg) IBW/kg (Calculated) : 75.3 Adjusted Body Weight:   Vital Signs: Temp: 97.9 F (36.6 C) (11/04 0416) Temp Source: Oral (11/04 0416) BP: 101/59 (11/04 0416) Pulse Rate: 76 (11/04 0416) Intake/Output from previous day: 11/03 0701 - 11/04 0700 In: 1497.5 [P.O.:100; I.V.:1147.5; IV Piggyback:250] Out: 1175 [Urine:1175] Intake/Output from this shift: No intake/output data recorded.  Labs: Recent Labs    09/20/17 1950 09/21/17 0451 09/21/17 0942 09/21/17 1927 09/22/17 0456 09/23/17 0527  WBC 13.8* 10.9*  --   --  7.8 6.6  HGB 6.0* 6.3*  --  7.9* 7.7* 7.5*  HCT 19.0* 19.5*  --  24.1* 23.9* 23.7*  PLT 168 137*  --   --  126* 116*  CREATININE 1.90* 1.85*  --   --  1.89* 1.43*  MG  --   --  2.0  --   --   --   ALBUMIN 2.0*  --   --   --  2.7* 3.2*  PROT 6.3*  --   --   --  6.2* 6.2*  AST 198*  --   --   --  142* 122*  ALT 79*  --   --   --  65* 54  ALKPHOS 186*  --   --   --  164* 126  BILITOT 15.8*  --   --   --  17.1* 15.8*  BILIDIR 8.5*  --   --   --   --   --   IBILI 7.3*  --   --   --   --   --    Estimated Creatinine Clearance: 63.7 mL/min (A) (by C-G formula based on SCr of 1.43 mg/dL (H)).   Microbiology: No results found for this or any previous visit (from the past 720 hour(s)).  Medical History: Past Medical History:  Diagnosis Date  . Alcohol abuse   . Alcoholic cirrhosis ([place])   . Gastric varices     Assessment: Pharmacy consulted to manage electrolytes in this 66 year old woman. Pt admitted w/ GI blees, liver cirrhosis with ascities, etoh abuse.  K =3.9 this AM   Goal of Therapy:  K= 3.5-5  Plan:  No replacement needed. K has been stable x 2 days. Pharmacy to sign off  Ramond Dial, Pharm.D, BCPS Clinical Pharmacist  09/23/2017,7:20 AM

## 2017-09-23 NOTE — Transfer of Care (Signed)
Immediate Anesthesia Transfer of Care Note  Patient: Craig Maldonado  Procedure(s) Performed: ESOPHAGOGASTRODUODENOSCOPY (EGD) WITH PROPOFOL (N/A )  Patient Location: PACU and Endoscopy Unit  Anesthesia Type:General  Level of Consciousness: sedated  Airway & Oxygen Therapy: Patient Spontanous Breathing and Patient connected to nasal cannula oxygen  Post-op Assessment: Report given to RN and Post -op Vital signs reviewed and stable  Post vital signs: Reviewed and stable  Last Vitals:  Vitals:   09/23/17 0416 09/23/17 1041  BP: (!) 101/59 101/67  Pulse: 76 77  Resp: 18 20  Temp: 36.6 C 37.1 C  SpO2: 97% 94%    Last Pain:  Vitals:   09/23/17 1041  TempSrc: Tympanic  PainSc:       Patients Stated Pain Goal: 0 (03/83/33 8329)  Complications: No apparent anesthesia complications

## 2017-09-23 NOTE — Anesthesia Postprocedure Evaluation (Signed)
Anesthesia Post Note  Patient: Craig Maldonado  Procedure(s) Performed: ESOPHAGOGASTRODUODENOSCOPY (EGD) WITH PROPOFOL (N/A )  Patient location during evaluation: PACU Anesthesia Type: General Level of consciousness: awake and alert and oriented Pain management: pain level controlled Vital Signs Assessment: post-procedure vital signs reviewed and stable Respiratory status: spontaneous breathing Cardiovascular status: blood pressure returned to baseline Anesthetic complications: no     Last Vitals:  Vitals:   09/23/17 1132 09/23/17 1142  BP: 101/66 105/70  Pulse: 81 78  Resp: 16 16  Temp:    SpO2:      Last Pain:  Vitals:   09/23/17 1112  TempSrc: Tympanic  PainSc:                  Talin Feister

## 2017-09-23 NOTE — Anesthesia Post-op Follow-up Note (Signed)
Anesthesia QCDR form completed.        

## 2017-09-24 ENCOUNTER — Encounter: Payer: Self-pay | Admitting: Internal Medicine

## 2017-09-24 ENCOUNTER — Inpatient Hospital Stay: Payer: Medicare Other

## 2017-09-24 LAB — COMPREHENSIVE METABOLIC PANEL
ALT: 57 U/L (ref 17–63)
AST: 143 U/L — AB (ref 15–41)
Albumin: 3.4 g/dL — ABNORMAL LOW (ref 3.5–5.0)
Alkaline Phosphatase: 108 U/L (ref 38–126)
Anion gap: 8 (ref 5–15)
BILIRUBIN TOTAL: 19.6 mg/dL — AB (ref 0.3–1.2)
BUN: 20 mg/dL (ref 6–20)
CO2: 26 mmol/L (ref 22–32)
CREATININE: 1.02 mg/dL (ref 0.61–1.24)
Calcium: 8.3 mg/dL — ABNORMAL LOW (ref 8.9–10.3)
Chloride: 101 mmol/L (ref 101–111)
GFR calc Af Amer: >60 mL/min (ref >60)
Glucose, Bld: 113 mg/dL — ABNORMAL HIGH (ref 65–99)
POTASSIUM: 3.7 mmol/L (ref 3.5–5.1)
Sodium: 135 mmol/L (ref 135–145)
TOTAL PROTEIN: 6.6 g/dL (ref 6.5–8.1)

## 2017-09-24 LAB — CBC
HCT: 25.7 % — ABNORMAL LOW (ref 40.0–52.0)
Hemoglobin: 8.1 g/dL — ABNORMAL LOW (ref 13.0–18.0)
MCH: 26 pg (ref 26.0–34.0)
MCHC: 31.5 g/dL — ABNORMAL LOW (ref 32.0–36.0)
MCV: 82.5 fL (ref 80.0–100.0)
Platelets: 122 K/uL — ABNORMAL LOW (ref 150–440)
RBC: 3.11 MIL/uL — ABNORMAL LOW (ref 4.40–5.90)
RDW: 24.8 % — ABNORMAL HIGH (ref 11.5–14.5)
WBC: 8.5 K/uL (ref 3.8–10.6)

## 2017-09-24 MED ORDER — GUAIFENESIN-DM 100-10 MG/5ML PO SYRP
5.0000 mL | ORAL_SOLUTION | ORAL | Status: DC | PRN
  Administered 2017-09-25: 5 mL via ORAL
  Filled 2017-09-24: qty 5

## 2017-09-24 MED ORDER — HYDROCOD POLST-CPM POLST ER 10-8 MG/5ML PO SUER
5.0000 mL | Freq: Two times a day (BID) | ORAL | Status: DC
  Administered 2017-09-24 – 2017-09-26 (×4): 5 mL via ORAL
  Filled 2017-09-24 (×4): qty 5

## 2017-09-24 MED ORDER — PANTOPRAZOLE SODIUM 40 MG PO TBEC
40.0000 mg | DELAYED_RELEASE_TABLET | Freq: Two times a day (BID) | ORAL | Status: DC
  Administered 2017-09-24 – 2017-09-26 (×4): 40 mg via ORAL
  Filled 2017-09-24 (×4): qty 1

## 2017-09-24 MED ORDER — BENZONATATE 100 MG PO CAPS
200.0000 mg | ORAL_CAPSULE | Freq: Two times a day (BID) | ORAL | Status: DC
  Administered 2017-09-24 – 2017-09-26 (×4): 200 mg via ORAL
  Filled 2017-09-24 (×4): qty 2

## 2017-09-24 NOTE — Plan of Care (Signed)
Pt had a BM today

## 2017-09-24 NOTE — Research (Signed)
Per notes in PTA section: patient unable to recall diuretic he currently takes. Technician will contact patient's pharmacy when it opens at 0900 ans will perform full med rec with patient once that information has been obtained.

## 2017-09-24 NOTE — Progress Notes (Signed)
Subjective: Patient seen for f/u duodenal ulcer bleed s/p EGD with endoscopic hemostasis 09/23/17. Patient sleeping comfortably. No recurrent bleed per RN. S/P paracentesis this AM with 2.6 L removed. Studies pending.  Objective: Vital signs in last 24 hours: Temp:  [98 F (36.7 C)-98.2 F (36.8 C)] 98.2 F (36.8 C) (11/05 1158) Pulse Rate:  [78-93] 79 (11/05 1158) Resp:  [20] 20 (11/05 1028) BP: (110-152)/(67-103) 152/103 (11/05 1158) SpO2:  [91 %-95 %] 95 % (11/05 1158) Blood pressure (!) 152/103, pulse 79, temperature 98.2 F (36.8 C), temperature source Oral, resp. rate 20, height 5\' 11"  (1.803 m), weight 102.9 kg (226 lb 12.8 oz), SpO2 95 %.   Intake/Output from previous day: 11/04 0701 - 11/05 0700 In: 677 [I.V.:677] Out: 3350 [Urine:3350]  Intake/Output this shift: Total I/O In: -  Out: 300 [Urine:300]   General appearance:  Arousable, NAD Resp:  CTA Cardio: RR, nl S1, S2 GI:  Soft, BS+ mod distended. +Fluid wave. Extremities:  1+ edema.   Lab Results: Results for orders placed or performed during the hospital encounter of 09/20/17 (from the past 24 hour(s))  Comprehensive metabolic panel     Status: Abnormal   Collection Time: 09/24/17  5:09 AM  Result Value Ref Range   Sodium 135 135 - 145 mmol/L   Potassium 3.7 3.5 - 5.1 mmol/L   Chloride 101 101 - 111 mmol/L   CO2 26 22 - 32 mmol/L   Glucose, Bld 113 (H) 65 - 99 mg/dL   BUN 20 6 - 20 mg/dL   Creatinine, Ser 1.02 0.61 - 1.24 mg/dL   Calcium 8.3 (L) 8.9 - 10.3 mg/dL   Total Protein 6.6 6.5 - 8.1 g/dL   Albumin 3.4 (L) 3.5 - 5.0 g/dL   AST 143 (H) 15 - 41 U/L   ALT 57 17 - 63 U/L   Alkaline Phosphatase 108 38 - 126 U/L   Total Bilirubin 19.6 (HH) 0.3 - 1.2 mg/dL   GFR calc non Af Amer >60 >60 mL/min   GFR calc Af Amer >60 >60 mL/min   Anion gap 8 5 - 15  CBC     Status: Abnormal   Collection Time: 09/24/17  5:09 AM  Result Value Ref Range   WBC 8.5 3.8 - 10.6 K/uL   RBC 3.11 (L) 4.40 - 5.90 MIL/uL    Hemoglobin 8.1 (L) 13.0 - 18.0 g/dL   HCT 25.7 (L) 40.0 - 52.0 %   MCV 82.5 80.0 - 100.0 fL   MCH 26.0 26.0 - 34.0 pg   MCHC 31.5 (L) 32.0 - 36.0 g/dL   RDW 24.8 (H) 11.5 - 14.5 %   Platelets 122 (L) 150 - 440 K/uL     Recent Labs    09/22/17 0456 09/23/17 0527 09/24/17 0509  WBC 7.8 6.6 8.5  HGB 7.7* 7.5* 8.1*  HCT 23.9* 23.7* 25.7*  PLT 126* 116* 122*   BMET Recent Labs    09/22/17 0456 09/23/17 0527 09/24/17 0509  NA 131* 131* 135  K 4.1 3.9 3.7  CL 97* 98* 101  CO2 26 26 26   GLUCOSE 135* 126* 113*  BUN 32* 28* 20  CREATININE 1.89* 1.43* 1.02  CALCIUM 8.4* 8.3* 8.3*   LFT Recent Labs    09/24/17 0509  PROT 6.6  ALBUMIN 3.4*  AST 143*  ALT 57  ALKPHOS 108  BILITOT 19.6*   PT/INR Recent Labs    09/21/17 1927  LABPROT 20.9*  INR 1.82   Hepatitis Panel  No results for input(s): HEPBSAG, HCVAB, HEPAIGM, HEPBIGM in the last 72 hours. C-Diff No results for input(s): CDIFFTOX in the last 72 hours. No results for input(s): CDIFFPCR in the last 72 hours.   Studies/Results: US Renal  Result Date: 09/24/2017 CLINICAL DATA:  Acute renal failure. History of alcoholic cirrhosis, current smoker. EXAM: RENAL / URINARY TRACT ULTRASOUND COMPLETE COMPARISON:  Abdominal and pelvic CT scan of February 11, 2016 FINDINGS: Right Kidney: Length: 12.2 cm. The renal cortical echotexture remains lower than that of the liver. There is no hydronephrosis. Left Kidney: Length: 12.6 cm. The renal cortical echotexture is similar to that on the right. There is no hydronephrosis. Bladder: Appears normal for degree of bladder distention. IMPRESSION: Somewhat limited study due to the patient's body habitus. There is no hydronephrosis. The renal cortical echotexture remains lower than that of the liver. Electronically Signed   By: David  Martinique M.D.   On: 09/24/2017 10:54   US Paracentesis  Result Date: 09/24/2017 INDICATION: Recurrent ascites EXAM: ULTRASOUND GUIDED  PARACENTESIS  MEDICATIONS: None. COMPLICATIONS: None immediate. PROCEDURE: Informed written consent was obtained from the patient after a discussion of the risks, benefits and alternatives to treatment. A timeout was performed prior to the initiation of the procedure. Initial ultrasound scanning demonstrates a large amount of ascites within the right lower abdominal quadrant. The right lower abdomen was prepped and draped in the usual sterile fashion. 1% lidocaine was used for local anesthesia. Following this, a 6 Fr Safe-T-Centesis catheter was introduced. An ultrasound image was saved for documentation purposes. The paracentesis was performed. The catheter was removed and a dressing was applied. The patient tolerated the procedure well without immediate post procedural complication. FINDINGS: A total of approximately 2.6 L of bilirubin stained fluid was removed. Samples were sent to the laboratory as requested by the clinical team. IMPRESSION: Successful ultrasound-guided paracentesis yielding 2.6 liters of peritoneal fluid. Electronically Signed   By: Inez Catalina M.D.   On: 09/24/2017 10:52   Dg Chest Port 1 View  Result Date: 09/23/2017 CLINICAL DATA:  Cough EXAM: PORTABLE CHEST 1 VIEW COMPARISON:  09/20/2017 FINDINGS: Mild cardiomegaly. Aortic atherosclerosis. Elevation of the right diaphragm. Increasing bibasilar opacity. Small right effusion. No pneumothorax. IMPRESSION: 1. Increasing bibasilar streaky opacity may reflect worsening atelectasis or small infiltrates. 2. Small right effusion 3. Borderline to mild cardiomegaly Electronically Signed   By: Donavan Foil M.D.   On: 09/23/2017 23:58    Scheduled Inpatient Medications:   . benzonatate  200 mg Oral BID  . chlorpheniramine-HYDROcodone  5 mL Oral Q12H  . folic acid  1 mg Oral Daily  . LORazepam  0-4 mg Oral Q12H  . multivitamin with minerals  1 tablet Oral Daily  . pantoprazole  40 mg Oral BID  . pentoxifylline  400 mg Oral TID WC  . thiamine  100 mg  Oral Daily   Or  . thiamine  100 mg Intravenous Daily    Continuous Inpatient Infusions:   . albumin human    . cefTRIAXone (ROCEPHIN)  IV Stopped (09/24/17 1232)    PRN Inpatient Medications:  guaiFENesin-dextromethorphan, ondansetron **OR** ondansetron (ZOFRAN) IV, oxyCODONE  Miscellaneous:  Ascitic fluid studies pending.  Assessment:  1. Ascites  2. 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  3. Alcoholism.  4. Alcoholic hepatitis - Maddrey discriminant function is around 58. On pentoxifylline 400mg  po tid. Will require this for next several weeks.   Plan:  1. Continue acid suppression.  2. May advance diet.  2 g NA diet.  3. Following. Thanks  [employer]. Alice Reichert, M.D. 09/24/2017, 3:00 PM

## 2017-09-24 NOTE — Progress Notes (Signed)
[place] [facility] [place] NAME: Craig Maldonado    MR#:  [id number]  DATE OF BIRTH:  07-04-1951  SUBJECTIVE:   Hemoglobin stable, no acute events overnight. Patient underwent ultrasound-guided paracentesis with 2.6 L of fluid removed.  Patient denies any abdominal pain, nausea, vomiting.  REVIEW OF SYSTEMS:    Review of Systems  Constitutional: Negative for chills and fever.  HENT: Negative for congestion and tinnitus.   Eyes: Negative for blurred vision and double vision.  Respiratory: Negative for cough, shortness of breath and wheezing.   Cardiovascular: Negative for chest pain, orthopnea and PND.  Gastrointestinal: Negative for abdominal pain, diarrhea, nausea and vomiting.  Genitourinary: Negative for dysuria and hematuria.  Musculoskeletal: Positive for joint pain (left hip).  Neurological: Negative for dizziness, sensory change and focal weakness.  All other systems reviewed and are negative.   Nutrition: Clear liquids Tolerating Diet: yes Tolerating PT: Await Eval.   DRUG ALLERGIES:   Allergies  Allergen Reactions  . Codeine Nausea And Vomiting    VITALS:  Blood pressure (!) 152/103, pulse 79, temperature 98.2 F (36.8 C), temperature source Oral, resp. rate 20, height 5\' 11"  (1.803 m), weight 102.9 kg (226 lb 12.8 oz), SpO2 95 %.  PHYSICAL EXAMINATION:   Physical Exam  GENERAL:  66 y.o.-year-old patient lying in bed in no acute distress.  EYES: Pupils equal, round, reactive to light and accommodation. + scleral icterus. Extraocular muscles intact.  HEENT: Head atraumatic, normocephalic. Oropharynx and nasopharynx clear.  NECK:  Supple, no jugular venous distention. No thyroid enlargement, no tenderness.  LUNGS: Normal breath sounds bilaterally, no wheezing, rales, rhonchi. No use of accessory muscles of respiration.  CARDIOVASCULAR: S1, S2 normal. II/VI SEM at RSB, No rubs, or gallops.  ABDOMEN: Soft, nontender, slightly  distended. Bowel sounds present. No organomegaly or mass.  EXTREMITIES: No cyanosis, clubbing, +1-2 edema b/l NEUROLOGIC: Cranial nerves II through XII are intact. No focal Motor or sensory deficits b/l.  Globally weak PSYCHIATRIC: The patient is alert and oriented x 3.  SKIN: No obvious rash, lesion, or ulcer.    LABORATORY PANEL:   CBC Recent Labs  Lab 09/24/17 0509  WBC 8.5  HGB 8.1*  HCT 25.7*  PLT 122*   ------------------------------------------------------------------------------------------------------------------  Chemistries  Recent Labs  Lab 09/21/17 0942  09/24/17 0509  NA  --    < > 135  K  --    < > 3.7  CL  --    < > 101  CO2  --    < > 26  GLUCOSE  --    < > 113*  BUN  --    < > 20  CREATININE  --    < > 1.02  CALCIUM  --    < > 8.3*  MG 2.0  --   --   AST  --    < > 143*  ALT  --    < > 57  ALKPHOS  --    < > 108  BILITOT  --    < > 19.6*   < > = values in this interval not displayed.   ------------------------------------------------------------------------------------------------------------------  Cardiac Enzymes No results for input(s): TROPONINI in the last 168 hours. ------------------------------------------------------------------------------------------------------------------  RADIOLOGY:  US Renal  Result Date: 09/24/2017 CLINICAL DATA:  Acute renal failure. History of alcoholic cirrhosis, current smoker. EXAM: RENAL / URINARY TRACT ULTRASOUND COMPLETE COMPARISON:  Abdominal and pelvic CT scan of February 11, 2016 FINDINGS: Right  Kidney: Length: 12.2 cm. The renal cortical echotexture remains lower than that of the liver. There is no hydronephrosis. Left Kidney: Length: 12.6 cm. The renal cortical echotexture is similar to that on the right. There is no hydronephrosis. Bladder: Appears normal for degree of bladder distention. IMPRESSION: Somewhat limited study due to the patient's body habitus. There is no hydronephrosis. The renal cortical  echotexture remains lower than that of the liver. Electronically Signed   By: David  Martinique M.D.   On: 09/24/2017 10:54   US Paracentesis  Result Date: 09/24/2017 INDICATION: Recurrent ascites EXAM: ULTRASOUND GUIDED  PARACENTESIS MEDICATIONS: None. COMPLICATIONS: None immediate. PROCEDURE: Informed written consent was obtained from the patient after a discussion of the risks, benefits and alternatives to treatment. A timeout was performed prior to the initiation of the procedure. Initial ultrasound scanning demonstrates a large amount of ascites within the right lower abdominal quadrant. The right lower abdomen was prepped and draped in the usual sterile fashion. 1% lidocaine was used for local anesthesia. Following this, a 6 Fr Safe-T-Centesis catheter was introduced. An ultrasound image was saved for documentation purposes. The paracentesis was performed. The catheter was removed and a dressing was applied. The patient tolerated the procedure well without immediate post procedural complication. FINDINGS: A total of approximately 2.6 L of bilirubin stained fluid was removed. Samples were sent to the laboratory as requested by the clinical team. IMPRESSION: Successful ultrasound-guided paracentesis yielding 2.6 liters of peritoneal fluid. Electronically Signed   By: Inez Catalina M.D.   On: 09/24/2017 10:52   Dg Chest Port 1 View  Result Date: 09/23/2017 CLINICAL DATA:  Cough EXAM: PORTABLE CHEST 1 VIEW COMPARISON:  09/20/2017 FINDINGS: Mild cardiomegaly. Aortic atherosclerosis. Elevation of the right diaphragm. Increasing bibasilar opacity. Small right effusion. No pneumothorax. IMPRESSION: 1. Increasing bibasilar streaky opacity may reflect worsening atelectasis or small infiltrates. 2. Small right effusion 3. Borderline to mild cardiomegaly Electronically Signed   By: Donavan Foil M.D.   On: 09/23/2017 23:58     ASSESSMENT AND PLAN:   66 year old male with past medical history of alcohol abuse,  alcoholic liver cirrhosis who presented to the hospital after a fall and also increasing abdominal girth and pain.  1. Symptomatic anemia-patient presented to the hospital with a hemoglobin of 6.3. -Patient has been transfused 2 units of packed blood cells and Hg. Stable at 8.1 today.   -follow Serial Hg.   2. GI bleed-suspected to be an upper GI bleed given patient's melanotic stools. -Status post upper GI endoscopy showing gastritis and a duodenal ulcer which was treated. Hemoglobin stable at 8.1 today. - will switch to Oral PPI. D/c Octreotide.  Advance diet, follow serial hemoglobin.  3. Liver cirrhosis with ascites-patient has significant abdominal distention and CT scan was suggestive of moderate to large volume ascites. Status post ultrasound-guided paracentesis with 2.6 L of fluid removed. Continue diuretics, continue empiric Rocephin and await cultures from the body fluid. Appreciate gastroenterology input and started on pentoxifylline.  4. Alcohol abuse-continue CIWA protocol. No clinical evidence of withdrawal presently.  5. Acute kidney injury-appreciate nephrology input and this is likely secondary to diuretic use, GI blood loss and also use of ibuprofen at home. -Improved with IV fluids, albumin infusions. Creatinine back to baseline.  6. hypokalemia-improved with supplementation will continue to monitor.  7. Hyponatremia-secondary to alcohol abuse and liver disease. Asymptomatic.   - sodium level normal now.   Will get PT eval today.   Patient's prognosis is poor to guarded given his  advanced alcoholic liver disease.   All the records are reviewed and case discussed with Care Management/Social Worker. Management plans discussed with the patient, family and they are in agreement.  CODE STATUS: Full code  DVT Prophylaxis: Teds and SCDs  TOTAL TIME TAKING CARE OF THIS PATIENT: 30 minutes.   POSSIBLE D/C IN 1-2 DAYS, DEPENDING ON CLINICAL  CONDITION.   Henreitta Leber M.D on 09/24/2017 at 2:38 PM  Between 7am to 6pm - Pager - [phone]  After 6pm go to [email or web address] - Technical brewer [facility]  Office  [phone]  CC: Primary care physician; Lucilla Lame, MD

## 2017-09-24 NOTE — Progress Notes (Signed)
Dr Marcille Blanco notified critical lab . Total bilirubin 19.6, no new orders.

## 2017-09-25 LAB — HEMOGLOBIN: HEMOGLOBIN: 7.5 g/dL — AB (ref 13.0–18.0)

## 2017-09-25 LAB — SURGICAL PATHOLOGY

## 2017-09-25 MED ORDER — THIAMINE HCL 100 MG PO TABS: 100.0000 mg | ORAL_TABLET | Freq: Every day | ORAL | 0 refills | Status: AC

## 2017-09-25 MED ORDER — FERROUS SULFATE 325 (65 FE) MG PO TABS: 325.0000 mg | ORAL_TABLET | Freq: Every day | ORAL | 0 refills | Status: DC

## 2017-09-25 MED ORDER — PANTOPRAZOLE SODIUM 40 MG PO TBEC
40.0000 mg | DELAYED_RELEASE_TABLET | Freq: Two times a day (BID) | ORAL | 0 refills | Status: DC
Start: 2017-09-25 — End: 2017-10-09

## 2017-09-25 MED ORDER — FOLIC ACID 1 MG PO TABS: 1.0000 mg | ORAL_TABLET | Freq: Every day | ORAL | 0 refills | Status: AC

## 2017-09-25 MED ORDER — INFLUENZA VAC SPLIT QUAD 0.5 ML IM SUSY: 0.5000 mL | PREFILLED_SYRINGE | INTRAMUSCULAR | Status: DC

## 2017-09-25 MED ORDER — BENZONATATE 200 MG PO CAPS
200.0000 mg | ORAL_CAPSULE | Freq: Three times a day (TID) | ORAL | 0 refills | Status: DC | PRN
Start: 2017-09-25 — End: 2017-10-03

## 2017-09-25 MED ORDER — INFLUENZA VAC SPLIT QUAD 0.5 ML IM SUSY
0.5000 mL | PREFILLED_SYRINGE | Freq: Once | INTRAMUSCULAR | Status: AC
  Administered 2017-09-25: 0.5 mL via INTRAMUSCULAR
  Filled 2017-09-25: qty 0.5

## 2017-09-25 MED ORDER — PNEUMOCOCCAL VAC POLYVALENT 25 MCG/0.5ML IJ INJ
0.5000 mL | INJECTION | Freq: Once | INTRAMUSCULAR | Status: AC
  Administered 2017-09-25: 0.5 mL via INTRAMUSCULAR
  Filled 2017-09-25: qty 0.5

## 2017-09-25 MED ORDER — PNEUMOCOCCAL VAC POLYVALENT 25 MCG/0.5ML IJ INJ: 0.5000 mL | INJECTION | INTRAMUSCULAR | Status: DC

## 2017-09-25 MED ORDER — PENTOXIFYLLINE ER 400 MG PO TBCR: 400.0000 mg | EXTENDED_RELEASE_TABLET | Freq: Three times a day (TID) | ORAL | 0 refills | Status: DC

## 2017-09-25 NOTE — Care Management Important Message (Signed)
Important Message  Patient Details  Name: Craig Zehren MRN: [id number] Date of Birth: 12-03-1950   Medicare Important Message Given:  Yes    Beverly Sessions, RN 09/25/2017, 12:11 PM

## 2017-09-25 NOTE — Discharge Summary (Signed)
[place] [facility] [place] NAME: Craig Maldonado    MR#:  [id number]  DATE OF BIRTH:  04/26/1951  DATE OF ADMISSION:  09/20/2017 ADMITTING PHYSICIAN: Lance Coon, MD  DATE OF DISCHARGE: 09/25/2017  PRIMARY CARE PHYSICIAN: Lucilla Lame, MD    ADMISSION DIAGNOSIS:  Weakness [R53.1] Acute renal insufficiency [N28.9] Ascites [R18.8] Gastrointestinal hemorrhage, unspecified gastrointestinal hemorrhage type [K92.2] Liver failure without hepatic coma, unspecified chronicity ([place]) [K72.90]  DISCHARGE DIAGNOSIS:  Principal Problem:   Alcoholic cirrhosis of liver with ascites ([place]) Active Problems:   Alcohol abuse   Hyperbilirubinemia   Transaminitis   AKI (acute kidney injury) ([place])   SECONDARY DIAGNOSIS:   Past Medical History:  Diagnosis Date  . Alcohol abuse   . Alcoholic cirrhosis ([place])   . Gastric varices     HOSPITAL COURSE:   65 year old male with past medical history of alcohol abuse, alcoholic liver cirrhosis who presented to the hospital after a fall and also increasing abdominal girth and pain.  1. Symptomatic anemia-patient presented to the hospital with a hemoglobin of 6.3. -Patient has been transfused 2 units of packed blood cells andHg. Stable at 7.5 today. - pt. Is hemodynamically stable and asymptomatic and therefore being discharged home.   2. GI bleed-suspected to be an upper GI bleed given patient's melanotic stools. -Status post upper GI endoscopy showing gastritis and a duodenal ulcer which was treated. Hemoglobin stable at 7.5 today. -Patient is being discharged on oral Protonix. -Patient will follow up with GI in the next 2 weeks.  3. Liver cirrhosis with ascites-patient had significant abdominal distention and CT scan was suggestive of moderate to large volume ascites. Status post ultrasound-guided paracentesis with 2.6 L of fluid removed. Patient was treated with 5 days of IV ceftriaxone empirically for SBP.  Unfortunately fluid studies were not sent. Clinically patient is afebrile and hemodynamically stable and denies any abdominal pain. -Patient was seen by gastroenterology and started on pentoxifylline and is being discharged on that.  4. Alcohol abuse-patient was placed on CIWA protocol while in the hospital. Clinically he shows no signs of alcohol withdrawal.  5. Acute kidney injury- pt. Was seen by nephrology input and this was likely secondary to diuretic use, GI blood loss and also use of ibuprofen at home. - Patient was given IV fluids and albumin infusions and patient's creatinine has improved and is currently back to baseline.  6. hypokalemia-improved and resolved w/ supplementation.   7. Hyponatremia-secondary to alcohol abuse and liver disease. Asymptomatic.  Stable.   Patient was seen by physical therapy and recommended short-term rehabilitation but patient refuses to go to rehabilitation presently. He is being discharged home with home health services with physical therapy, nursing, home health aide and also social work.  DISCHARGE CONDITIONS:   Stable.   CONSULTS OBTAINED:    DRUG ALLERGIES:   Allergies  Allergen Reactions  . Codeine Nausea And Vomiting    DISCHARGE MEDICATIONS:   Allergies as of 09/25/2017      Reactions   Codeine Nausea And Vomiting      Medication List    STOP taking these medications   acetaminophen 325 MG tablet Commonly known as:  TYLENOL   docusate sodium 100 MG capsule Commonly known as:  COLACE   furosemide 40 MG tablet Commonly known as:  LASIX   nicotine 14 mg/24hr patch Commonly known as:  NICODERM CQ - dosed in mg/24 hours   oxyCODONE 5 MG immediate release tablet Commonly known as:  Oxy IR/ROXICODONE   polyethylene glycol packet Commonly known as:  MIRALAX / GLYCOLAX   spironolactone 25 MG tablet Commonly known as:  ALDACTONE     TAKE these medications   benzonatate 200 MG capsule Commonly known as:   TESSALON Take 1 capsule (200 mg total) 3 (three) times daily as needed by mouth for cough.   ferrous sulfate 325 (65 FE) MG tablet Take 1 tablet (325 mg total) daily with breakfast by mouth.   folic acid 1 MG tablet Commonly known as:  FOLVITE Take 1 tablet (1 mg total) daily by mouth.   multivitamin with minerals Tabs tablet Take 1 tablet by mouth daily.   pantoprazole 40 MG tablet Commonly known as:  PROTONIX Take 1 tablet (40 mg total) 2 (two) times daily by mouth.   pentoxifylline 400 MG CR tablet Commonly known as:  TRENTAL Take 1 tablet (400 mg total) 3 (three) times daily with meals by mouth.   thiamine 100 MG tablet Take 1 tablet (100 mg total) daily by mouth.         DISCHARGE INSTRUCTIONS:   DIET:  Cardiac diet  DISCHARGE CONDITION:  Stable  ACTIVITY:  Activity as tolerated  OXYGEN:  Home Oxygen: No.   Oxygen Delivery: room air  DISCHARGE LOCATION:  Home with Home Health PT, RN, Aide, Social Work.    If you experience worsening of your admission symptoms, develop shortness of breath, life threatening emergency, suicidal or homicidal thoughts you must seek medical attention immediately by calling 911 or calling your MD immediately  if symptoms less severe.  You Must read complete instructions/literature along with all the possible adverse reactions/side effects for all the Medicines you take and that have been prescribed to you. Take any new Medicines after you have completely understood and accpet all the possible adverse reactions/side effects.   Please note  You were cared for by a hospitalist during your hospital stay. If you have any questions about your discharge medications or the care you received while you were in the hospital after you are discharged, you can call the unit and asked to speak with the hospitalist on call if the hospitalist that took care of you is not available. Once you are discharged, your primary care physician will handle  any further medical issues. Please note that NO REFILLS for any discharge medications will be authorized once you are discharged, as it is imperative that you return to your primary care physician (or establish a relationship with a primary care physician if you do not have one) for your aftercare needs so that they can reassess your need for medications and monitor your lab values.     Today   No acute events overnight. Hg. Stable. Tolerating PO well.  Worked with PT and complaining of some pain in his right hamstring.    VITAL SIGNS:  Blood pressure 106/75, pulse 82, temperature 97.6 F (36.4 C), temperature source Oral, resp. rate 20, height 5\' 11"  (1.803 m), weight 97.7 kg (215 lb 6.4 oz), SpO2 94 %.  I/O:    Intake/Output Summary (Last 24 hours) at 09/25/2017 1454 Last data filed at 09/25/2017 1435 Gross per 24 hour  Intake 290 ml  Output 1435 ml  Net -1145 ml    PHYSICAL EXAMINATION:    GENERAL:  66 y.o.-year-old patient lying in bed in no acute distress.  EYES: Pupils equal, round, reactive to light and accommodation. + scleral icterus. Extraocular muscles intact.  HEENT: Head atraumatic, normocephalic. Oropharynx and  nasopharynx clear.  NECK:  Supple, no jugular venous distention. No thyroid enlargement, no tenderness.  LUNGS: Normal breath sounds bilaterally, no wheezing, rales, rhonchi. No use of accessory muscles of respiration.  CARDIOVASCULAR: S1, S2 normal. II/VI SEM at RSB, No rubs, or gallops.  ABDOMEN: Soft, nontender, slightly distended. Bowel sounds present. No organomegaly or mass.  EXTREMITIES: No cyanosis, clubbing, +1-2 edema b/l NEUROLOGIC: Cranial nerves II through XII are intact. No focal Motor or sensory deficits b/l.  Globally weak PSYCHIATRIC: The patient is alert and oriented x 3.  SKIN: No obvious rash, lesion, or ulcer.     DATA REVIEW:   CBC Recent Labs  Lab 09/24/17 0509 09/25/17 0346  WBC 8.5  --   HGB 8.1* 7.5*  HCT 25.7*  --   PLT  122*  --     Chemistries  Recent Labs  Lab 09/21/17 0942  09/24/17 0509  NA  --    < > 135  K  --    < > 3.7  CL  --    < > 101  CO2  --    < > 26  GLUCOSE  --    < > 113*  BUN  --    < > 20  CREATININE  --    < > 1.02  CALCIUM  --    < > 8.3*  MG 2.0  --   --   AST  --    < > 143*  ALT  --    < > 57  ALKPHOS  --    < > 108  BILITOT  --    < > 19.6*   < > = values in this interval not displayed.    Cardiac Enzymes No results for input(s): TROPONINI in the last 168 hours.  Microbiology Results  Results for orders placed or performed during the hospital encounter of 02/11/16  CULTURE, BLOOD (ROUTINE X 2) w Reflex to PCR ID Panel     Status: None   Collection Time: 02/18/16  2:30 PM  Result Value Ref Range Status   Specimen Description BLOOD LEFT ANTECUBITAL  Final   Special Requests BOTTLES DRAWN AEROBIC AND ANAEROBIC  [place]  Final   Culture NO GROWTH 5 DAYS  Final   Report Status 02/23/2016 FINAL  Final  CULTURE, BLOOD (ROUTINE X 2) w Reflex to PCR ID Panel     Status: None   Collection Time: 02/18/16  2:30 PM  Result Value Ref Range Status   Specimen Description BLOOD RIGHT ANTECUBITAL  Final   Special Requests BOTTLES DRAWN AEROBIC AND ANAEROBIC  8CC  Final   Culture NO GROWTH 5 DAYS  Final   Report Status 02/23/2016 FINAL  Final    RADIOLOGY:  US Renal  Result Date: 09/24/2017 CLINICAL DATA:  Acute renal failure. History of alcoholic cirrhosis, current smoker. EXAM: RENAL / URINARY TRACT ULTRASOUND COMPLETE COMPARISON:  Abdominal and pelvic CT scan of February 11, 2016 FINDINGS: Right Kidney: Length: 12.2 cm. The renal cortical echotexture remains lower than that of the liver. There is no hydronephrosis. Left Kidney: Length: 12.6 cm. The renal cortical echotexture is similar to that on the right. There is no hydronephrosis. Bladder: Appears normal for degree of bladder distention. IMPRESSION: Somewhat limited study due to the patient's body habitus. There is no  hydronephrosis. The renal cortical echotexture remains lower than that of the liver. Electronically Signed   By: David  Martinique M.D.   On: 09/24/2017 10:54  US Paracentesis  Result Date: 09/24/2017 INDICATION: Recurrent ascites EXAM: ULTRASOUND GUIDED  PARACENTESIS MEDICATIONS: None. COMPLICATIONS: None immediate. PROCEDURE: Informed written consent was obtained from the patient after a discussion of the risks, benefits and alternatives to treatment. A timeout was performed prior to the initiation of the procedure. Initial ultrasound scanning demonstrates a large amount of ascites within the right lower abdominal quadrant. The right lower abdomen was prepped and draped in the usual sterile fashion. 1% lidocaine was used for local anesthesia. Following this, a 6 Fr Safe-T-Centesis catheter was introduced. An ultrasound image was saved for documentation purposes. The paracentesis was performed. The catheter was removed and a dressing was applied. The patient tolerated the procedure well without immediate post procedural complication. FINDINGS: A total of approximately 2.6 L of bilirubin stained fluid was removed. Samples were sent to the laboratory as requested by the clinical team. IMPRESSION: Successful ultrasound-guided paracentesis yielding 2.6 liters of peritoneal fluid. Electronically Signed   By: Inez Catalina M.D.   On: 09/24/2017 10:52   Dg Chest Port 1 View  Result Date: 09/23/2017 CLINICAL DATA:  Cough EXAM: PORTABLE CHEST 1 VIEW COMPARISON:  09/20/2017 FINDINGS: Mild cardiomegaly. Aortic atherosclerosis. Elevation of the right diaphragm. Increasing bibasilar opacity. Small right effusion. No pneumothorax. IMPRESSION: 1. Increasing bibasilar streaky opacity may reflect worsening atelectasis or small infiltrates. 2. Small right effusion 3. Borderline to mild cardiomegaly Electronically Signed   By: Donavan Foil M.D.   On: 09/23/2017 23:58      Management plans discussed with the patient, family  and they are in agreement.  CODE STATUS:     Code Status Orders  (From admission, onward)        Start     Ordered   09/21/17 0051  Full code  Continuous     09/21/17 0050    Code Status History    Date Active Date Inactive Code Status Order ID Comments User Context   02/11/2016 07:36 02/22/2016 20:28 Full Code [id number]  Loletha Grayer, MD ED      TOTAL TIME TAKING CARE OF THIS PATIENT: 40 minutes.    Henreitta Leber M.D on 09/25/2017 at 2:54 PM  Between 7am to 6pm - Pager - [phone]  After 6pm go to [email or web address] - Technical brewer [facility]  Office  [phone]  CC: Primary care physician; Lucilla Lame, MD

## 2017-09-25 NOTE — Evaluation (Signed)
Physical Therapy Evaluation Patient Details Name: Loc Feinstein MRN: [id number] DOB: [birth year] Today's Date: 09/25/2017   History of Present Illness  Pt is a 66 year old male with past medical history of alcohol abuse, alcoholic liver cirrhosis who presented to the hospital after a fall and also increasing abdominal girth and pain.  Assessment includes: Symptomatic anemia, GI bleed, liver cirrhosis with ascites s/p 2.6 L of fluid removed, alcohol abuse with CIWA protocol, AKI, hypokalemia, and hyponatremia.     Clinical Impression  Pt presents with deficits in strength, transfers, gait, balance, and activity tolerance.  Pt Ind with bed mobility with good effort and without extra time required for tasks.  Pt able to stand from EOB with SBA but required extra time and effort and presented with min instability upon initial stand.  Pt able to perform limited standing therex then abruptly returned to sitting at EOB complaining of 9/10 L hip pain that pt reports has been present with weight bearing since a fall "about a week ago", nursing notified.  Pt will benefit from PT services in a SNF setting upon discharge to safely address above deficits for decreased caregiver assistance and eventual return to PLOF.      Follow Up Recommendations SNF    Equipment Recommendations  None recommended by PT    Recommendations for Other Services       Precautions / Restrictions Precautions Precautions: Fall Restrictions Weight Bearing Restrictions: No      Mobility  Bed Mobility Overal bed mobility: Independent                Transfers Overall transfer level: Needs assistance Equipment used: Rolling walker (2 wheeled) Transfers: Sit to/from Stand Sit to Stand: Supervision         General transfer comment: Extra effort and time required with min instability upon initial stand but no assistance to correct  Ambulation/Gait             General Gait Details: Unable secondary to L hip  pain with weight bearing, nursing notified  Stairs            Wheelchair Mobility    Modified Rankin (Stroke Patients Only)       Balance Overall balance assessment: Needs assistance Sitting-balance support: Feet unsupported;Feet supported;No upper extremity supported;Bilateral upper extremity supported Sitting balance-Leahy Scale: Good     Standing balance support: Bilateral upper extremity supported Standing balance-Leahy Scale: Fair                               Pertinent Vitals/Pain Pain Assessment: No/denies pain    Home Living Family/patient expects to be discharged to:: Private residence Living Arrangements: Children Available Help at Discharge: Family;Available 24 hours/day Type of Home: House Home Access: Level entry     Home Layout: One level Home Equipment: Walker - 2 wheels;Walker - 4 wheels      Prior Function Level of Independence: Independent         Comments: Ind amb community distances without AD, Ind with ADLs, 2-3 recent falls secondary to losing his balance     Hand Dominance   Dominant Hand: Left    Extremity/Trunk Assessment   Upper Extremity Assessment Upper Extremity Assessment: Overall WFL for tasks assessed    Lower Extremity Assessment Lower Extremity Assessment: Generalized weakness       Communication   Communication: No difficulties  Cognition Arousal/Alertness: Lethargic Behavior During Therapy: WFL for tasks assessed/performed Overall Cognitive  Status: Within Functional Limits for tasks assessed                                        General Comments      Exercises Total Joint Exercises Ankle Circles/Pumps: AROM;Both;10 reps Quad Sets: Strengthening;Both;10 reps Gluteal Sets: Strengthening;Both;10 reps Long Arc Quad: AROM;Both;5 reps;10 reps Knee Flexion: AROM;Both;5 reps;10 reps Marching in Standing: AROM;Both;10 reps Other Exercises Other Exercises: B seated hip flex x  10 Other Exercises: HEP education for BLE APs, QS, and GS x 10 each 5-6x/day   Assessment/Plan    PT Assessment Patient needs continued PT services  PT Problem List Decreased strength;Decreased activity tolerance;Decreased balance;Decreased knowledge of use of DME       PT Treatment Interventions DME instruction;Gait training;Functional mobility training;Neuromuscular re-education;Balance training;Therapeutic exercise;Therapeutic activities;Patient/family education    PT Goals (Current goals can be found in the Care Plan section)  Acute Rehab PT Goals Patient Stated Goal: To get up and go home PT Goal Formulation: With patient Time For Goal Achievement: 10/08/17 Potential to Achieve Goals: Good    Frequency Min 2X/week   Barriers to discharge Inaccessible home environment      Co-evaluation               AM-PAC PT "6 Clicks" Daily Activity  Outcome Measure Difficulty turning over in bed (including adjusting bedclothes, sheets and blankets)?: None Difficulty moving from lying on back to sitting on the side of the bed? : None Difficulty sitting down on and standing up from a chair with arms (e.g., wheelchair, bedside commode, etc,.)?: A Little Help needed moving to and from a bed to chair (including a wheelchair)?: A Little Help needed walking in hospital room?: Total Help needed climbing 3-5 steps with a railing? : Total 6 Click Score: 16    End of Session Equipment Utilized During Treatment: Gait belt Activity Tolerance: Patient limited by pain Patient left: in bed;with call bell/phone within reach;with bed alarm set Nurse Communication: Mobility status;Other (comment)(Pt's 9/10 L hip pain with weight bearing) PT Visit Diagnosis: Muscle weakness (generalized) (M62.81);Difficulty in walking, not elsewhere classified (R26.2)    Time: 8088-1103 PT Time Calculation (min) (ACUTE ONLY): 22 min   Charges:   PT Evaluation $PT Eval Low Complexity: 1 Low PT  Treatments $Therapeutic Exercise: 8-22 mins   PT G Codes:   PT G-Codes **NOT FOR INPATIENT CLASS** Functional Assessment Tool Used: AM-PAC 6 Clicks Basic Mobility Functional Limitation: Mobility: Walking and moving around Mobility: Walking and Moving Around Current Status (P5945): At least 40 percent but less than 60 percent impaired, limited or restricted Mobility: Walking and Moving Around Goal Status [phone]): At least 1 percent but less than 20 percent impaired, limited or restricted    D. Scott Lasonia Casino PT, DPT 09/25/17, 10:20 AM

## 2017-09-25 NOTE — Progress Notes (Signed)
Patient has received discharge orders to go home. Patient alert and oriented. Educated patient on asking for assistance with with ambulation due to pain that developed when he was working with PT this morning. When assessing patients pain after it was reported by PT patient stated " its not hurting any more" therefore no pain medication given.

## 2017-09-25 NOTE — Care Management (Signed)
Patient to discharge home today.  PT assessed patient, and recommend SNF.  Patient declines SNF and wishes to return home with home health services.  Patient states that he does not have a preference of home health agency.  Referral made to [facility] with [facility]. Patient has RW and BSC in the home.   Patient lives at home with son.  Patient states that he will not have transportation till 09/25/17.  Patients states that his son has his care "but he is probably at a party".  RNCM attempted to contact son Harrell Gave at number provided.  Received recording that phone is not accepting incoming calls.  Patient states there is no one else to call for transportation.  RNCM requested well check from [employer].  Received call from from deputy stating that there was no one at the time.  Bedside RN received call from patient's "daughter" stating that the police just came to the home, but that she didn't answer the door because she is intoxicated and can not come to pick the patient up.  Patient states that he has a key to his home in his belongings.  RN to to verify that patient has key.  Patient to transport home via EMS.  RNCM notified patient that transport via EMS may not be covered by insurance.   Patient states that his PCP is Larene [facility].  RNCM contacted MD office.  Notified that Dr. Luan Pulling retired in March.  New patient appointment was made with Cassell Smiles 11/20.  Appointment information added to discharge summary.  RNCM signing off.

## 2017-09-25 NOTE — Care Management (Signed)
Patient's belongings were returned by security.  Patient has a key to his car, but not to his home.  Unable to send patient by EMS without confirmation that someone is in the home to let him in when he arrives, or that he has a key to enter his own home.  Attempted to called patient's home phone number x3, left voicemail x2.  Case reviewed with [facility] leadership Deveron Furlong.  Agreement to issue a HINN 12 of non coverage.  Form signed and sent to HIM.  Informed patient that we could not send him via EMS at this time due to the circumstances.  Patient does have $20 as well as credit card in his wallet.  Patient is aware that it is an option for him to pay for a cab to get home.

## 2017-09-26 NOTE — Care Management (Addendum)
This RNCM has been reached out to by covering [facility] with request assistance with discharge.  I have reached out to house administrative coordinator Kennyth Lose and we will meet with patient. I have left message (as many others have done) for son Billee Cashing [phone] asking for assistance or the potential need to get social services involved for potential abandonment. Patient is apparently able to make health care decisions however I will assess that also. Per CSW abandonment concern is not appropriate as patient is able to make his own decisions.  Per Shearon Balo patient is calling taxi and has been able to contact land lord to get into his home. Case closed.

## 2017-09-26 NOTE — Discharge Summary (Signed)
[facility] [place] NAME: Craig Maldonado    MR#:  [id number]  DATE OF BIRTH:  [birth year]  DATE OF ADMISSION:  09/20/2017 ADMITTING PHYSICIAN: Lance Coon, MD  DATE OF DISCHARGE: 09/26/2017  PRIMARY CARE PHYSICIAN: Lucilla Lame, MD    ADMISSION DIAGNOSIS:  Weakness [R53.1] Acute renal insufficiency [N28.9] Ascites [R18.8] Gastrointestinal hemorrhage, unspecified gastrointestinal hemorrhage type [K92.2] Liver failure without hepatic coma, unspecified chronicity ([place]) [K72.90]  DISCHARGE DIAGNOSIS:  Principal Problem:   Alcoholic cirrhosis of liver with ascites ([place]) Active Problems:   Alcohol abuse   Hyperbilirubinemia   Transaminitis   AKI (acute kidney injury) ([place])   SECONDARY DIAGNOSIS:   Past Medical History:  Diagnosis Date  . Alcohol abuse   . Alcoholic cirrhosis ([place])   . Gastric varices     HOSPITAL COURSE:   66 year old male with past medical history of alcohol abuse, alcoholic liver cirrhosis who presented to the hospital after a fall and also increasing abdominal girth and pain.  1. Symptomatic anemia-patient presented to the hospital with a hemoglobin of 6.3. -Patient has been transfused 2 units of packed blood cells andHg. Stable at 7.5 today. - pt. Is hemodynamically stable and asymptomatic and therefore being discharged home.   2. GI bleed-suspected to be an upper GI bleed given patient's melanotic stools. -Status post upper GI endoscopy showing gastritis and a duodenal ulcer which was treated. Hemoglobin stable at 7.5 today. -Patient is being discharged on oral Protonix. -Patient will follow up with GI in the next 2 weeks.  3. Liver cirrhosis with ascites-patient had significant abdominal distention and CT scan was suggestive of moderate to large volume ascites. Status post ultrasound-guided paracentesis with 2.6 L of fluid removed. Patient was treated with 5 days of IV ceftriaxone empirically for SBP.  Unfortunately fluid studies were not sent. Clinically patient is afebrile and hemodynamically stable and denies any abdominal pain. -Patient was seen by gastroenterology and started on pentoxifylline and is being discharged on that.  4. Alcohol abuse-patient was placed on CIWA protocol while in the hospital. Clinically he shows no signs of alcohol withdrawal.  5. Acute kidney injury- pt. Was seen by nephrology input and this was likely secondary to diuretic use, GI blood loss and also use of ibuprofen at home. - Patient was given IV fluids and albumin infusions and patient's creatinine has improved and is currently back to baseline.  6. hypokalemia-improved and resolved w/ supplementation.   7. Hyponatremia-secondary to alcohol abuse and liver disease. Asymptomatic.  Stable.   Patient was seen by physical therapy and recommended short-term rehabilitation but patient refuses to go to rehabilitation presently. He is being discharged home with home health services with physical therapy, nursing, home health aide and also social work.  DISCHARGE CONDITIONS:   Stable. Discharge to home with HHPT.  CONSULTS OBTAINED:    DRUG ALLERGIES:   Allergies  Allergen Reactions  . Codeine Nausea And Vomiting    DISCHARGE MEDICATIONS:   Allergies as of 09/26/2017      Reactions   Codeine Nausea And Vomiting      Medication List    STOP taking these medications   acetaminophen 325 MG tablet Commonly known as:  TYLENOL   docusate sodium 100 MG capsule Commonly known as:  COLACE   furosemide 40 MG tablet Commonly known as:  LASIX   nicotine 14 mg/24hr patch Commonly known as:  NICODERM CQ - dosed in mg/24 hours   oxyCODONE 5 MG immediate release  tablet Commonly known as:  Oxy IR/ROXICODONE   polyethylene glycol packet Commonly known as:  MIRALAX / GLYCOLAX   spironolactone 25 MG tablet Commonly known as:  ALDACTONE     TAKE these medications   benzonatate 200 MG  capsule Commonly known as:  TESSALON Take 1 capsule (200 mg total) 3 (three) times daily as needed by mouth for cough.   ferrous sulfate 325 (65 FE) MG tablet Take 1 tablet (325 mg total) daily with breakfast by mouth.   folic acid 1 MG tablet Commonly known as:  FOLVITE Take 1 tablet (1 mg total) daily by mouth.   multivitamin with minerals Tabs tablet Take 1 tablet by mouth daily.   pantoprazole 40 MG tablet Commonly known as:  PROTONIX Take 1 tablet (40 mg total) 2 (two) times daily by mouth.   pentoxifylline 400 MG CR tablet Commonly known as:  TRENTAL Take 1 tablet (400 mg total) 3 (three) times daily with meals by mouth.   thiamine 100 MG tablet Take 1 tablet (100 mg total) daily by mouth.         DISCHARGE INSTRUCTIONS:   DIET:  Cardiac diet  DISCHARGE CONDITION:  Stable  ACTIVITY:  Activity as tolerated  OXYGEN:  Home Oxygen: No.   Oxygen Delivery: room air  DISCHARGE LOCATION:  Home with Home Health PT, RN, Aide, Social Work.    If you experience worsening of your admission symptoms, develop shortness of breath, life threatening emergency, suicidal or homicidal thoughts you must seek medical attention immediately by calling 911 or calling your MD immediately  if symptoms less severe.  You Must read complete instructions/literature along with all the possible adverse reactions/side effects for all the Medicines you take and that have been prescribed to you. Take any new Medicines after you have completely understood and accpet all the possible adverse reactions/side effects.   Please note  You were cared for by a hospitalist during your hospital stay. If you have any questions about your discharge medications or the care you received while you were in the hospital after you are discharged, you can call the unit and asked to speak with the hospitalist on call if the hospitalist that took care of you is not available. Once you are discharged, your primary  care physician will handle any further medical issues. Please note that NO REFILLS for any discharge medications will be authorized once you are discharged, as it is imperative that you return to your primary care physician (or establish a relationship with a primary care physician if you do not have one) for your aftercare needs so that they can reassess your need for medications and monitor your lab values.     Today   No acute events overnight. Hg. Stable. Tolerating PO well.  Worked with PT and complaining of some pain in his right hamstring.    VITAL SIGNS:  Blood pressure 105/60, pulse 85, temperature 97.9 F (36.6 C), temperature source Oral, resp. rate 14, height 5\' 11"  (1.803 m), weight 215 lb 6.4 oz (97.7 kg), SpO2 93 %.  I/O:    Intake/Output Summary (Last 24 hours) at 09/26/2017 1322 Last data filed at 09/26/2017 1303 Gross per 24 hour  Intake 650 ml  Output 1295 ml  Net -645 ml    PHYSICAL EXAMINATION:    GENERAL:  66 y.o.-year-old patient lying in bed in no acute distress.  EYES: Pupils equal, round, reactive to light and accommodation. + scleral icterus. Extraocular muscles intact.  HEENT:  Head atraumatic, normocephalic. Oropharynx and nasopharynx clear.  NECK:  Supple, no jugular venous distention. No thyroid enlargement, no tenderness.  LUNGS: Normal breath sounds bilaterally, no wheezing, rales, rhonchi. No use of accessory muscles of respiration.  CARDIOVASCULAR: S1, S2 normal. II/VI SEM at RSB, No rubs, or gallops.  ABDOMEN: Soft, nontender, slightly distended. Bowel sounds present. No organomegaly or mass.  EXTREMITIES: No cyanosis, clubbing, +1-2 edema b/l NEUROLOGIC: Cranial nerves II through XII are intact. No focal Motor or sensory deficits b/l.  Globally weak PSYCHIATRIC: The patient is alert and oriented x 3.  SKIN: No obvious rash, lesion, or ulcer.     DATA REVIEW:   CBC Recent Labs  Lab 09/24/17 0509 09/25/17 0346  WBC 8.5  --   HGB 8.1*  7.5*  HCT 25.7*  --   PLT 122*  --     Chemistries  Recent Labs  Lab 09/21/17 0942  09/24/17 0509  NA  --    < > 135  K  --    < > 3.7  CL  --    < > 101  CO2  --    < > 26  GLUCOSE  --    < > 113*  BUN  --    < > 20  CREATININE  --    < > 1.02  CALCIUM  --    < > 8.3*  MG 2.0  --   --   AST  --    < > 143*  ALT  --    < > 57  ALKPHOS  --    < > 108  BILITOT  --    < > 19.6*   < > = values in this interval not displayed.    Cardiac Enzymes No results for input(s): TROPONINI in the last 168 hours.  Microbiology Results  Results for orders placed or performed during the hospital encounter of 02/11/16  CULTURE, BLOOD (ROUTINE X 2) w Reflex to PCR ID Panel     Status: None   Collection Time: 02/18/16  2:30 PM  Result Value Ref Range Status   Specimen Description BLOOD LEFT ANTECUBITAL  Final   Special Requests BOTTLES DRAWN AEROBIC AND ANAEROBIC  [place]  Final   Culture NO GROWTH 5 DAYS  Final   Report Status 02/23/2016 FINAL  Final  CULTURE, BLOOD (ROUTINE X 2) w Reflex to PCR ID Panel     Status: None   Collection Time: 02/18/16  2:30 PM  Result Value Ref Range Status   Specimen Description BLOOD RIGHT ANTECUBITAL  Final   Special Requests BOTTLES DRAWN AEROBIC AND ANAEROBIC  8CC  Final   Culture NO GROWTH 5 DAYS  Final   Report Status 02/23/2016 FINAL  Final    RADIOLOGY:  No results found.    Management plans discussed with the patient, family and they are in agreement.  CODE STATUS:     Code Status Orders  (From admission, onward)        Start     Ordered   09/21/17 0051  Full code  Continuous     09/21/17 0050    Code Status History    Date Active Date Inactive Code Status Order ID Comments User Context   02/11/2016 07:36 02/22/2016 20:28 Full Code [id number]  Loletha Grayer, MD ED      TOTAL TIME TAKING CARE OF THIS PATIENT: 20 minutes.    Demetrios Loll M.D on 09/26/2017 at 1:22 PM  Between 7am  to 6pm - Pager - [phone]  After 6pm go to  [email or web address] - [employer] [place] [facility]  Office  [phone]  CC: Primary care physician; Lucilla Lame, MD

## 2017-09-26 NOTE — Progress Notes (Signed)
Patient was able to remember the number for his landlord.  Landlord was notified and said she could let Craig Maldonado into his apartment.  [facility] [employer] is here to transport the patient.  Discharge instructions reviewed and rx sent with the patient

## 2017-09-26 NOTE — Care Management (Signed)
Patient has not left the facility.  RNCM has called his sons cell phone and house phone.  Still no answer, Message left.  Patient states that he has tried to contact them as well.  Notified patient that he would have to leave today, as there is a need for the medical hospital bed.  Patient states "i can't afford a cab".  RNCM reminded patient that he has $20, a debit, and credit card in is wallet.  Patient states "oh yeah I do have that".  House supervisor notified of situation.  [facility] supervisor is to come meet with patient.

## 2017-09-26 NOTE — Care Management (Signed)
Patient to leave today.  Per nursing cab has been arranged to pick up patient.  Malachy Mood from [facility] notified of discharge.

## 2017-09-27 ENCOUNTER — Emergency Department: Payer: Medicare Other

## 2017-09-27 ENCOUNTER — Inpatient Hospital Stay
Admission: EM | Admit: 2017-09-27 | Discharge: 2017-10-03 | DRG: 433 | Disposition: A | Discharge status: Home Health | Payer: Medicare Other | Attending: Internal Medicine | Admitting: Internal Medicine

## 2017-09-27 ENCOUNTER — Other Ambulatory Visit: Payer: Self-pay

## 2017-09-27 ENCOUNTER — Telehealth: Payer: Self-pay | Admitting: Gastroenterology

## 2017-09-27 DIAGNOSIS — Z515 Encounter for palliative care: Secondary | ICD-10-CM

## 2017-09-27 DIAGNOSIS — Z7189 Other specified counseling: Secondary | ICD-10-CM

## 2017-09-27 DIAGNOSIS — F1010 Alcohol abuse, uncomplicated: Secondary | ICD-10-CM | POA: Diagnosis present

## 2017-09-27 DIAGNOSIS — K7031 Alcoholic cirrhosis of liver with ascites: Secondary | ICD-10-CM | POA: Diagnosis present

## 2017-09-27 DIAGNOSIS — D696 Thrombocytopenia, unspecified: Secondary | ICD-10-CM | POA: Diagnosis present

## 2017-09-27 DIAGNOSIS — K7040 Alcoholic hepatic failure without coma: Principal | ICD-10-CM | POA: Diagnosis present

## 2017-09-27 DIAGNOSIS — E8770 Fluid overload, unspecified: Secondary | ICD-10-CM | POA: Diagnosis not present

## 2017-09-27 DIAGNOSIS — K2970 Gastritis, unspecified, without bleeding: Secondary | ICD-10-CM | POA: Diagnosis present

## 2017-09-27 DIAGNOSIS — K267 Chronic duodenal ulcer without hemorrhage or perforation: Secondary | ICD-10-CM | POA: Diagnosis present

## 2017-09-27 DIAGNOSIS — K7011 Alcoholic hepatitis with ascites: Secondary | ICD-10-CM | POA: Diagnosis present

## 2017-09-27 DIAGNOSIS — R410 Disorientation, unspecified: Secondary | ICD-10-CM

## 2017-09-27 DIAGNOSIS — R531 Weakness: Secondary | ICD-10-CM | POA: Diagnosis not present

## 2017-09-27 DIAGNOSIS — I959 Hypotension, unspecified: Secondary | ICD-10-CM | POA: Diagnosis present

## 2017-09-27 DIAGNOSIS — R7989 Other specified abnormal findings of blood chemistry: Secondary | ICD-10-CM | POA: Diagnosis not present

## 2017-09-27 DIAGNOSIS — E876 Hypokalemia: Secondary | ICD-10-CM | POA: Diagnosis present

## 2017-09-27 DIAGNOSIS — G9340 Encephalopathy, unspecified: Secondary | ICD-10-CM | POA: Diagnosis present

## 2017-09-27 DIAGNOSIS — F17210 Nicotine dependence, cigarettes, uncomplicated: Secondary | ICD-10-CM | POA: Diagnosis present

## 2017-09-27 DIAGNOSIS — R945 Abnormal results of liver function studies: Secondary | ICD-10-CM

## 2017-09-27 DIAGNOSIS — E875 Hyperkalemia: Secondary | ICD-10-CM | POA: Diagnosis present

## 2017-09-27 DIAGNOSIS — K3189 Other diseases of stomach and duodenum: Secondary | ICD-10-CM | POA: Diagnosis present

## 2017-09-27 DIAGNOSIS — R188 Other ascites: Secondary | ICD-10-CM

## 2017-09-27 DIAGNOSIS — K766 Portal hypertension: Secondary | ICD-10-CM | POA: Diagnosis present

## 2017-09-27 DIAGNOSIS — K7460 Unspecified cirrhosis of liver: Secondary | ICD-10-CM | POA: Diagnosis not present

## 2017-09-27 DIAGNOSIS — J90 Pleural effusion, not elsewhere classified: Secondary | ICD-10-CM | POA: Diagnosis not present

## 2017-09-27 LAB — URINALYSIS, COMPLETE (UACMP) WITH MICROSCOPIC
Bacteria, UA: NONE SEEN
GLUCOSE, UA: NEGATIVE mg/dL
Hgb urine dipstick: NEGATIVE
KETONES UR: NEGATIVE mg/dL
LEUKOCYTES UA: NEGATIVE
NITRITE: NEGATIVE
PH: 5 (ref 5.0–8.0)
Protein, ur: NEGATIVE mg/dL
Specific Gravity, Urine: 1.015 (ref 1.005–1.030)

## 2017-09-27 LAB — CBC
HCT: 28.3 % — ABNORMAL LOW (ref 40.0–52.0)
HEMOGLOBIN: 9 g/dL — AB (ref 13.0–18.0)
MCH: 27.5 pg (ref 26.0–34.0)
MCHC: 32 g/dL (ref 32.0–36.0)
MCV: 85.8 fL (ref 80.0–100.0)
Platelets: 179 10*3/uL (ref 150–440)
RBC: 3.29 MIL/uL — ABNORMAL LOW (ref 4.40–5.90)
RDW: 29 % — ABNORMAL HIGH (ref 11.5–14.5)
WBC: 10.8 10*3/uL — ABNORMAL HIGH (ref 3.8–10.6)

## 2017-09-27 LAB — COMPREHENSIVE METABOLIC PANEL
ALBUMIN: 2.9 g/dL — AB (ref 3.5–5.0)
ALK PHOS: 109 U/L (ref 38–126)
ALT: 113 U/L — ABNORMAL HIGH (ref 17–63)
ANION GAP: 11 (ref 5–15)
AST: 414 U/L — ABNORMAL HIGH (ref 15–41)
BILIRUBIN TOTAL: 26.2 mg/dL — AB (ref 0.3–1.2)
BUN: 15 mg/dL (ref 6–20)
CALCIUM: 8.6 mg/dL — AB (ref 8.9–10.3)
CO2: 22 mmol/L (ref 22–32)
Chloride: 103 mmol/L (ref 101–111)
Creatinine, Ser: <0.3 mg/dL — ABNORMAL LOW (ref 0.61–1.24)
GLUCOSE: 97 mg/dL (ref 65–99)
Potassium: 5.7 mmol/L — ABNORMAL HIGH (ref 3.5–5.1)
Sodium: 136 mmol/L (ref 135–145)
TOTAL PROTEIN: 6.4 g/dL — AB (ref 6.5–8.1)

## 2017-09-27 LAB — PROTIME-INR
INR: 1.98
Prothrombin Time: 22.3 seconds — ABNORMAL HIGH (ref 11.4–15.2)

## 2017-09-27 LAB — AMMONIA: Ammonia: 73 umol/L — ABNORMAL HIGH (ref 9–35)

## 2017-09-27 LAB — LIPASE, BLOOD: Lipase: 128 U/L — ABNORMAL HIGH (ref 11–51)

## 2017-09-27 LAB — ETHANOL: ALCOHOL ETHYL (B): 69 mg/dL — AB (ref <10)

## 2017-09-27 MED ORDER — DIGOXIN 0.25 MG/ML IJ SOLN
0.5000 mg | Freq: Once | INTRAMUSCULAR | Status: AC
  Administered 2017-09-28: 0.5 mg via INTRAVENOUS
  Filled 2017-09-27 (×2): qty 2

## 2017-09-27 MED ORDER — PENTOXIFYLLINE ER 400 MG PO TBCR
400.0000 mg | EXTENDED_RELEASE_TABLET | Freq: Three times a day (TID) | ORAL | Status: DC
  Administered 2017-09-28 – 2017-10-03 (×15): 400 mg via ORAL
  Filled 2017-09-27 (×19): qty 1

## 2017-09-27 MED ORDER — LORAZEPAM 2 MG/ML IJ SOLN
2.0000 mg | Freq: Once | INTRAMUSCULAR | Status: AC
  Administered 2017-09-27: 2 mg via INTRAVENOUS
  Filled 2017-09-27: qty 1

## 2017-09-27 MED ORDER — HYDROCODONE-ACETAMINOPHEN 5-325 MG PO TABS
1.0000 | ORAL_TABLET | ORAL | Status: DC | PRN
  Filled 2017-09-27: qty 2

## 2017-09-27 MED ORDER — PANTOPRAZOLE SODIUM 40 MG PO TBEC
40.0000 mg | DELAYED_RELEASE_TABLET | Freq: Two times a day (BID) | ORAL | Status: DC
  Administered 2017-09-28 – 2017-10-03 (×12): 40 mg via ORAL
  Filled 2017-09-27 (×12): qty 1

## 2017-09-27 MED ORDER — LORAZEPAM 2 MG/ML IJ SOLN: 1.0000 mg | Freq: Four times a day (QID) | INTRAMUSCULAR | Status: AC | PRN

## 2017-09-27 MED ORDER — ONDANSETRON HCL 4 MG PO TABS: 4.0000 mg | ORAL_TABLET | Freq: Four times a day (QID) | ORAL | Status: DC | PRN

## 2017-09-27 MED ORDER — ACETAMINOPHEN 650 MG RE SUPP: 650.0000 mg | Freq: Four times a day (QID) | RECTAL | Status: DC | PRN

## 2017-09-27 MED ORDER — ONDANSETRON HCL 4 MG/2ML IJ SOLN: 4.0000 mg | Freq: Four times a day (QID) | INTRAMUSCULAR | Status: DC | PRN

## 2017-09-27 MED ORDER — SODIUM CHLORIDE 0.9% FLUSH
3.0000 mL | Freq: Two times a day (BID) | INTRAVENOUS | Status: DC
  Administered 2017-09-28 – 2017-10-02 (×10): 3 mL via INTRAVENOUS

## 2017-09-27 MED ORDER — SODIUM CHLORIDE 0.9% FLUSH
3.0000 mL | INTRAVENOUS | Status: DC | PRN
  Administered 2017-09-29 – 2017-09-30 (×3): 3 mL via INTRAVENOUS
  Filled 2017-09-27 (×3): qty 3

## 2017-09-27 MED ORDER — FUROSEMIDE 10 MG/ML IJ SOLN
40.0000 mg | Freq: Once | INTRAMUSCULAR | Status: AC
  Administered 2017-09-28: 40 mg via INTRAVENOUS
  Filled 2017-09-27: qty 4

## 2017-09-27 MED ORDER — SODIUM CHLORIDE 0.9% FLUSH: 3.0000 mL | Freq: Two times a day (BID) | INTRAVENOUS | Status: DC

## 2017-09-27 MED ORDER — FUROSEMIDE 10 MG/ML IJ SOLN: 20.0000 mg | Freq: Every day | INTRAMUSCULAR | Status: DC

## 2017-09-27 MED ORDER — FOLIC ACID 1 MG PO TABS
1.0000 mg | ORAL_TABLET | Freq: Every day | ORAL | Status: DC
  Administered 2017-09-28 – 2017-10-03 (×6): 1 mg via ORAL
  Filled 2017-09-27 (×6): qty 1

## 2017-09-27 MED ORDER — VITAMIN B-1 100 MG PO TABS
100.0000 mg | ORAL_TABLET | Freq: Every day | ORAL | Status: DC
  Administered 2017-09-28 – 2017-10-03 (×6): 100 mg via ORAL
  Filled 2017-09-27 (×6): qty 1

## 2017-09-27 MED ORDER — LACTULOSE 10 GM/15ML PO SOLN
30.0000 g | Freq: Three times a day (TID) | ORAL | Status: DC
  Administered 2017-09-27 – 2017-09-28 (×4): 30 g via ORAL
  Filled 2017-09-27 (×3): qty 60

## 2017-09-27 MED ORDER — SODIUM POLYSTYRENE SULFONATE 15 GM/60ML PO SUSP
ORAL | Status: AC
  Administered 2017-09-27: 30 g via ORAL
  Filled 2017-09-27: qty 120

## 2017-09-27 MED ORDER — FERROUS SULFATE 325 (65 FE) MG PO TABS
325.0000 mg | ORAL_TABLET | Freq: Every day | ORAL | Status: DC
  Administered 2017-09-28 – 2017-10-03 (×6): 325 mg via ORAL
  Filled 2017-09-27 (×6): qty 1

## 2017-09-27 MED ORDER — LORAZEPAM 1 MG PO TABS
1.0000 mg | ORAL_TABLET | Freq: Four times a day (QID) | ORAL | Status: AC | PRN
  Administered 2017-09-28: 1 mg via ORAL
  Filled 2017-09-27: qty 1

## 2017-09-27 MED ORDER — SODIUM POLYSTYRENE SULFONATE 15 GM/60ML PO SUSP
30.0000 g | Freq: Once | ORAL | Status: AC
  Administered 2017-09-27: 30 g via ORAL

## 2017-09-27 MED ORDER — LACTULOSE 10 GM/15ML PO SOLN
ORAL | Status: AC
  Administered 2017-09-27: 30 g via ORAL
  Filled 2017-09-27: qty 60

## 2017-09-27 MED ORDER — ADULT MULTIVITAMIN W/MINERALS CH
1.0000 | ORAL_TABLET | Freq: Every day | ORAL | Status: DC
  Administered 2017-09-28 – 2017-10-03 (×6): 1 via ORAL
  Filled 2017-09-27 (×6): qty 1

## 2017-09-27 MED ORDER — ACETAMINOPHEN 325 MG PO TABS: 650.0000 mg | ORAL_TABLET | Freq: Four times a day (QID) | ORAL | Status: DC | PRN

## 2017-09-27 MED ORDER — SODIUM CHLORIDE 0.9 % IV SOLN: 250.0000 mL | INTRAVENOUS | Status: DC | PRN

## 2017-09-27 MED ORDER — FUROSEMIDE 20 MG PO TABS: 20.0000 mg | ORAL_TABLET | Freq: Every day | ORAL | Status: DC

## 2017-09-27 MED ORDER — BENZONATATE 100 MG PO CAPS: 100.0000 mg | ORAL_CAPSULE | Freq: Two times a day (BID) | ORAL | Status: DC | PRN

## 2017-09-27 MED ORDER — TRAZODONE HCL 50 MG PO TABS
25.0000 mg | ORAL_TABLET | Freq: Every evening | ORAL | Status: DC | PRN
  Administered 2017-09-30: 25 mg via ORAL
  Filled 2017-09-27 (×2): qty 1

## 2017-09-27 NOTE — H&P (Addendum)
[facility] NAME: Craig Maldonado    MR#:  [id number]  DATE OF BIRTH:  07/03/1951  DATE OF ADMISSION:  09/27/2017  PRIMARY CARE PHYSICIAN: Lucilla Lame, MD   REQUESTING/REFERRING PHYSICIAN:   CHIEF COMPLAINT:   Chief Complaint  Patient presents with  . Other    Abdominal Distension    HISTORY OF PRESENT ILLNESS: Craig Maldonado  is a 66 y.o. 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  Patient evaluated in the emergency room, patient in no apparent distress, resting comfortably in bed, patient is brightly awake/alert, oriented x3, denies any complaints, denies pain, patient is now being admitted for acute hyperkalemia, acute on chronic transaminitis secondary to chronic alcoholic liver cirrhosis.  Poor home situation and discussion with associated hospitalist, lives in home with enabling family members that also have problems with alcohol abuse.  PAST MEDICAL HISTORY:   Past Medical History:  Diagnosis Date  . Alcohol abuse   . Alcoholic cirrhosis ([place])   . Gastric varices     PAST SURGICAL HISTORY:  Past Surgical History:  Procedure Laterality Date  . right leg surgery      SOCIAL HISTORY:  Social History   Tobacco Use  . Smoking status: Current Every Day Smoker    Packs/day: 0.50  . Smokeless tobacco: Never Used  Substance Use Topics  .  Alcohol use: Yes    Alcohol/week: 3.6 oz    Types: 6 Cans of beer per week    FAMILY HISTORY:  Family History  Problem Relation Age of Onset  . Healthy Mother   . Tuberculosis Father     DRUG ALLERGIES:  Allergies  Allergen Reactions  . Codeine Nausea And Vomiting    REVIEW OF SYSTEMS:   CONSTITUTIONAL: No fever, fatigue or weakness.  EYES: No blurred or double vision.  EARS, NOSE, AND THROAT: No tinnitus or ear pain.  RESPIRATORY: No cough, shortness of breath, wheezing or hemoptysis.  CARDIOVASCULAR: No chest pain, orthopnea, edema.  GASTROINTESTINAL: No nausea, vomiting, diarrhea or abdominal pain.  GENITOURINARY: No dysuria, hematuria.  ENDOCRINE: No polyuria, nocturia,  HEMATOLOGY: No anemia, easy bruising or bleeding SKIN: No rash or lesion. MUSCULOSKELETAL: No joint pain or arthritis.   NEUROLOGIC: No tingling, numbness, weakness.  PSYCHIATRY: No anxiety or depression.   MEDICATIONS AT HOME:  Prior to Admission medications   Medication Sig Start Date End Date Taking? Authorizing Provider  benzonatate (TESSALON) 200 MG capsule Take 1 capsule (200 mg total) 3 (three) times daily as needed by mouth for cough. Patient not taking: Reported on 09/27/2017 09/25/17   Henreitta Leber, MD  ferrous sulfate 325 (65 FE) MG tablet Take 1 tablet (325 mg total) daily with breakfast by mouth. Patient not taking: Reported on 09/27/2017 09/25/17   Henreitta Leber, MD  folic acid (FOLVITE) 1 MG tablet Take 1 tablet (1 mg total) daily by mouth. Patient not taking: Reported on 09/27/2017 09/25/17 10/25/17  Henreitta Leber, MD  Multiple  Vitamin (MULTIVITAMIN WITH MINERALS) TABS tablet Take 1 tablet by mouth daily. Patient not taking: Reported on 09/24/2017 02/14/16   Nicholes Mango, MD  pantoprazole (PROTONIX) 40 MG tablet Take 1 tablet (40 mg total) 2 (two) times daily by mouth. Patient not taking: Reported on 09/27/2017 09/25/17   Henreitta Leber, MD  pentoxifylline (TRENTAL) 400 MG CR tablet  Take 1 tablet (400 mg total) 3 (three) times daily with meals by mouth. Patient not taking: Reported on 09/27/2017 09/25/17 10/25/17  Henreitta Leber, MD  thiamine 100 MG tablet Take 1 tablet (100 mg total) daily by mouth. Patient not taking: Reported on 09/27/2017 09/25/17   Henreitta Leber, MD      PHYSICAL EXAMINATION:   VITAL SIGNS: Blood pressure 106/78, pulse 91, temperature 97.7 F (36.5 C), temperature source Oral, resp. rate (!) 36, height 5\' 11"  (1.803 m), weight 97.5 kg (215 lb), SpO2 96 %.  GENERAL:  66 y.o.-year-old patient lying in the bed with no acute distress.  Obese, nontoxic-appearing EYES: Pupils equal, round, reactive to light and accommodation. No scleral icterus. Extraocular muscles intact.  HEENT: Head atraumatic, normocephalic. Oropharynx and nasopharynx clear.  NECK:  Supple, no jugular venous distention. No thyroid enlargement, no tenderness.  LUNGS: Normal breath sounds bilaterally, no wheezing, rales,rhonchi or crepitation. No use of accessory muscles of respiration.  CARDIOVASCULAR: S1, S2 normal. No murmurs, rubs, or gallops.  ABDOMEN: Soft, nontender, distended. Bowel sounds present. No organomegaly or mass.  EXTREMITIES: Bilateral lower extremity edema, cyanosis, or clubbing.  NEUROLOGIC: Cranial nerves II through XII are intact. Muscle strength 5/5 in all extremities. Sensation intact. Gait not checked.  PSYCHIATRIC: The patient is alert and oriented x 3.  SKIN: No obvious rash, lesion, or ulcer.   LABORATORY PANEL:   CBC Recent Labs  Lab 09/21/17 0451 09/21/17 1927 09/22/17 0456 09/23/17 0527 09/24/17 0509 09/25/17 0346 09/27/17 1513  WBC 10.9*  --  7.8 6.6 8.5  --  10.8*  HGB 6.3* 7.9* 7.7* 7.5* 8.1* 7.5* 9.0*  HCT 19.5* 24.1* 23.9* 23.7* 25.7*  --  28.3*  PLT 137*  --  126* 116* 122*  --  179  MCV 79.8*  --  80.1 81.1 82.5  --  85.8  MCH 25.9*  --  25.8* 25.6* 26.0  --  27.5  MCHC 32.5  --  32.2 31.5* 31.5*  --  32.0  RDW 25.7*  --  23.7*  24.2* 24.8*  --  29.0*   ------------------------------------------------------------------------------------------------------------------  Chemistries  Recent Labs  Lab 09/21/17 0451 09/21/17 0942 09/22/17 0456 09/23/17 0527 09/24/17 0509 09/27/17 1513  NA 129*  --  131* 131* 135 136  K 3.0*  --  4.1 3.9 3.7 5.7*  CL 95*  --  97* 98* 101 103  CO2 28  --  26 26 26 22   GLUCOSE 122*  --  135* 126* 113* 97  BUN 31*  --  32* 28* 20 15  CREATININE 1.85*  --  1.89* 1.43* 1.02 <0.30*  CALCIUM 8.3*  --  8.4* 8.3* 8.3* 8.6*  MG  --  2.0  --   --   --   --   AST  --   --  142* 122* 143* 414*  ALT  --   --  65* 54 57 113*  ALKPHOS  --   --  164* 126 108 109  BILITOT  --   --  17.1* 15.8* 19.6* 26.2*   ------------------------------------------------------------------------------------------------------------------ CrCl cannot be calculated (This lab  value cannot be used to calculate CrCl because it is not a number: <0.30). ------------------------------------------------------------------------------------------------------------------ No results for input(s): TSH, T4TOTAL, T3FREE, THYROIDAB in the last 72 hours.  Invalid input(s): FREET3   Coagulation profile Recent Labs  Lab 09/21/17 1927 09/27/17 1935  INR 1.82 1.98   ------------------------------------------------------------------------------------------------------------------- No results for input(s): DDIMER in the last 72 hours. -------------------------------------------------------------------------------------------------------------------  Cardiac Enzymes No results for input(s): CKMB, TROPONINI, MYOGLOBIN in the last 168 hours.  Invalid input(s): CK ------------------------------------------------------------------------------------------------------------------ Invalid input(s):  POCBNP  ---------------------------------------------------------------------------------------------------------------  Urinalysis    Component Value Date/Time   COLORURINE AMBER (A) 09/22/2017 0212   APPEARANCEUR CLEAR (A) 09/22/2017 0212   APPEARANCEUR CLEAR 12/31/2012 1616   LABSPEC 1.011 09/22/2017 0212   LABSPEC 1.005 12/31/2012 1616   PHURINE 5.0 09/22/2017 0212   GLUCOSEU NEGATIVE 09/22/2017 0212   GLUCOSEU NEGATIVE 12/31/2012 1616   HGBUR NEGATIVE 09/22/2017 0212   BILIRUBINUR SMALL (A) 09/22/2017 0212   BILIRUBINUR NEGATIVE 12/31/2012 1616   KETONESUR NEGATIVE 09/22/2017 0212   PROTEINUR NEGATIVE 09/22/2017 0212   NITRITE POSITIVE (A) 09/22/2017 0212   LEUKOCYTESUR NEGATIVE 09/22/2017 0212   LEUKOCYTESUR NEGATIVE 12/31/2012 1616     RADIOLOGY: No results found.  EKG: Orders placed or performed during the hospital encounter of 09/27/17  . ED EKG  . ED EKG    IMPRESSION AND PLAN: 1.  Acute hyperkalemia Referred to the observation unit Kayexalate p.o. x1 and check BMP in the morning  2. Chronic liver cirrhosis with ascites Stable S/P recent discharge from our hospital 2 days ago, enabling family situation at home with other family members with alcohol abuse history-lives with son, s/p U/S guided paracentesis with 2.6 L of fluid removed last admission/treated with 5 days of IV ceftriaxone empirically for SBP, seen by GI and started on pentoxifylline  Follow-up on abdominal ultrasound is ordered by ED attending, reconsult gastroenterology for continuity of care  3.  Acute probable diastolic CHF ex, mild Lasix 40mg  IV daily, lisinopril/coreg if BP will allow, and continue close medical monitoring Most recent EF was nl 2017  4. Acute on chronic transaminitis  Appears stable  Gastroenterology to see, repeat CMP in the morning   5.  History of GI bleed Resolved-hemoglobin 9, up from 8 at discharge 2 days ago Suspected secondary to upper GI bleed given  patient's melanotic stools, s/p EGD last admission noted for gastritis and duodenal ulcer which was treated.  Continue Protonix  Has appointment with gastroenterology status post discharge in 2 weeks for reevaluation  6. Acute on chronic alcohol abuse Cessation was encouraged We will place on CIWA protocol while in house  Clinically he shows no signs of alcohol withdrawal  7.  Acute hyperammonemia Lactulose 3 times daily, check ammonia level in the morning  Full code Condition guarded Prognosis poor DVT prophylaxis with SCDs/TED hose Disposition home on tomorrow barring any complications   All the records are reviewed and case discussed with ED provider. Management plans discussed with the patient, family and they are in agreement.  CODE STATUS: Code Status History    Date Active Date Inactive Code Status Order ID Comments User Context   09/21/2017 00:50 09/26/2017 19:27 Full Code [id number]  Lance Coon, MD ED   02/11/2016 07:36 02/22/2016 20:28 Full Code [id number]  Loletha Grayer, MD ED       TOTAL TIME TAKING CARE OF THIS PATIENT: 45 minutes.    Avel Peace Shoshannah Faubert M.D on 09/27/2017   Between 7am to 6pm - Pager - [phone]  After  6pm go to [email or web address] - password [email or web address] [facility]  Office  [phone]  CC: Primary care physician; Lucilla Lame, MD   Note: This dictation was prepared with Dragon dictation along with smaller phrase technology. Any transcriptional errors that result from this process are unintentional.

## 2017-09-27 NOTE — ED Triage Notes (Addendum)
Pt c/o increased abdominal distension over last few hours. Pt discharged this AM from hospital for liver cirrhosis. Pt skin yellow, sclera yellow. Pt alert and oriented X4, active, cooperative, pt in NAD. RR even and unlabored. Pt alert to self,situation, place and time but does have moments of confusion.

## 2017-09-27 NOTE — ED Notes (Signed)
Pt continues to take nasal cannula off of nose.

## 2017-09-27 NOTE — Telephone Encounter (Signed)
Thank you. Looks like pt decided to go to the ER due to increased abdominal pain.

## 2017-09-27 NOTE — Telephone Encounter (Signed)
Craig Maldonado from [facility] in [place] left a voice message that she called on the patient today and he wasn't doing very well at all. He refused to go to the hospital when she called EMS and he refused her services today. She will attempt a visit again tomorrow.

## 2017-09-27 NOTE — ED Notes (Signed)
Pt to ultrasound

## 2017-09-27 NOTE — ED Provider Notes (Signed)
[facility] Emergency Department Provider Note ____________________________________________   I have reviewed the triage vital signs and the triage nursing note.  HISTORY  Chief Complaint Other (Abdominal Distension)   Historian Level 5 Caveat History Limited by very poor historian  HPI Craig Maldonado is a 66 y.o. male with a history of alcohol cirrhosis secondary to alcohol abuse, with recent hospitalization for GI bleed, went home yesterday per the hospital note, but patient states he thinks he went home this morning, and then had home health nurse evaluated him and requested to send him back to the ER.  Patient seems somewhat confused.  He apparently lives alone.  Chest pain or shortness of breath.    Past Medical History:  Diagnosis Date  . Alcohol abuse   . Alcoholic cirrhosis ([place])   . Gastric varices     Patient Active Problem List   Diagnosis Date Noted  . Alcoholic cirrhosis of liver with ascites ([place]) 09/20/2017  . Alcohol abuse 09/20/2017  . Hyperbilirubinemia 09/20/2017  . Transaminitis 09/20/2017  . AKI (acute kidney injury) ([place]) 09/20/2017  . Gastrointestinal hemorrhage associated with anorectal source   . GI bleed 02/11/2016  . Blood in stool   . Esophageal varices without bleeding ([place])   . Esophagogastric ulcer   . Duodenitis   . Cardiac murmur 04/19/2015  . Back pain, chronic 04/19/2015  . Arthralgia of hip 04/19/2015  . Leg pain 04/19/2015  . Current tobacco use 04/19/2015  . Blood glucose elevated 04/19/2015  . Elevated blood sugar 04/19/2015  . Abnormal prostate specific antigen 04/19/2015  . Excoriated eczema 04/19/2015  . Breast development in males 04/19/2015  . Benign hypertension 04/19/2015  . Alcohol induced liver disorder ([place]) 04/19/2015  . Benign prostatic hypertrophy without urinary obstruction 04/19/2015  . Screening for depression 04/19/2015  . Cigarette smoker 04/19/2015  . Routine general medical  examination at a health care facility 04/19/2015    Past Surgical History:  Procedure Laterality Date  . right leg surgery      Prior to Admission medications   Medication Sig Start Date End Date Taking? Authorizing Provider  benzonatate (TESSALON) 200 MG capsule Take 1 capsule (200 mg total) 3 (three) times daily as needed by mouth for cough. Patient not taking: Reported on 09/27/2017 09/25/17   Henreitta Leber, MD  ferrous sulfate 325 (65 FE) MG tablet Take 1 tablet (325 mg total) daily with breakfast by mouth. Patient not taking: Reported on 09/27/2017 09/25/17   Henreitta Leber, MD  folic acid (FOLVITE) 1 MG tablet Take 1 tablet (1 mg total) daily by mouth. Patient not taking: Reported on 09/27/2017 09/25/17 10/25/17  Henreitta Leber, MD  Multiple Vitamin (MULTIVITAMIN WITH MINERALS) TABS tablet Take 1 tablet by mouth daily. Patient not taking: Reported on 09/24/2017 02/14/16   Nicholes Mango, MD  pantoprazole (PROTONIX) 40 MG tablet Take 1 tablet (40 mg total) 2 (two) times daily by mouth. Patient not taking: Reported on 09/27/2017 09/25/17   Henreitta Leber, MD  pentoxifylline (TRENTAL) 400 MG CR tablet Take 1 tablet (400 mg total) 3 (three) times daily with meals by mouth. Patient not taking: Reported on 09/27/2017 09/25/17 10/25/17  Henreitta Leber, MD  thiamine 100 MG tablet Take 1 tablet (100 mg total) daily by mouth. Patient not taking: Reported on 09/27/2017 09/25/17   Henreitta Leber, MD    Allergies  Allergen Reactions  . Codeine Nausea And Vomiting    Family History  Problem Relation Age  of Onset  . Healthy Mother   . Tuberculosis Father     Social History Social History   Tobacco Use  . Smoking status: Current Every Day Smoker    Packs/day: 0.50  . Smokeless tobacco: Never Used  Substance Use Topics  . Alcohol use: Yes    Alcohol/week: 3.6 oz    Types: 6 Cans of beer per week  . Drug use: No    Review of Systems  Constitutional: Negative for fever. Eyes:  Negative for visual changes. ENT: Negative for sore throat. Cardiovascular: Negative for chest pain. Respiratory: Negative for shortness of breath. Gastrointestinal: He does have nausea and abdominal pain, pointing to his upper abdomen. Genitourinary: Negative for dysuria. Musculoskeletal: Negative for back pain. Skin: Negative for rash. Neurological: Negative for headache.  ____________________________________________   PHYSICAL EXAM:  VITAL SIGNS: ED Triage Vitals [09/27/17 1510]  Enc Vitals Group     BP 112/76     Pulse      Resp 16     Temp 97.7 F (36.5 C)     Temp Source Oral     SpO2 90 %     Weight 215 lb (97.5 kg)     Height 5\' 11"  (1.803 m)     Head Circumference      Peak Flow      Pain Score 8     Pain Loc      Pain Edu?      Excl. in [employer]?      Constitutional: Alert and poor historian, possibly confused. Well appearing and in no distress. HEENT   Head: Normocephalic and atraumatic.      Eyes: Conjunctivae are normal. Pupils equal and round.       Ears:         Nose: No congestion/rhinnorhea.   Mouth/Throat: Mucous membranes are moist.   Neck: No stridor. Cardiovascular/Chest: Normal rate, regular rhythm.  No murmurs, rubs, or gallops. Respiratory: Normal respiratory effort without tachypnea nor retractions. Breath sounds are clear and equal bilaterally. No wheezes/rales/rhonchi. Gastrointestinal: Soft. No distention, no guarding, no rebound.  Obese.  Mild diffuse abdominal tenderness, not tense or tight abdomen. Genitourinary/rectal:Deferred Musculoskeletal: Nontender with normal range of motion in all extremities. No joint effusions.  No lower extremity tenderness.  No edema. Neurologic: No facial droop.  Patient is a bit of a poor historian and seems confused, but no slurred speech.  No gross or focal neurologic deficits are appreciated. Skin:  Skin is warm, dry and intact. No rash noted. Psychiatric: No  agitation.   ____________________________________________  LABS (pertinent positives/negatives) I, Lisa Roca, MD the attending physician have reviewed the labs noted below.  Labs Reviewed  LIPASE, BLOOD - Abnormal; Notable for the following components:      Result Value   Lipase 128 (*)    All other components within normal limits  COMPREHENSIVE METABOLIC PANEL - Abnormal; Notable for the following components:   Potassium 5.7 (*)    Creatinine, Ser <0.30 (*)    Calcium 8.6 (*)    Total Protein 6.4 (*)    Albumin 2.9 (*)    AST 414 (*)    ALT 113 (*)    Total Bilirubin 26.2 (*)    All other components within normal limits  CBC - Abnormal; Notable for the following components:   WBC 10.8 (*)    RBC 3.29 (*)    Hemoglobin 9.0 (*)    HCT 28.3 (*)    RDW 29.0 (*)  All other components within normal limits  AMMONIA - Abnormal; Notable for the following components:   Ammonia 73 (*)    All other components within normal limits  PROTIME-INR - Abnormal; Notable for the following components:   Prothrombin Time 22.3 (*)    All other components within normal limits  ETHANOL - Abnormal; Notable for the following components:   Alcohol, Ethyl (B) 69 (*)    All other components within normal limits  CULTURE, BLOOD (ROUTINE X 2)  CULTURE, BLOOD (ROUTINE X 2)  URINALYSIS, COMPLETE (UACMP) WITH MICROSCOPIC    ____________________________________________    EKG I, Lisa Roca, MD, the attending physician have personally viewed and interpreted all ECGs.  115 bpm.  Undetermined rhythm, suspect sinus tach with first-degree AV block.  No icterus.  Normal axis.  Nonspecific ST and T wave ____________________________________________  RADIOLOGY All Xrays were viewed by me.  Imaging interpreted by Radiologist, and I, Lisa Roca, MD the attending physician have reviewed the radiologist interpretation noted below.  Right upper quadrant ultrasound:  Pending __________________________________________  PROCEDURES  Procedure(s) performed: None  Critical Care performed: None   ____________________________________________  ED COURSE / ASSESSMENT AND PLAN  Pertinent labs & imaging results that were available during my care of the patient were reviewed by me and considered in my medical decision making (see chart for details).  Patient is extremely jaundiced.  He is quite a poor historian, he seems fairly confused given that discharge summary states he was discharged yesterday, patient thinks he was discharged today.  Initially stated he was not sure why he came over here, but then it sounds like there is a home health nurse who saw him today.  I am quite concerned that he would be living on his own in this condition.  No fever.  He says he has persistent nausea.  Laboratory studies show elevated lipase, elevated LFTs and increased bilirubin from just recent discharge.  I am getting an ultrasound of the right upper quadrant.  Concern about an obstructive process.  Previous ultrasound does indicate that it does have a gallbladder.  Initially patient had told me that he thought he did not have a gallbladder.  In any case, I think he needs hospitalization to figure out why he is worsening, and lactulose because his elevated ammonia may be causing the hepatic encephalopathy.  Patient initially refused the ultrasound was stating that he home.  I am concerned about his mental status and about whether or not he is really competent to make that decision.  His heart rate is up to 130.  His blood pressures at 107.  I indicated to him that he has signs of possible obstructive process at the gallbladder, and risk of worsening and death.  Patient does not relate repeat this back to me.  When I tried to get in touch with the emergency contact son, there is no answer at the mobile at home number.  1 of the social work notes from the recent hospitalization  indicated the patient lives with a son, but they were unable to get a hold of him either.  Patient tells me that he does not live with anyone.  In any case, the patient's story is so confusing that I am concerned about his mental status and whether or not he is competent to be making a decision in light of potential life-threatening illness if it is not evaluated and managed.  I was able to discuss with him further about possibility of withdrawal symptoms,  given the tachycardia and patient getting kind of agitated, and he stated he thought he probably was withdrawing.  We will give him Ativan now.  I let them know that I do want to keep him overnight for treatment and although he was initially quite opposed to this, he was then agreeable to be placed in a hospital bed as his secondary concern was that he was uncomfortable in the stretcher.  Discussed with hospitalist for admission.  Ultrasound pending, however suspects that this patient would not be a surgical candidate from the standpoint of his overall medical condition.  Alcohol level slightly elevated.  There is actually withdrawing.  White blood cell count is slightly elevated to 10 from 8, but this may be a little hemoconcentrated.  On the other hand his acute renal failure had resolved and his BUN and creatinine are okay.  He is not reporting fevers.  He is not reporting urinary symptoms.  He is not reporting abdominal pain and I am not at this point suspicious for SBP.  I am going to go ahead and add blood cultures and chest x-ray to be complete in terms of the tachycardia being any sort of indicator for infectious condition or sepsis.  Will defer any antibiotic coverage to hospitalist team.  DIFFERENTIAL DIAGNOSIS: Differential diagnosis includes, but is not limited to, alcohol, illicit or prescription medications, or other toxic ingestion; intracranial pathology such as stroke or intracerebral hemorrhage; fever or infectious causes  including sepsis; hypoxemia and/or hypercarbia; uremia; trauma; endocrine related disorders such as diabetes, hypoglycemia, and thyroid-related diseases; hypertensive encephalopathy; etc.   CONSULTATIONS: Hospitalist for admission, Dr. Verdell Carmine.   Patient / Family / Caregiver informed of clinical course, medical decision-making process, and agree with plan.     ___________________________________________  ED Discharge Orders    None      ___________________________________________   FINAL CLINICAL IMPRESSION(S) / ED DIAGNOSES   Final diagnoses:  LFTs abnormal  Confusion  Generalized weakness              Note: This dictation was prepared with Dragon dictation. Any transcriptional errors that result from this process are unintentional    Lisa Roca, MD 09/27/17 2018

## 2017-09-27 NOTE — ED Notes (Signed)
2L [place] applied to patient, 99% on 2L.

## 2017-09-27 NOTE — ED Notes (Signed)
Pt given meal tray and water. Pt continues to refuse to urinate. Pt stating that he wants to "put my shoes on and get the hell out of here". Will notify EDP and ask for EDP to update patient.

## 2017-09-27 NOTE — ED Notes (Signed)
Pt taking oxygen off of his nose, placed back by this RN.

## 2017-09-27 NOTE — ED Notes (Signed)
Pt refusing to go to ultrasound at this time. EDP informed by staff

## 2017-09-27 NOTE — ED Notes (Signed)
Pt refusing to provide urine sample. Pt states he wants to go home. Turned on TV for patient. Side rails up X 2. Pt in NAD.

## 2017-09-27 NOTE — ED Notes (Signed)
ED Provider at bedside. 

## 2017-09-27 NOTE — ED Notes (Addendum)
Requested patient to attempt to urinate, refused. Pt continues to take nasal cannula off nose.

## 2017-09-28 ENCOUNTER — Observation Stay: Payer: Medicare Other

## 2017-09-28 DIAGNOSIS — K7031 Alcoholic cirrhosis of liver with ascites: Secondary | ICD-10-CM | POA: Diagnosis not present

## 2017-09-28 DIAGNOSIS — K7011 Alcoholic hepatitis with ascites: Secondary | ICD-10-CM

## 2017-09-28 DIAGNOSIS — R17 Unspecified jaundice: Secondary | ICD-10-CM

## 2017-09-28 DIAGNOSIS — K7200 Acute and subacute hepatic failure without coma: Secondary | ICD-10-CM

## 2017-09-28 DIAGNOSIS — R188 Other ascites: Secondary | ICD-10-CM | POA: Diagnosis not present

## 2017-09-28 LAB — COMPREHENSIVE METABOLIC PANEL
ALK PHOS: 109 U/L (ref 38–126)
ALT: 108 U/L — AB (ref 17–63)
AST: 330 U/L — AB (ref 15–41)
Albumin: 2.9 g/dL — ABNORMAL LOW (ref 3.5–5.0)
Anion gap: 9 (ref 5–15)
BILIRUBIN TOTAL: 26.3 mg/dL — AB (ref 0.3–1.2)
BUN: 18 mg/dL (ref 6–20)
CHLORIDE: 105 mmol/L (ref 101–111)
CO2: 24 mmol/L (ref 22–32)
CREATININE: UNDETERMINED mg/dL (ref 0.61–1.24)
Calcium: 8.5 mg/dL — ABNORMAL LOW (ref 8.9–10.3)
Glucose, Bld: 118 mg/dL — ABNORMAL HIGH (ref 65–99)
Potassium: 3.7 mmol/L (ref 3.5–5.1)
SODIUM: 138 mmol/L (ref 135–145)
Total Protein: 6.3 g/dL — ABNORMAL LOW (ref 6.5–8.1)

## 2017-09-28 LAB — GLUCOSE, CAPILLARY: GLUCOSE-CAPILLARY: 118 mg/dL — AB (ref 65–99)

## 2017-09-28 LAB — ACETAMINOPHEN LEVEL: Acetaminophen (Tylenol), Serum: <10 ug/mL — ABNORMAL LOW (ref 10–30)

## 2017-09-28 LAB — URINE DRUG SCREEN, QUALITATIVE (ARMC ONLY)
AMPHETAMINES, UR SCREEN: NOT DETECTED
BARBITURATES, UR SCREEN: NOT DETECTED
BENZODIAZEPINE, UR SCRN: NOT DETECTED
CANNABINOID 50 NG, UR ~~LOC~~: POSITIVE — AB
Cocaine Metabolite,Ur ~~LOC~~: NOT DETECTED
MDMA (Ecstasy)Ur Screen: NOT DETECTED
Methadone Scn, Ur: NOT DETECTED
OPIATE, UR SCREEN: POSITIVE — AB
PHENCYCLIDINE (PCP) UR S: NOT DETECTED
Tricyclic, Ur Screen: NOT DETECTED

## 2017-09-28 LAB — AMMONIA: Ammonia: 53 umol/L — ABNORMAL HIGH (ref 9–35)

## 2017-09-28 LAB — PROTIME-INR
INR: 2.12
PROTHROMBIN TIME: 23.6 s — AB (ref 11.4–15.2)

## 2017-09-28 LAB — BRAIN NATRIURETIC PEPTIDE: B Natriuretic Peptide: 458 pg/mL — ABNORMAL HIGH (ref 0.0–100.0)

## 2017-09-28 MED ORDER — ALBUMIN HUMAN 25 % IV SOLN
50.0000 g | Freq: Once | INTRAVENOUS | Status: DC
  Filled 2017-09-28: qty 200

## 2017-09-28 MED ORDER — CARVEDILOL 6.25 MG PO TABS: 3.1250 mg | ORAL_TABLET | Freq: Two times a day (BID) | ORAL | Status: DC

## 2017-09-28 MED ORDER — FUROSEMIDE 10 MG/ML IJ SOLN
40.0000 mg | Freq: Every day | INTRAMUSCULAR | Status: DC
  Administered 2017-09-28 – 2017-09-30 (×3): 40 mg via INTRAVENOUS
  Filled 2017-09-28 (×4): qty 4

## 2017-09-28 MED ORDER — ORAL CARE MOUTH RINSE
15.0000 mL | Freq: Two times a day (BID) | OROMUCOSAL | Status: DC
  Administered 2017-09-28 – 2017-10-02 (×8): 15 mL via OROMUCOSAL

## 2017-09-28 MED ORDER — FUROSEMIDE 40 MG PO TABS: 40.0000 mg | ORAL_TABLET | Freq: Every day | ORAL | Status: DC

## 2017-09-28 MED ORDER — LISINOPRIL 2.5 MG PO TABS
2.5000 mg | ORAL_TABLET | Freq: Every day | ORAL | Status: DC
  Filled 2017-09-28: qty 1

## 2017-09-28 MED ORDER — METOPROLOL SUCCINATE ER 25 MG PO TB24
25.0000 mg | ORAL_TABLET | Freq: Every day | ORAL | Status: DC
  Administered 2017-09-28: 25 mg via ORAL
  Filled 2017-09-28: qty 1

## 2017-09-28 NOTE — Consult Note (Signed)
Craig Maldonado , MD [address], [place], [place], [place], [address] [address], [place], [place], [place], [address] Phone: [phone]  Fax: [phone]  Consultation  Referring Provider: Dr Darvin Neighbours  Primary Care Physician:   Primary Gastroenterologist: None  Reason for Consultation:     Abnormal LFT's  Date of Admission:  09/27/2017 Date of Consultation:  09/28/2017         HPI:   Craig Maldonado is a 66 y.o. male with a history of decompensated alcoholic liver disease, Toni Arthurs  , portal hypertensive gastropathy ,ascites, esophageal varices seen on EGD in 01/2016. He was admitted and seen by Dr Alice Reichert on 09/21/17 . H/o alcohol abuse .   He had an EGD on 09/23/17 who noted Grade A esophagitis, gastritis, a duodenal ulcer with visible vessel which was injected and treated with coagulation. During admission he was noted to be in AKI which improved. T bilirubin was 19.6 with INR of 1.8, commenced on pentoxifylline. He was discharged . He had received 2 units of PRBC's. He underwent paracentesis but fluid was not sent for analysis , discharged on 09/26/17 . He returned to the ER on 09/27/17 with confusion, elevated LFT's , tachycardia , elevated blood alcohol level. T bilirubin elevated to 26 ,INR 1.98 . RUQ USG showed no biliary dilation, cirrhosis, ascites, likely no flow in the portal vein.  Past Medical History:  Diagnosis Date  . Alcohol abuse   . Alcoholic cirrhosis ([place])   . Gastric varices     Past Surgical History:  Procedure Laterality Date  . right leg surgery      Prior to Admission medications   Medication Sig Start Date End Date Taking? Authorizing Provider  benzonatate (TESSALON) 200 MG capsule Take 1 capsule (200 mg total) 3 (three) times daily as needed by mouth for cough. Patient not taking: Reported on 09/27/2017 09/25/17   Henreitta Leber, MD  ferrous sulfate 325 (65 FE) MG tablet Take 1 tablet (325 mg total) daily with breakfast by mouth. Patient not taking:  Reported on 09/27/2017 09/25/17   Henreitta Leber, MD  folic acid (FOLVITE) 1 MG tablet Take 1 tablet (1 mg total) daily by mouth. Patient not taking: Reported on 09/27/2017 09/25/17 10/25/17  Henreitta Leber, MD  Multiple Vitamin (MULTIVITAMIN WITH MINERALS) TABS tablet Take 1 tablet by mouth daily. Patient not taking: Reported on 09/24/2017 02/14/16   Nicholes Mango, MD  pantoprazole (PROTONIX) 40 MG tablet Take 1 tablet (40 mg total) 2 (two) times daily by mouth. Patient not taking: Reported on 09/27/2017 09/25/17   Henreitta Leber, MD  pentoxifylline (TRENTAL) 400 MG CR tablet Take 1 tablet (400 mg total) 3 (three) times daily with meals by mouth. Patient not taking: Reported on 09/27/2017 09/25/17 10/25/17  Henreitta Leber, MD  thiamine 100 MG tablet Take 1 tablet (100 mg total) daily by mouth. Patient not taking: Reported on 09/27/2017 09/25/17   Henreitta Leber, MD    Family History  Problem Relation Age of Onset  . Healthy Mother   . Tuberculosis Father      Social History   Tobacco Use  . Smoking status: Current Every Day Smoker    Packs/day: 0.50  . Smokeless tobacco: Never Used  Substance Use Topics  . Alcohol use: Yes    Alcohol/week: 3.6 oz    Types: 6 Cans of beer per week  . Drug use: No    Allergies as of 09/27/2017 - Review Complete  09/27/2017  Allergen Reaction Noted  . Codeine Nausea And Vomiting 04/19/2015    Review of Systems:    All systems reviewed and negative except where noted in HPI.   Physical Exam:  Vital signs in last 24 hours: Temp:  [97.7 F (36.5 C)-98.2 F (36.8 C)] 98.2 F (36.8 C) (11/08 2312) Pulse Rate:  [90-132] 132 (11/08 2312) Resp:  [16-36] 22 (11/08 2312) BP: (92-112)/(59-78) 101/69 (11/08 2312) SpO2:  [88 %-98 %] 98 % (11/08 2312) Weight:  [215 lb (97.5 kg)-229 lb 6.4 oz (104.1 kg)] 229 lb 6.4 oz (104.1 kg) (11/09 0500) Last BM Date: 09/28/17 General:  Awake but not responding to any questions  Head:  Normocephalic and  atraumatic. Eyes:   ++icterus.   Conjunctiva pink. PERRLA. Ears:  Unable to assess  Neck:  Supple; no masses or thyroidomegaly Lungs: Respirations even and unlabored. B/l scattered ronchi Heart:  Regular rate and rhythm; systolic murmur heart over the pulmonary area Abdomen:  Soft, nondistended, nontender. Normal bowel sounds. No appreciable masses or hepatomegaly.  No rebound or guarding.  Neurologic:  Alert and oriented x0; . Skin:  Multiple bruises over extremeties Cervical Nodes:  No significant cervical adenopathy. Psych:unable to assess   LAB RESULTS: Recent Labs    09/27/17 1513  WBC 10.8*  HGB 9.0*  HCT 28.3*  PLT 179   BMET Recent Labs    09/27/17 1513 09/28/17 0501  NA 136 138  K 5.7* 3.7  CL 103 105  CO2 22 24  GLUCOSE 97 118*  BUN 15 18  CREATININE <0.30* UNABLE TO REPORT DUE TO ICTERUS INTERFERENCE  CALCIUM 8.6* 8.5*   LFT Recent Labs    09/28/17 0501  PROT 6.3*  ALBUMIN 2.9*  AST 330*  ALT 108*  ALKPHOS 109  BILITOT 26.3*   PT/INR Recent Labs    09/27/17 1935  LABPROT 22.3*  INR 1.98    STUDIES: Dg Chest Port 1 View  Result Date: 09/27/2017 CLINICAL DATA:  Initial evaluation for acute tachycardia. EXAM: PORTABLE CHEST 1 VIEW COMPARISON:  Prior radiograph from 09/23/2017. FINDINGS: Stable cardiomegaly. Allowing for technique, mediastinal silhouette within normal limits. Lungs hypoinflated. Diffuse vascular congestion with interstitial prominence, suggesting mild pulmonary interstitial edema. Small right pleural effusion. Associated streaky right basilar opacities slightly improved from prior, which may reflect improved atelectasis or infiltrate. No new focal airspace disease. No pneumothorax. No acute osseus abnormality. IMPRESSION: 1. Cardiomegaly with mild diffuse pulmonary interstitial congestion/edema. 2. Persistent small right pleural effusion. Slightly improved right basilar opacity as compared to previous, which may reflect improved  atelectasis or infiltrate. Electronically Signed   By: Jeannine Boga M.D.   On: 09/27/2017 20:55   US Abdomen Limited Ruq  Result Date: 09/27/2017 CLINICAL DATA:  Initial evaluation for abnormal LFTs. EXAM: ULTRASOUND ABDOMEN LIMITED RIGHT UPPER QUADRANT COMPARISON:  Prior study from 09/20/2017. FINDINGS: Gallbladder: Echogenic material within the gallbladder lumen suggestive of possible sludge. No frank shadowing echogenic stones. Gallbladder wall mildly thickened to 3.7 mm, suspected be related to ascites. No sonographic Murphy sign elicited on exam. Common bile duct: Diameter: 3.7 mm Liver: Liver demonstrates a coarse heterogeneous and diffusely echogenic echotexture with nodular contour, consistent with cirrhosis. No focal lesions identified. No demonstrable flow is visualized within the main portal vein, similar to previous exam from 09/20/2017. At least moderate volume ascites noted within the visualized abdomen. IMPRESSION: 1. Hepatic cirrhosis with ascites. 2. No demonstrable flow within the main portal vein by sonography, similar to previous exam. Again, follow-up  examination with dedicated cross-sectional imaging could be performed for further evaluation as warranted. 3. Gallbladder sludge. No other sonographic features to suggest acute cholecystitis. No biliary dilatation. Electronically Signed   By: Jeannine Boga M.D.   On: 09/27/2017 21:03      Impression / Plan:   Sahith Nurse is a 66 y.o. y/o male with a history of decompensated alcoholic liver cirrhosis, ascites. Recently discharged with alcoholic hepatitis, bleeding from a duodenal ulcer, Ongoing alcohol use, very poor prognosis.  He is in liver failure and encephelopathic.    Plan  1. Doppler USG liver.  2. Urine drug screen , tylenol level, Acute hepatitis labs ( I will order) 3. IV thiamine to prevent wernicke's encephalopathy  4. Diagnostic and therapeutic abdominal paracentesis to r/o SBP. If draining more than 5  litres administer 50 grams of albumin .  5. Suggest palliative care consult to determine goals of care.   6. Lactulose titrate to 2-3 soft bowel movements a day , avoid diarrhea as dehydration can worsen hepatic encephalopathy. Guide therapy clinically and not based on ammonia levels.  7. Low salt diet .  8. Consider CT head to subdural hematoma if his mental state does not improve.  9. Not a candidate for liver transplant.  10 . Continue pentoxyphylline when he is able to swallow tablets  Thank you for involving me in the care of this patient.      LOS: 0 days   Craig Bellows, MD  09/28/2017, 11:07 AM

## 2017-09-28 NOTE — Care Management Obs Status (Signed)
[facility] NOTIFICATION   Patient Details  Name: Craig Maldonado MRN: [id number] Date of Birth: November 29, 1950   Medicare Observation Status Notification Given:  Yes    Beverly Sessions, RN 09/28/2017, 11:06 AM

## 2017-09-28 NOTE — Progress Notes (Signed)
Attempted to discuss [place] with patient this afternoon. He will wake to voice but remains drowsy throughout the conversation and I question capacity to make medical decisions at this time. No family at bedside and unavailable via telephone. PMT not at [facility] over the weekend but will f/u next week to further discuss [place] with patient and family.   NO CHARGE  Ihor Dow, FNP-C Palliative Medicine Team  Phone: [phone] Fax: [phone]

## 2017-09-28 NOTE — Progress Notes (Signed)
[place] [facility] [place] NAME: Craig Maldonado    MR#:  [id number]  DATE OF BIRTH:  1951/01/24  SUBJECTIVE:   Patient admitted for confusion.  Was recently in the hospital for GI bleed and diagnosed with gastritis and duodenal ulcer.  No further bleeding. Today he is more awake but still mildly confused.  Worsening liver functions.  Afebrile. REVIEW OF SYSTEMS:    Review of Systems  Constitutional: Negative for chills and fever.  HENT: Negative for congestion and tinnitus.   Eyes: Negative for blurred vision and double vision.  Respiratory: Negative for cough, shortness of breath and wheezing.   Cardiovascular: Negative for chest pain, orthopnea and PND.  Gastrointestinal: Negative for abdominal pain, diarrhea, nausea and vomiting.  Genitourinary: Negative for dysuria and hematuria.  Musculoskeletal: Positive for joint pain (left hip).  Neurological: Negative for dizziness, sensory change and focal weakness.  All other systems reviewed and are negative.   DRUG ALLERGIES:   Allergies  Allergen Reactions  . Codeine Nausea And Vomiting    VITALS:  Blood pressure 101/69, pulse (!) 132, temperature 98.2 F (36.8 C), temperature source Oral, resp. rate (!) 22, height 5\' 11"  (1.803 m), weight 104.1 kg (229 lb 6.4 oz), SpO2 98 %.  PHYSICAL EXAMINATION:   Physical Exam  GENERAL:  66 y.o.-year-old patient lying in bed in no acute distress.  EYES: Pupils equal, round, reactive to light and accommodation. + scleral icterus. Extraocular muscles intact.  HEENT: Head atraumatic, normocephalic. Oropharynx and nasopharynx clear.  NECK:  Supple, no jugular venous distention. No thyroid enlargement, no tenderness.  LUNGS: Normal breath sounds bilaterally, no wheezing, rales, rhonchi. No use of accessory muscles of respiration.  CARDIOVASCULAR: S1, S2 normal. II/VI SEM at RSB, No rubs, or gallops.  ABDOMEN: Soft, nontender, slightly distended. Bowel sounds  present. No organomegaly or mass.  EXTREMITIES: No cyanosis, clubbing, +1-2 edema b/l NEUROLOGIC: Cranial nerves II through XII are intact. No focal Motor or sensory deficits b/l.  Globally weak PSYCHIATRIC: The patient is alert and awake. SKIN: Icterus   LABORATORY PANEL:   CBC Recent Labs  Lab 09/27/17 1513  WBC 10.8*  HGB 9.0*  HCT 28.3*  PLT 179   ------------------------------------------------------------------------------------------------------------------  Chemistries  Recent Labs  Lab 09/28/17 0501  NA 138  K 3.7  CL 105  CO2 24  GLUCOSE 118*  BUN 18  CREATININE UNABLE TO REPORT DUE TO ICTERUS INTERFERENCE  CALCIUM 8.5*  AST 330*  ALT 108*  ALKPHOS 109  BILITOT 26.3*   ------------------------------------------------------------------------------------------------------------------  Cardiac Enzymes No results for input(s): TROPONINI in the last 168 hours. ------------------------------------------------------------------------------------------------------------------  RADIOLOGY:  Dg Chest Port 1 View  Result Date: 09/27/2017 CLINICAL DATA:  Initial evaluation for acute tachycardia. EXAM: PORTABLE CHEST 1 VIEW COMPARISON:  Prior radiograph from 09/23/2017. FINDINGS: Stable cardiomegaly. Allowing for technique, mediastinal silhouette within normal limits. Lungs hypoinflated. Diffuse vascular congestion with interstitial prominence, suggesting mild pulmonary interstitial edema. Small right pleural effusion. Associated streaky right basilar opacities slightly improved from prior, which may reflect improved atelectasis or infiltrate. No new focal airspace disease. No pneumothorax. No acute osseus abnormality. IMPRESSION: 1. Cardiomegaly with mild diffuse pulmonary interstitial congestion/edema. 2. Persistent small right pleural effusion. Slightly improved right basilar opacity as compared to previous, which may reflect improved atelectasis or infiltrate.  Electronically Signed   By: Jeannine Boga M.D.   On: 09/27/2017 20:55   US Abdomen Limited Ruq  Result Date: 09/27/2017 CLINICAL DATA:  Initial evaluation for  abnormal LFTs. EXAM: ULTRASOUND ABDOMEN LIMITED RIGHT UPPER QUADRANT COMPARISON:  Prior study from 09/20/2017. FINDINGS: Gallbladder: Echogenic material within the gallbladder lumen suggestive of possible sludge. No frank shadowing echogenic stones. Gallbladder wall mildly thickened to 3.7 mm, suspected be related to ascites. No sonographic Murphy sign elicited on exam. Common bile duct: Diameter: 3.7 mm Liver: Liver demonstrates a coarse heterogeneous and diffusely echogenic echotexture with nodular contour, consistent with cirrhosis. No focal lesions identified. No demonstrable flow is visualized within the main portal vein, similar to previous exam from 09/20/2017. At least moderate volume ascites noted within the visualized abdomen. IMPRESSION: 1. Hepatic cirrhosis with ascites. 2. No demonstrable flow within the main portal vein by sonography, similar to previous exam. Again, follow-up examination with dedicated cross-sectional imaging could be performed for further evaluation as warranted. 3. Gallbladder sludge. No other sonographic features to suggest acute cholecystitis. No biliary dilatation. Electronically Signed   By: Jeannine Boga M.D.   On: 09/27/2017 21:03     ASSESSMENT AND PLAN:   66 year old male with past medical history of alcohol abuse, alcoholic liver cirrhosis who presented to the hospital after a fall and also increasing abdominal girth and pain.  *Acute hepatic encephalopathy.  Continue lactulose.  * Gastritis and duodenal ulcer. Recent GI bleed.  Hemoglobin stable. Monitor. PPI as per  *Alcoholic cirrhosis with worsening Patient continues to drink alcohol.  Meld score 26.  Poor prognosis. GI consulted and appreciate input. Liver Doppler for portal vein thrombosis.  4. Alcohol abuse-continue CIWA  protocol.  Consulted palliative care.  CODE STATUS: Full code  DVT Prophylaxis: Teds and SCDs  TOTAL TIME TAKING CARE OF THIS PATIENT: 30 minutes.    Hillary Bow R M.D on 09/28/2017 at 1:03 PM  Between 7am to 6pm - Pager - [phone]  After 6pm go to [email or web address] - [employer] [facility]  Office  [phone]  CC: Primary care physician; Lucilla Lame, MD

## 2017-09-28 NOTE — Care Management (Signed)
Patient readmitted with hyperkalemia.  Patient open with [facility] home health.  Malachy Mood with [facility] notified of admission.  Malachy Mood states that patient was found at home sitting on the couch with feces, and urine.  And appeared to be intoxicated.  Blood alcohol level on admission 69.  RNCM met with patient to review Medicare obs letter.   Previous admission patient refused SNF, refused homeless shelter, was unable to reach family multiple times, sent police to the home and was unable to locate family, patient did not have a house key to go home via EMS.  Ultimately patient was delivered a [facility] 12, and left the following day by paying for his own taxi home.    Patient states again states "I won't be going any where but home at discharge".  Confirms that he does not have his house key, and has been unable to reach his family.  RNCM informed patient that he would need to again arrange a cab for discharge.

## 2017-09-29 DIAGNOSIS — R4020 Unspecified coma: Secondary | ICD-10-CM | POA: Diagnosis not present

## 2017-09-29 DIAGNOSIS — F1010 Alcohol abuse, uncomplicated: Secondary | ICD-10-CM | POA: Diagnosis present

## 2017-09-29 DIAGNOSIS — K766 Portal hypertension: Secondary | ICD-10-CM | POA: Diagnosis present

## 2017-09-29 DIAGNOSIS — R410 Disorientation, unspecified: Secondary | ICD-10-CM | POA: Diagnosis present

## 2017-09-29 DIAGNOSIS — K7290 Hepatic failure, unspecified without coma: Secondary | ICD-10-CM | POA: Diagnosis not present

## 2017-09-29 DIAGNOSIS — G9340 Encephalopathy, unspecified: Secondary | ICD-10-CM | POA: Diagnosis present

## 2017-09-29 DIAGNOSIS — K7031 Alcoholic cirrhosis of liver with ascites: Secondary | ICD-10-CM | POA: Diagnosis not present

## 2017-09-29 DIAGNOSIS — R945 Abnormal results of liver function studies: Secondary | ICD-10-CM | POA: Diagnosis not present

## 2017-09-29 DIAGNOSIS — Z7189 Other specified counseling: Secondary | ICD-10-CM | POA: Diagnosis not present

## 2017-09-29 DIAGNOSIS — I959 Hypotension, unspecified: Secondary | ICD-10-CM | POA: Diagnosis present

## 2017-09-29 DIAGNOSIS — K7011 Alcoholic hepatitis with ascites: Secondary | ICD-10-CM | POA: Diagnosis present

## 2017-09-29 DIAGNOSIS — K7040 Alcoholic hepatic failure without coma: Secondary | ICD-10-CM | POA: Diagnosis present

## 2017-09-29 DIAGNOSIS — E876 Hypokalemia: Secondary | ICD-10-CM | POA: Diagnosis present

## 2017-09-29 DIAGNOSIS — D696 Thrombocytopenia, unspecified: Secondary | ICD-10-CM | POA: Diagnosis present

## 2017-09-29 DIAGNOSIS — E875 Hyperkalemia: Secondary | ICD-10-CM | POA: Diagnosis present

## 2017-09-29 DIAGNOSIS — K7200 Acute and subacute hepatic failure without coma: Secondary | ICD-10-CM | POA: Diagnosis not present

## 2017-09-29 DIAGNOSIS — K2970 Gastritis, unspecified, without bleeding: Secondary | ICD-10-CM | POA: Diagnosis present

## 2017-09-29 DIAGNOSIS — K267 Chronic duodenal ulcer without hemorrhage or perforation: Secondary | ICD-10-CM | POA: Diagnosis present

## 2017-09-29 DIAGNOSIS — Z515 Encounter for palliative care: Secondary | ICD-10-CM | POA: Diagnosis not present

## 2017-09-29 DIAGNOSIS — K3189 Other diseases of stomach and duodenum: Secondary | ICD-10-CM | POA: Diagnosis present

## 2017-09-29 DIAGNOSIS — F17210 Nicotine dependence, cigarettes, uncomplicated: Secondary | ICD-10-CM | POA: Diagnosis present

## 2017-09-29 LAB — COMPREHENSIVE METABOLIC PANEL
ALT: 101 U/L — ABNORMAL HIGH (ref 17–63)
ANION GAP: 8 (ref 5–15)
AST: 284 U/L — ABNORMAL HIGH (ref 15–41)
Albumin: 2.8 g/dL — ABNORMAL LOW (ref 3.5–5.0)
Alkaline Phosphatase: 105 U/L (ref 38–126)
BUN: 16 mg/dL (ref 6–20)
CALCIUM: 8.8 mg/dL — AB (ref 8.9–10.3)
CHLORIDE: 106 mmol/L (ref 101–111)
CO2: 26 mmol/L (ref 22–32)
Creatinine, Ser: UNDETERMINED mg/dL (ref 0.61–1.24)
Glucose, Bld: 115 mg/dL — ABNORMAL HIGH (ref 65–99)
POTASSIUM: 3.2 mmol/L — AB (ref 3.5–5.1)
SODIUM: 140 mmol/L (ref 135–145)
Total Bilirubin: 28.3 mg/dL (ref 0.3–1.2)
Total Protein: 6.6 g/dL (ref 6.5–8.1)

## 2017-09-29 LAB — EBV AB TO VIRAL CAPSID AG PNL, IGG+IGM: EBV VCA IGG: 496 U/mL — AB (ref 0.0–17.9)

## 2017-09-29 LAB — CBC WITH DIFFERENTIAL/PLATELET
BASOS PCT: 1 %
Basophils Absolute: 0.1 10*3/uL (ref 0–0.1)
EOS ABS: 0 10*3/uL (ref 0–0.7)
EOS PCT: 0 %
HCT: 27.5 % — ABNORMAL LOW (ref 40.0–52.0)
Hemoglobin: 8.8 g/dL — ABNORMAL LOW (ref 13.0–18.0)
LYMPHS ABS: 0.4 10*3/uL — AB (ref 1.0–3.6)
Lymphocytes Relative: 4 %
MCH: 26.8 pg (ref 26.0–34.0)
MCHC: 32 g/dL (ref 32.0–36.0)
MCV: 83.7 fL (ref 80.0–100.0)
MONOS PCT: 8 %
Monocytes Absolute: 0.8 10*3/uL (ref 0.2–1.0)
Neutro Abs: 8.5 10*3/uL — ABNORMAL HIGH (ref 1.4–6.5)
Neutrophils Relative %: 87 %
PLATELETS: 101 10*3/uL — AB (ref 150–440)
RBC: 3.29 MIL/uL — ABNORMAL LOW (ref 4.40–5.90)
RDW: 30.1 % — AB (ref 11.5–14.5)
WBC: 9.9 10*3/uL (ref 3.8–10.6)

## 2017-09-29 LAB — HEPATITIS B E ANTIGEN: HEP B E AG: NEGATIVE

## 2017-09-29 LAB — HEPATITIS A ANTIBODY, IGM: Hep A IgM: NEGATIVE

## 2017-09-29 LAB — HEPATITIS C ANTIBODY: HCV Ab: <0.1 s/co ratio (ref 0.0–0.9)

## 2017-09-29 LAB — HIV ANTIBODY (ROUTINE TESTING W REFLEX): HIV SCREEN 4TH GENERATION: NONREACTIVE

## 2017-09-29 LAB — HEPATITIS B SURFACE ANTIGEN: Hepatitis B Surface Ag: NEGATIVE

## 2017-09-29 MED ORDER — NADOLOL 20 MG PO TABS
10.0000 mg | ORAL_TABLET | Freq: Every day | ORAL | Status: DC
  Administered 2017-09-29 – 2017-09-30 (×2): 10 mg via ORAL
  Filled 2017-09-29 (×3): qty 1

## 2017-09-29 MED ORDER — VITAMIN K1 10 MG/ML IJ SOLN
10.0000 mg | Freq: Every day | INTRAMUSCULAR | Status: AC
  Administered 2017-09-29 – 2017-10-01 (×3): 10 mg via SUBCUTANEOUS
  Filled 2017-09-29 (×3): qty 1

## 2017-09-29 MED ORDER — LACTULOSE 10 GM/15ML PO SOLN
30.0000 g | Freq: Every day | ORAL | Status: DC
  Administered 2017-09-29: 30 g via ORAL
  Filled 2017-09-29: qty 60

## 2017-09-29 NOTE — Progress Notes (Signed)
Craig Maldonado , MD [address], [place], [place], [place], [address] [id number] [place], [place], [place], [address] Phone: [phone]  Fax: [phone]   Craig Maldonado is being followed for acute liver failure day 1 of follow up   Subjective: Unable to answer any questions- appears confused   Objective: Vital signs in last 24 hours: Vitals:   09/28/17 1939 09/29/17 0527 09/29/17 0600 09/29/17 0800  BP: (!) 150/93 107/66  111/68  Pulse: 87 89  87  Resp: 20 20  (!) 30  Temp: 98.3 F (36.8 C) 98.6 F (37 C)  (!) 97.4 F (36.3 C)  TempSrc: Oral Oral  Oral  SpO2: 100% 93%  99%  Weight:   226 lb 9.6 oz (102.8 kg)   Height:       Weight change: 11 lb 9.6 oz (5.262 kg)  Intake/Output Summary (Last 24 hours) at 09/29/2017 1040 Last data filed at 09/29/2017 0854 Gross per 24 hour  Intake 240 ml  Output 550 ml  Net -310 ml     Exam: Heart:: Regular rate and rhythm or systolic murmur hear over the precordium  Lungs: normal, clear to auscultation and clear to auscultation and percussion Abdomen: soft, nontender, normal bowel sounds   Lab Results: @LABTEST2 @ Micro Results: Recent Results (from the past 240 hour(s))  Culture, blood (routine x 2)     Status: None (Preliminary result)   Collection Time: 09/27/17  9:15 PM  Result Value Ref Range Status   Specimen Description BLOOD RIGHT ANTECUBITAL  Final   Special Requests   Final    BOTTLES DRAWN AEROBIC AND ANAEROBIC Blood Culture adequate volume   Culture NO GROWTH 2 DAYS  Final   Report Status PENDING  Incomplete  Culture, blood (routine x 2)     Status: None (Preliminary result)   Collection Time: 09/27/17  9:20 PM  Result Value Ref Range Status   Specimen Description BLOOD BLOOD RIGHT HAND  Final   Special Requests   Final    BOTTLES DRAWN AEROBIC AND ANAEROBIC Blood Culture adequate volume   Culture NO GROWTH 2 DAYS  Final   Report Status PENDING  Incomplete   Studies/Results: US Abdomen  Limited  Result Date: 09/28/2017 CLINICAL DATA:  Evaluate for ascites EXAM: LIMITED ABDOMEN ULTRASOUND FOR ASCITES TECHNIQUE: Limited ultrasound survey for ascites was performed in all four abdominal quadrants. COMPARISON:  None. FINDINGS: There is no evidence of ascites in the abdomen. Paracentesis was not performed. IMPRESSION: There was no evidence of ascites. Electronically Signed   By: Marybelle Killings M.D.   On: 09/28/2017 13:51   Dg Chest Port 1 View  Result Date: 09/27/2017 CLINICAL DATA:  Initial evaluation for acute tachycardia. EXAM: PORTABLE CHEST 1 VIEW COMPARISON:  Prior radiograph from 09/23/2017. FINDINGS: Stable cardiomegaly. Allowing for technique, mediastinal silhouette within normal limits. Lungs hypoinflated. Diffuse vascular congestion with interstitial prominence, suggesting mild pulmonary interstitial edema. Small right pleural effusion. Associated streaky right basilar opacities slightly improved from prior, which may reflect improved atelectasis or infiltrate. No new focal airspace disease. No pneumothorax. No acute osseus abnormality. IMPRESSION: 1. Cardiomegaly with mild diffuse pulmonary interstitial congestion/edema. 2. Persistent small right pleural effusion. Slightly improved right basilar opacity as compared to previous, which may reflect improved atelectasis or infiltrate. Electronically Signed   By: Jeannine Boga M.D.   On: 09/27/2017 20:55   US Abdominal Pelvic Art/vent Flow Doppler  Result Date: 09/28/2017 EXAM: DUPLEX ULTRASOUND OF LIVER TECHNIQUE: Color and duplex Doppler ultrasound  was performed to evaluate the hepatic in-flow and out-flow vessels. Technologist describes technically difficult study secondary to the echodense liver parenchyma. COMPARISON:  CT 02/11/2016 FINDINGS: Portal Vein: No occlusion or thrombus. Velocities (all hepatopetal): Main:  41-49 cm/sec Right:  38 cm/sec Left:  12 cm/sec Hepatic Vein Velocities (all hepatofugal): Right:  25 cm/sec  Middle:  22 cm/sec Left:  17 cm/sec Hepatic Artery Velocity:  105 cm/sec Spleen 8.2 x 15 x 7.4 cm (volume = 480 cm^3). Splenic Vein: No occlusion or thrombus in visualized segments. Velocity: 19 cm/sec Varices: None identified Ascites: Minimal IMPRESSION: 1. Unremarkable liver vascular Doppler evaluation. 2. Minimal abdominal ascites. Electronically Signed   By: Lucrezia Europe M.D.   On: 09/28/2017 14:18   US Abdomen Limited Ruq  Result Date: 09/27/2017 CLINICAL DATA:  Initial evaluation for abnormal LFTs. EXAM: ULTRASOUND ABDOMEN LIMITED RIGHT UPPER QUADRANT COMPARISON:  Prior study from 09/20/2017. FINDINGS: Gallbladder: Echogenic material within the gallbladder lumen suggestive of possible sludge. No frank shadowing echogenic stones. Gallbladder wall mildly thickened to 3.7 mm, suspected be related to ascites. No sonographic Murphy sign elicited on exam. Common bile duct: Diameter: 3.7 mm Liver: Liver demonstrates a coarse heterogeneous and diffusely echogenic echotexture with nodular contour, consistent with cirrhosis. No focal lesions identified. No demonstrable flow is visualized within the main portal vein, similar to previous exam from 09/20/2017. At least moderate volume ascites noted within the visualized abdomen. IMPRESSION: 1. Hepatic cirrhosis with ascites. 2. No demonstrable flow within the main portal vein by sonography, similar to previous exam. Again, follow-up examination with dedicated cross-sectional imaging could be performed for further evaluation as warranted. 3. Gallbladder sludge. No other sonographic features to suggest acute cholecystitis. No biliary dilatation. Electronically Signed   By: Jeannine Boga M.D.   On: 09/27/2017 21:03   Medications: I have reviewed the patient's current medications. Scheduled Meds: . ferrous sulfate  325 mg Oral Q breakfast  . folic acid  1 mg Oral Daily  . furosemide  40 mg Intravenous Daily  . lactulose  30 g Oral Daily  . mouth rinse  15 mL  Mouth Rinse BID  . multivitamin with minerals  1 tablet Oral Daily  . nadolol  10 mg Oral QHS  . pantoprazole  40 mg Oral BID  . pentoxifylline  400 mg Oral TID WC  . sodium chloride flush  3 mL Intravenous Q12H  . thiamine  100 mg Oral Daily   Continuous Infusions: . sodium chloride    . albumin human     PRN Meds:.sodium chloride, acetaminophen **OR** acetaminophen, benzonatate, HYDROcodone-acetaminophen, LORazepam **OR** LORazepam, ondansetron **OR** ondansetron (ZOFRAN) IV, sodium chloride flush, traZODone   Assessment: Active Problems:   Hyperkalemia  Earon Rivest is a 66 y.o. y/o male with a history of decompensated alcoholic liver cirrhosis, ascites. Recently discharged with alcoholic hepatitis, bleeding from a duodenal ulcer, Ongoing alcohol use, very poor prognosis.  He is admitted with  liver failure and hepatic  Encephalopathy. USG doppler shows no thrombosis , minimal ascites, no hepatoma on USG. Tbilirubin and INR rising . No evidence of prior Hepatitis A/B/C. Urine drug screen positive for THC and opioids.    Plan  1.IV thiamine to prevent wernicke's encephalopathy  2. F/u palliative care consult to determine goals of care.   3. Lactulose titrate to 2-3 soft bowel movements a day , avoid diarrhea as dehydration can worsen hepatic encephalopathy. Guide therapy clinically and not based on ammonia levels.  4. Low salt diet .  5. Consider  CT head to subdural hematoma 6. Not a candidate for liver transplant due to active alcohol abuse .  7 . Continue pentoxyphylline when he is able to swallow tablets 8. Nutrition consult as good nutrition decreases mortality in alcoholic hepatitis 9. Vitamin K 10 mg S. C Q daily for 3 days to correct any nutritional deficiencies.  10. Overall poor prognosis.  11. Limit narcotics can worsen encephelopathy   LOS: 0 days   Craig Bellows, MD 09/29/2017, 10:40 AM

## 2017-09-29 NOTE — Progress Notes (Signed)
Patient ID: Craig Maldonado, male   DOB: 1950/12/21, 66 y.o.   MRN: [id number]  [facility] PROGRESS NOTE  Craig Maldonado [id number] DOB: 1951-07-07 DOA: 09/27/2017 PCP: Craig Lame, MD  HPI/Subjective: Patient awakened from sleep.  There is states that he had numerous bowel movements overnight.  Patient feels weak.  Patient does not complain of itching despite being jaundiced.  Objective: Vitals:   09/28/17 1939 09/29/17 0527  BP: (!) 150/93 107/66  Pulse: 87 89  Resp: 20 20  Temp: 98.3 F (36.8 C) 98.6 F (37 C)  SpO2: 100% 93%    Intake/Output Summary (Last 24 hours) at 09/29/2017 0635 Last data filed at 09/28/2017 1900 Gross per 24 hour  Intake 840 ml  Output 801 ml  Net 39 ml   Filed Weights   09/27/17 1510 09/28/17 0500 09/29/17 0600  Weight: 97.5 kg (215 lb) 104.1 kg (229 lb 6.4 oz) 102.8 kg (226 lb 9.6 oz)    ROS: Review of Systems  Constitutional: Negative for chills and fever.  Eyes: Negative for blurred vision.  Respiratory: Negative for cough and shortness of breath.   Cardiovascular: Negative for chest pain.  Gastrointestinal: Positive for diarrhea. Negative for abdominal pain, constipation, nausea and vomiting.  Genitourinary: Negative for dysuria.  Musculoskeletal: Negative for joint pain.  Skin: Negative for itching.  Neurological: Positive for weakness. Negative for dizziness and headaches.   Exam: Physical Exam  Constitutional: He is oriented to person, place, and time.  HENT:  Right Ear: Tympanic membrane normal.  Left Ear: Tympanic membrane normal.  Nose: No mucosal edema.  Mouth/Throat: No oropharyngeal exudate or posterior oropharyngeal edema.  Eyes: EOM and lids are normal. Pupils are equal, round, and reactive to light.  Conjunctiva jaundiced  Neck: Neck supple. No JVD present. Carotid bruit is not present. No tracheal deviation and no edema present. No thyroid mass and no thyromegaly present.  Cardiovascular: Regular rhythm, S1 normal  and S2 normal. Exam reveals no gallop.  Murmur heard.  Systolic murmur is present with a grade of 4/6. Pulses:      Dorsalis pedis pulses are 2+ on the right side, and 2+ on the left side.  Respiratory: No accessory muscle usage. No respiratory distress. He has no wheezes. He has no rhonchi. He has no rales.  GI: Soft. Bowel sounds are normal. He exhibits no distension. There is no hepatosplenomegaly. There is no tenderness. There is no CVA tenderness.  Musculoskeletal:       Right ankle: He exhibits no swelling.       Left ankle: He exhibits no swelling.  Lymphadenopathy:    He has no cervical adenopathy.    He has no axillary adenopathy.  Neurological: He is alert and oriented to person, place, and time. No cranial nerve deficit.  Skin: Skin is warm. No rash noted. Nails show no clubbing.  Jaundiced. Large callus right first toe  Psychiatric: He has a normal mood and affect.      Data Reviewed: Basic Metabolic Panel: Recent Labs  Lab 09/23/17 0527 09/24/17 0509 09/27/17 1513 09/28/17 0501 09/29/17 0451  NA 131* 135 136 138 140  K 3.9 3.7 5.7* 3.7 3.2*  CL 98* 101 103 105 106  CO2 26 26 22 24 26   GLUCOSE 126* 113* 97 118* 115*  BUN 28* 20 15 18 16   CREATININE 1.43* 1.02 <0.30* UNABLE TO REPORT DUE TO ICTERUS INTERFERENCE UNABLE TO REPORT DUE TO ICTERUS  CALCIUM 8.3* 8.3* 8.6* 8.5* 8.8*   Liver Function  Tests: Recent Labs  Lab 09/23/17 0527 09/24/17 0509 09/27/17 1513 09/28/17 0501 09/29/17 0451  AST 122* 143* 414* 330* 284*  ALT 54 57 113* 108* 101*  ALKPHOS 126 108 109 109 105  BILITOT 15.8* 19.6* 26.2* 26.3* 28.3*  PROT 6.2* 6.6 6.4* 6.3* 6.6  ALBUMIN 3.2* 3.4* 2.9* 2.9* 2.8*   Recent Labs  Lab 09/27/17 1513  LIPASE 128*   Recent Labs  Lab 09/27/17 1513 09/28/17 0501  AMMONIA 73* 53*   CBC: Recent Labs  Lab 09/23/17 0527 09/24/17 0509 09/25/17 0346 09/27/17 1513 09/29/17 0451  WBC 6.6 8.5  --  10.8* 9.9  NEUTROABS  --   --   --   --  8.5*   HGB 7.5* 8.1* 7.5* 9.0* 8.8*  HCT 23.7* 25.7*  --  28.3* 27.5*  MCV 81.1 82.5  --  85.8 83.7  PLT 116* 122*  --  179 101*   BNP (last 3 results) Recent Labs    09/28/17 0501  BNP 458.0*     CBG: Recent Labs  Lab 09/28/17 0757  GLUCAP 118*    Recent Results (from the past 240 hour(s))  Culture, blood (routine x 2)     Status: None (Preliminary result)   Collection Time: 09/27/17  9:15 PM  Result Value Ref Range Status   Specimen Description BLOOD RIGHT ANTECUBITAL  Final   Special Requests   Final    BOTTLES DRAWN AEROBIC AND ANAEROBIC Blood Culture adequate volume   Culture NO GROWTH < 12 HOURS  Final   Report Status PENDING  Incomplete  Culture, blood (routine x 2)     Status: None (Preliminary result)   Collection Time: 09/27/17  9:20 PM  Result Value Ref Range Status   Specimen Description BLOOD BLOOD RIGHT HAND  Final   Special Requests   Final    BOTTLES DRAWN AEROBIC AND ANAEROBIC Blood Culture adequate volume   Culture NO GROWTH < 12 HOURS  Final   Report Status PENDING  Incomplete     Studies: US Abdomen Limited  Result Date: 09/28/2017 CLINICAL DATA:  Evaluate for ascites EXAM: LIMITED ABDOMEN ULTRASOUND FOR ASCITES TECHNIQUE: Limited ultrasound survey for ascites was performed in all four abdominal quadrants. COMPARISON:  None. FINDINGS: There is no evidence of ascites in the abdomen. Paracentesis was not performed. IMPRESSION: There was no evidence of ascites. Electronically Signed   By: Marybelle Killings M.D.   On: 09/28/2017 13:51   Dg Chest Port 1 View  Result Date: 09/27/2017 CLINICAL DATA:  Initial evaluation for acute tachycardia. EXAM: PORTABLE CHEST 1 VIEW COMPARISON:  Prior radiograph from 09/23/2017. FINDINGS: Stable cardiomegaly. Allowing for technique, mediastinal silhouette within normal limits. Lungs hypoinflated. Diffuse vascular congestion with interstitial prominence, suggesting mild pulmonary interstitial edema. Small right pleural effusion.  Associated streaky right basilar opacities slightly improved from prior, which may reflect improved atelectasis or infiltrate. No new focal airspace disease. No pneumothorax. No acute osseus abnormality. IMPRESSION: 1. Cardiomegaly with mild diffuse pulmonary interstitial congestion/edema. 2. Persistent small right pleural effusion. Slightly improved right basilar opacity as compared to previous, which may reflect improved atelectasis or infiltrate. Electronically Signed   By: Jeannine Boga M.D.   On: 09/27/2017 20:55   US Abdominal Pelvic Art/vent Flow Doppler  Result Date: 09/28/2017 EXAM: DUPLEX ULTRASOUND OF LIVER TECHNIQUE: Color and duplex Doppler ultrasound was performed to evaluate the hepatic in-flow and out-flow vessels. Technologist describes technically difficult study secondary to the echodense liver parenchyma. COMPARISON:  CT 02/11/2016 FINDINGS:  Portal Vein: No occlusion or thrombus. Velocities (all hepatopetal): Main:  41-49 cm/sec Right:  38 cm/sec Left:  12 cm/sec Hepatic Vein Velocities (all hepatofugal): Right:  25 cm/sec Middle:  22 cm/sec Left:  17 cm/sec Hepatic Artery Velocity:  105 cm/sec Spleen 8.2 x 15 x 7.4 cm (volume = 480 cm^3). Splenic Vein: No occlusion or thrombus in visualized segments. Velocity: 19 cm/sec Varices: None identified Ascites: Minimal IMPRESSION: 1. Unremarkable liver vascular Doppler evaluation. 2. Minimal abdominal ascites. Electronically Signed   By: Lucrezia Europe M.D.   On: 09/28/2017 14:18   US Abdomen Limited Ruq  Result Date: 09/27/2017 CLINICAL DATA:  Initial evaluation for abnormal LFTs. EXAM: ULTRASOUND ABDOMEN LIMITED RIGHT UPPER QUADRANT COMPARISON:  Prior study from 09/20/2017. FINDINGS: Gallbladder: Echogenic material within the gallbladder lumen suggestive of possible sludge. No frank shadowing echogenic stones. Gallbladder wall mildly thickened to 3.7 mm, suspected be related to ascites. No sonographic Murphy sign elicited on exam. Common  bile duct: Diameter: 3.7 mm Liver: Liver demonstrates a coarse heterogeneous and diffusely echogenic echotexture with nodular contour, consistent with cirrhosis. No focal lesions identified. No demonstrable flow is visualized within the main portal vein, similar to previous exam from 09/20/2017. At least moderate volume ascites noted within the visualized abdomen. IMPRESSION: 1. Hepatic cirrhosis with ascites. 2. No demonstrable flow within the main portal vein by sonography, similar to previous exam. Again, follow-up examination with dedicated cross-sectional imaging could be performed for further evaluation as warranted. 3. Gallbladder sludge. No other sonographic features to suggest acute cholecystitis. No biliary dilatation. Electronically Signed   By: Jeannine Boga M.D.   On: 09/27/2017 21:03    Scheduled Meds: . ferrous sulfate  325 mg Oral Q breakfast  . folic acid  1 mg Oral Daily  . furosemide  40 mg Intravenous Daily  . lactulose  30 g Oral TID  . mouth rinse  15 mL Mouth Rinse BID  . metoprolol succinate  25 mg Oral Daily  . multivitamin with minerals  1 tablet Oral Daily  . pantoprazole  40 mg Oral BID  . pentoxifylline  400 mg Oral TID WC  . sodium chloride flush  3 mL Intravenous Q12H  . thiamine  100 mg Oral Daily   Continuous Infusions: . sodium chloride    . albumin human      Assessment/Plan:  1. Acute hepatic encephalopathy with elevated ammonia level.  Since the patient had numerous bowel movements overnight, and will decrease lactulose down to daily dosing. 2. Recent duodenal ulcer and gastritis on endoscopy.  Continue iron and Protonix 3. Alcoholic cirrhosis with jaundice, thrombocytopenia, auto anticoagulation.  Change Toprol over to nadolol.  No stay away from drinking.  Jaundice will take months to improve. 4. Alcohol abuse on Ciwa protocol.  Thiamine daily. 5. Weakness.  Physical therapy evaluation  Code Status:     Code Status Orders  (From  admission, onward)        Start     Ordered   09/27/17 2326  Full code  Continuous     09/27/17 2325    Code Status History    Date Active Date Inactive Code Status Order ID Comments User Context   09/21/2017 00:50 09/26/2017 19:27 Full Code [id number]  Lance Coon, MD ED   02/11/2016 07:36 02/22/2016 20:28 Full Code [id number]  Loletha Grayer, MD ED      Disposition Plan: Unclear at this point since the patient is high risk for readmission.  Would like to get the  patient to rehab  Consultants:  Gastroenterology  Palliative care  Time spent: 25 minutes  Craig Maldonado, Craig Maldonado

## 2017-09-30 ENCOUNTER — Inpatient Hospital Stay: Payer: Medicare Other

## 2017-09-30 LAB — COMPREHENSIVE METABOLIC PANEL
ALK PHOS: 109 U/L (ref 38–126)
ALT: 99 U/L — AB (ref 17–63)
AST: 237 U/L — AB (ref 15–41)
Albumin: 2.5 g/dL — ABNORMAL LOW (ref 3.5–5.0)
Anion gap: 9 (ref 5–15)
BUN: 15 mg/dL (ref 6–20)
CALCIUM: 8.6 mg/dL — AB (ref 8.9–10.3)
CHLORIDE: 104 mmol/L (ref 101–111)
CO2: 27 mmol/L (ref 22–32)
CREATININE: UNDETERMINED mg/dL (ref 0.61–1.24)
Glucose, Bld: 101 mg/dL — ABNORMAL HIGH (ref 65–99)
Potassium: 3.2 mmol/L — ABNORMAL LOW (ref 3.5–5.1)
Sodium: 140 mmol/L (ref 135–145)
Total Bilirubin: 27.6 mg/dL (ref 0.3–1.2)
Total Protein: 6.2 g/dL — ABNORMAL LOW (ref 6.5–8.1)

## 2017-09-30 LAB — HIV-1 RNA ULTRAQUANT REFLEX TO GENTYP+: HIV-1 RNA QUANT, LOG: UNDETERMINED {Log_copies}/mL

## 2017-09-30 LAB — HEPATITIS C VRS RNA DETECT BY PCR-QUAL: Hepatitis C Vrs RNA by PCR-Qual: NEGATIVE

## 2017-09-30 LAB — HEPATITIS B DNA, ULTRAQUANTITATIVE, PCR
HBV DNA SERPL PCR-ACNC: NOT DETECTED [IU]/mL
HBV DNA SERPL PCR-LOG IU: UNDETERMINED log10 IU/mL

## 2017-09-30 MED ORDER — IPRATROPIUM-ALBUTEROL 0.5-2.5 (3) MG/3ML IN SOLN
3.0000 mL | Freq: Four times a day (QID) | RESPIRATORY_TRACT | Status: DC
  Administered 2017-09-30: 3 mL via RESPIRATORY_TRACT
  Filled 2017-09-30 (×2): qty 3

## 2017-09-30 MED ORDER — POTASSIUM CHLORIDE CRYS ER 20 MEQ PO TBCR
20.0000 meq | EXTENDED_RELEASE_TABLET | Freq: Two times a day (BID) | ORAL | Status: DC
  Administered 2017-09-30 – 2017-10-03 (×6): 20 meq via ORAL
  Filled 2017-09-30 (×7): qty 1

## 2017-09-30 MED ORDER — IPRATROPIUM-ALBUTEROL 0.5-2.5 (3) MG/3ML IN SOLN: 3.0000 mL | Freq: Four times a day (QID) | RESPIRATORY_TRACT | Status: DC | PRN

## 2017-09-30 MED ORDER — BUDESONIDE 0.5 MG/2ML IN SUSP
0.5000 mg | Freq: Two times a day (BID) | RESPIRATORY_TRACT | Status: DC
  Administered 2017-09-30: 0.5 mg via RESPIRATORY_TRACT
  Filled 2017-09-30 (×2): qty 2

## 2017-09-30 MED ORDER — LACTULOSE 10 GM/15ML PO SOLN
30.0000 g | Freq: Three times a day (TID) | ORAL | Status: DC
  Administered 2017-09-30 (×3): 30 g via ORAL
  Filled 2017-09-30 (×2): qty 60

## 2017-09-30 NOTE — Progress Notes (Signed)
PT Cancellation Note  Patient Details Name: Craig Maldonado MRN: [id number] DOB: 05-04-1951   Cancelled Treatment:    Reason Eval/Treat Not Completed: Patient declined, no reason specified.  PT consult received.  Chart reviewed.  Pt initially agreeable to PT evaluation but then pt refused to participate.  Increased work of breathing noted with pt laying in bed (O2 92% on room air; HR 76 bpm); nursing notified and aware.  Will re-attempt PT evaluation at a later date/time.  Leitha Bleak, PT 09/30/17, 4:17 PM [phone]

## 2017-09-30 NOTE — Plan of Care (Signed)
Patient will have a decrease in ammonia levels.  Craig Maldonado

## 2017-09-30 NOTE — Progress Notes (Signed)
Patient is refusing nebulizer txs

## 2017-09-30 NOTE — Progress Notes (Signed)
Patient ID: Craig Maldonado, male   DOB: 10/06/1951, 65 y.o.   MRN: [id number]   [facility] PROGRESS NOTE  Craig Maldonado [id number] DOB: 11-Aug-1951 DOA: 09/27/2017 PCP: Lucilla Lame, MD  HPI/Subjective: As per nursing staff, the patient only had one bowel movement this morning and did not have much yesterday after having a lot the day before.  Patient having some cough and some wheeze.  Objective: Vitals:   09/29/17 2012 09/30/17 0705  BP: 127/65 119/71  Pulse: 84 70  Resp: 20 20  Temp: 98 F (36.7 C) 98.2 F (36.8 C)  SpO2: 100% 94%    Intake/Output Summary (Last 24 hours) at 09/30/2017 0819 Last data filed at 09/30/2017 0022 Gross per 24 hour  Intake 240 ml  Output 3 ml  Net 237 ml   Filed Weights   09/28/17 0500 09/29/17 0600 09/30/17 0213  Weight: 104.1 kg (229 lb 6.4 oz) 102.8 kg (226 lb 9.6 oz) 100.2 kg (220 lb 14.4 oz)    ROS: Review of Systems  Constitutional: Negative for chills and fever.  Eyes: Negative for blurred vision.  Respiratory: Positive for cough and wheezing. Negative for shortness of breath.   Cardiovascular: Negative for chest pain.  Gastrointestinal: Negative for abdominal pain, constipation, diarrhea, nausea and vomiting.  Genitourinary: Negative for dysuria.  Musculoskeletal: Negative for joint pain.  Skin: Negative for itching.  Neurological: Positive for weakness. Negative for dizziness and headaches.   Exam: Physical Exam  Constitutional: He is oriented to person, place, and time.  HENT:  Right Ear: Tympanic membrane normal.  Left Ear: Tympanic membrane normal.  Nose: No mucosal edema.  Mouth/Throat: No oropharyngeal exudate or posterior oropharyngeal edema.  Eyes: EOM and lids are normal. Pupils are equal, round, and reactive to light.  Conjunctiva jaundiced  Neck: Neck supple. No JVD present. Carotid bruit is not present. No tracheal deviation and no edema present. No thyroid mass and no thyromegaly present.  Cardiovascular:  Regular rhythm, S1 normal and S2 normal. Exam reveals no gallop.  Murmur heard.  Systolic murmur is present with a grade of 4/6. Pulses:      Dorsalis pedis pulses are 2+ on the right side, and 2+ on the left side.  Respiratory: No accessory muscle usage. No respiratory distress. He has no wheezes. He has no rhonchi. He has no rales.  GI: Soft. Bowel sounds are normal. He exhibits no distension. There is no hepatosplenomegaly. There is no tenderness. There is no CVA tenderness.  Musculoskeletal:       Right ankle: He exhibits no swelling.       Left ankle: He exhibits no swelling.  Lymphadenopathy:    He has no cervical adenopathy.    He has no axillary adenopathy.  Neurological: He is alert and oriented to person, place, and time. No cranial nerve deficit.  Skin: Skin is warm. No rash noted. Nails show no clubbing.  Jaundiced. Large callus right first toe  Psychiatric: He has a normal mood and affect.      Data Reviewed: Basic Metabolic Panel: Recent Labs  Lab 09/24/17 0509 09/27/17 1513 09/28/17 0501 09/29/17 0451 09/30/17 0311  NA 135 136 138 140 140  K 3.7 5.7* 3.7 3.2* 3.2*  CL 101 103 105 106 104  CO2 26 22 24 26 27   GLUCOSE 113* 97 118* 115* 101*  BUN 20 15 18 16 15   CREATININE 1.02 <0.30* UNABLE TO REPORT DUE TO ICTERUS INTERFERENCE UNABLE TO REPORT DUE TO ICTERUS UNABLE TO REPORT DUE  TO ICTERUS  CALCIUM 8.3* 8.6* 8.5* 8.8* 8.6*   Liver Function Tests: Recent Labs  Lab 09/24/17 0509 09/27/17 1513 09/28/17 0501 09/29/17 0451 09/30/17 0311  AST 143* 414* 330* 284* 237*  ALT 57 113* 108* 101* 99*  ALKPHOS 108 109 109 105 109  BILITOT 19.6* 26.2* 26.3* 28.3* 27.6*  PROT 6.6 6.4* 6.3* 6.6 6.2*  ALBUMIN 3.4* 2.9* 2.9* 2.8* 2.5*   Recent Labs  Lab 09/27/17 1513  LIPASE 128*   Recent Labs  Lab 09/27/17 1513 09/28/17 0501  AMMONIA 73* 53*   CBC: Recent Labs  Lab 09/24/17 0509 09/25/17 0346 09/27/17 1513 09/29/17 0451  WBC 8.5  --  10.8* 9.9   NEUTROABS  --   --   --  8.5*  HGB 8.1* 7.5* 9.0* 8.8*  HCT 25.7*  --  28.3* 27.5*  MCV 82.5  --  85.8 83.7  PLT 122*  --  179 101*   BNP (last 3 results) Recent Labs    09/28/17 0501  BNP 458.0*     CBG: Recent Labs  Lab 09/28/17 0757  GLUCAP 118*    Recent Results (from the past 240 hour(s))  Culture, blood (routine x 2)     Status: None (Preliminary result)   Collection Time: 09/27/17  9:15 PM  Result Value Ref Range Status   Specimen Description BLOOD RIGHT ANTECUBITAL  Final   Special Requests   Final    BOTTLES DRAWN AEROBIC AND ANAEROBIC Blood Culture adequate volume   Culture NO GROWTH 3 DAYS  Final   Report Status PENDING  Incomplete  Culture, blood (routine x 2)     Status: None (Preliminary result)   Collection Time: 09/27/17  9:20 PM  Result Value Ref Range Status   Specimen Description BLOOD BLOOD RIGHT HAND  Final   Special Requests   Final    BOTTLES DRAWN AEROBIC AND ANAEROBIC Blood Culture adequate volume   Culture NO GROWTH 3 DAYS  Final   Report Status PENDING  Incomplete     Studies: US Abdomen Limited  Result Date: 09/28/2017 CLINICAL DATA:  Evaluate for ascites EXAM: LIMITED ABDOMEN ULTRASOUND FOR ASCITES TECHNIQUE: Limited ultrasound survey for ascites was performed in all four abdominal quadrants. COMPARISON:  None. FINDINGS: There is no evidence of ascites in the abdomen. Paracentesis was not performed. IMPRESSION: There was no evidence of ascites. Electronically Signed   By: Marybelle Killings M.D.   On: 09/28/2017 13:51   US Abdominal Pelvic Art/vent Flow Doppler  Result Date: 09/28/2017 EXAM: DUPLEX ULTRASOUND OF LIVER TECHNIQUE: Color and duplex Doppler ultrasound was performed to evaluate the hepatic in-flow and out-flow vessels. Technologist describes technically difficult study secondary to the echodense liver parenchyma. COMPARISON:  CT 02/11/2016 FINDINGS: Portal Vein: No occlusion or thrombus. Velocities (all hepatopetal): Main:  41-49  cm/sec Right:  38 cm/sec Left:  12 cm/sec Hepatic Vein Velocities (all hepatofugal): Right:  25 cm/sec Middle:  22 cm/sec Left:  17 cm/sec Hepatic Artery Velocity:  105 cm/sec Spleen 8.2 x 15 x 7.4 cm (volume = 480 cm^3). Splenic Vein: No occlusion or thrombus in visualized segments. Velocity: 19 cm/sec Varices: None identified Ascites: Minimal IMPRESSION: 1. Unremarkable liver vascular Doppler evaluation. 2. Minimal abdominal ascites. Electronically Signed   By: Lucrezia Europe M.D.   On: 09/28/2017 14:18    Scheduled Meds: . budesonide (PULMICORT) nebulizer solution  0.5 mg Nebulization BID  . ferrous sulfate  325 mg Oral Q breakfast  . folic acid  1 mg  Oral Daily  . furosemide  40 mg Intravenous Daily  . ipratropium-albuterol  3 mL Nebulization Q6H  . lactulose  30 g Oral TID  . mouth rinse  15 mL Mouth Rinse BID  . multivitamin with minerals  1 tablet Oral Daily  . nadolol  10 mg Oral QHS  . pantoprazole  40 mg Oral BID  . pentoxifylline  400 mg Oral TID WC  . phytonadione  10 mg Subcutaneous Daily  . potassium chloride  20 mEq Oral BID  . sodium chloride flush  3 mL Intravenous Q12H  . thiamine  100 mg Oral Daily   Continuous Infusions: . sodium chloride    . albumin human      Assessment/Plan:  1. Acute hepatic encephalopathy with elevated ammonia level.  Since the patient only had one bowel movement today and not much since I decreased the dose of lactulose I will increase the lactulose back up to 3 times daily and monitor.  Likely will end up needing twice daily dosing.  No need to keep on checking ammonia levels.  CT scan of the head ordered to rule out subdural hematoma. 2. Recent duodenal ulcer and gastritis on endoscopy.  Continue iron and Protonix 3. Alcoholic cirrhosis with jaundice, thrombocytopenia, auto anticoagulation, ascites.  Continue nadolol.  No stay away from drinking.  Jaundice will take months to improve.  Subcutaneous vitamin K ordered. 4. Alcohol abuse on Ciwa  protocol.  Thiamine daily. 5. Hypokalemia replace orally 6. Weakness.  Physical therapy evaluation.  Patient stated he is amenable to rehab at this point  Code Status:     Code Status Orders  (From admission, onward)        Start     Ordered   09/27/17 2326  Full code  Continuous     09/27/17 2325    Code Status History    Date Active Date Inactive Code Status Order ID Comments User Context   09/21/2017 00:50 09/26/2017 19:27 Full Code [id number]  Lance Coon, MD ED   02/11/2016 07:36 02/22/2016 20:28 Full Code [id number]  Loletha Grayer, MD ED      Disposition Plan: Unclear at this point since the patient is high risk for readmission.  Would like to get the patient to rehab.  Consultants:  Gastroenterology  Palliative care  Time spent: 24 minutes  [place], [place]

## 2017-10-01 DIAGNOSIS — K7031 Alcoholic cirrhosis of liver with ascites: Secondary | ICD-10-CM

## 2017-10-01 DIAGNOSIS — R7989 Other specified abnormal findings of blood chemistry: Secondary | ICD-10-CM

## 2017-10-01 DIAGNOSIS — R945 Abnormal results of liver function studies: Secondary | ICD-10-CM

## 2017-10-01 DIAGNOSIS — G9340 Encephalopathy, unspecified: Secondary | ICD-10-CM

## 2017-10-01 DIAGNOSIS — Z515 Encounter for palliative care: Secondary | ICD-10-CM

## 2017-10-01 DIAGNOSIS — Z7189 Other specified counseling: Secondary | ICD-10-CM

## 2017-10-01 LAB — COMPREHENSIVE METABOLIC PANEL
ALT: 100 U/L — ABNORMAL HIGH (ref 17–63)
ANION GAP: 9 (ref 5–15)
AST: 225 U/L — ABNORMAL HIGH (ref 15–41)
Albumin: 2.4 g/dL — ABNORMAL LOW (ref 3.5–5.0)
Alkaline Phosphatase: 110 U/L (ref 38–126)
BILIRUBIN TOTAL: 29.2 mg/dL — AB (ref 0.3–1.2)
BUN: 20 mg/dL (ref 6–20)
CO2: 29 mmol/L (ref 22–32)
Calcium: 8.7 mg/dL — ABNORMAL LOW (ref 8.9–10.3)
Chloride: 103 mmol/L (ref 101–111)
Creatinine, Ser: UNDETERMINED mg/dL (ref 0.61–1.24)
GLUCOSE: 124 mg/dL — AB (ref 65–99)
Potassium: 3.2 mmol/L — ABNORMAL LOW (ref 3.5–5.1)
Sodium: 141 mmol/L (ref 135–145)
Total Protein: 6.3 g/dL — ABNORMAL LOW (ref 6.5–8.1)

## 2017-10-01 LAB — CMV DNA, QUANTITATIVE, PCR
CMV DNA QUANT: NEGATIVE [IU]/mL
LOG10 CMV QN DNA PL: UNDETERMINED {Log_IU}/mL

## 2017-10-01 LAB — HERPES SIMPLEX VIRUS(HSV) DNA BY PCR
HSV 1 DNA: NEGATIVE
HSV 2 DNA: NEGATIVE

## 2017-10-01 LAB — PROTIME-INR
INR: 1.62
Prothrombin Time: 19.1 seconds — ABNORMAL HIGH (ref 11.4–15.2)

## 2017-10-01 MED ORDER — LACTULOSE 10 GM/15ML PO SOLN
30.0000 g | ORAL | Status: DC
  Administered 2017-10-01 – 2017-10-02 (×4): 30 g via ORAL
  Filled 2017-10-01 (×4): qty 60

## 2017-10-01 MED ORDER — RIFAXIMIN 550 MG PO TABS
550.0000 mg | ORAL_TABLET | Freq: Two times a day (BID) | ORAL | Status: DC
  Administered 2017-10-01 – 2017-10-03 (×5): 550 mg via ORAL
  Filled 2017-10-01 (×6): qty 1

## 2017-10-01 NOTE — Consult Note (Signed)
Consultation Note Date: 10/01/2017   Patient Name: Craig Maldonado  DOB: 1951/03/08  MRN: [id number]  Age / Sex: 66 y.o., male  PCP: Lucilla Lame, MD Referring Physician: Loletha Grayer, MD  Reason for Consultation: Establishing goals of care  HPI/Patient Profile: 66 y.o. male  with past medical history of liver cirrhosis, alcohol abuse, and gastric varices admitted on 09/27/2017 with abdominal distention and confusion. Recent hospitalization for acute symptomatic anemia, acute GI bleed s/p EGD showing gastritis/duodenal ulceration, and acute kidney injury. Per chart review, patient with poor home situation with enabling family members. GI following. Patient with decompensated alcoholic liver cirrhosis with ascites and ongoing alcohol use. He is not a candidate for liver transplant and with overall poor prognosis. Receiving lactulose, pentoxifylline, and rifaximin. Palliative medicine consultation for goals of care.    Clinical Assessment and Goals of Care: I have reviewed medical records, discussed with care team, and met with patient at bedside to discuss diagnosis, prognosis, GOC, EOL wishes, disposition and options. Patient wakes to voice and is alert and oriented this afternoon. Declines eating his lunch but will accept water.   Introduced Palliative Medicine as specialized medical care for people living with serious illness. It focuses on providing relief from the symptoms and stress of a serious illness. The goal is to improve quality of life for both the patient and the family.  We discussed a brief life review of the patient. Craig Maldonado tells me he lives at home with son and able to care for himself. He does tell me his son "enables my poor habits." He tells me he drinks "on and off."   I evaluated the patient's understanding of hospital diagnoses, interventions, and underlying liver cirrhosis secondary to alcohol  abuse. Patient is under the impression that he is "getting better." I explained that he is NOT a candidate for liver transplant and we are treating medically. I explained high risk for re-admission secondary to his liver failure. Also, poor long term prognosis with liver cirrhosis and failure and also his desire to continue to drink alcohol.   Advanced directives, concepts specific to code status, artifical feeding and hydration, and rehospitalization were considered and discussed. Patient does not have a documented living will or HCPOA. He tells me he only has one child, Billee Cashing, who automatically becomes his HCPOA if he was unable to make decision for himself. I introduced MOST form and asked Craig Maldonado his thoughts on aggressive medical interventions, such as if his heart were to stop and he died. He nods his head "no" to resuscitation. Craig Maldonado tells me he has spoken of his wishes against resuscitation to his son. Educated on importance of documenting EOL wishes with AD packet or MOST form.   I attempted to elicit values and goals of care important to the patient. Craig Maldonado tells me he wants to "go home and stay home." I explained that this could be possible with hospice services.    Craig Maldonado gives me permission to contact his son and attempt [place] meeting in person. VM left on  home phone.    SUMMARY OF RECOMMENDATIONS    Continue FULL scope treatment.   Introduced MOST form and AD packet.   Patient tells me he wants to "go home and stay home." Introduced hospice concept.   Voicemail left for son for further [place] conversations.    Code Status/Advance Care Planning:  Full code: patient tells me he would NOT want resuscitation. Will not change code status until discussed with son if he is available.   Symptom Management:   Per attending  Palliative Prophylaxis:   Aspiration, Delirium Protocol, Oral Care and Turn Reposition  Additional Recommendations (Limitations, Scope, Preferences):  Full Scope  Treatment  Psycho-social/Spiritual:   Desire for further Chaplaincy support:yes  Additional Recommendations: Caregiving  Support/Resources and Education on Hospice  Prognosis:   Guarded with acute on chronic liver failure secondary to ETOH liver cirrhosis and continued ETOH abuse. High risk for recurrent hospitalizations.  Discharge Planning: To Be Determined refusing rehab     Primary Diagnoses: Present on Admission: . Hyperkalemia . Encephalopathy acute   I have reviewed the medical record, interviewed the patient and family, and examined the patient. The following aspects are pertinent.  Past Medical History:  Diagnosis Date  . Alcohol abuse   . Alcoholic cirrhosis ([place])   . Gastric varices    Social History   Socioeconomic History  . Marital status: Married    Spouse name: None  . Number of children: None  . Years of education: None  . Highest education level: None  Social Needs  . Financial resource strain: None  . Food insecurity - worry: None  . Food insecurity - inability: None  . Transportation needs - medical: None  . Transportation needs - non-medical: None  Occupational History  . None  Tobacco Use  . Smoking status: Current Every Day Smoker    Packs/day: 0.50  . Smokeless tobacco: Never Used  Substance and Sexual Activity  . Alcohol use: Yes    Alcohol/week: 3.6 oz    Types: 6 Cans of beer per week  . Drug use: No  . Sexual activity: None  Other Topics Concern  . None  Social History Narrative  . None   Family History  Problem Relation Age of Onset  . Healthy Mother   . Tuberculosis Father    Scheduled Meds: . ferrous sulfate  325 mg Oral Q breakfast  . folic acid  1 mg Oral Daily  . lactulose  30 g Oral Q4H  . mouth rinse  15 mL Mouth Rinse BID  . multivitamin with minerals  1 tablet Oral Daily  . pantoprazole  40 mg Oral BID  . pentoxifylline  400 mg Oral TID WC  . potassium chloride  20 mEq Oral BID  . rifaximin  550 mg Oral  BID  . sodium chloride flush  3 mL Intravenous Q12H  . thiamine  100 mg Oral Daily   Continuous Infusions: . sodium chloride    . albumin human     PRN Meds:.sodium chloride, acetaminophen **OR** acetaminophen, benzonatate, HYDROcodone-acetaminophen, ipratropium-albuterol, ondansetron **OR** ondansetron (ZOFRAN) IV, sodium chloride flush, traZODone Medications Prior to Admission:  Prior to Admission medications   Medication Sig Start Date End Date Taking? Authorizing Provider  benzonatate (TESSALON) 200 MG capsule Take 1 capsule (200 mg total) 3 (three) times daily as needed by mouth for cough. Patient not taking: Reported on 09/27/2017 09/25/17   Henreitta Leber, MD  ferrous sulfate 325 (65 FE) MG tablet Take 1 tablet (325  mg total) daily with breakfast by mouth. Patient not taking: Reported on 09/27/2017 09/25/17   Henreitta Leber, MD  folic acid (FOLVITE) 1 MG tablet Take 1 tablet (1 mg total) daily by mouth. Patient not taking: Reported on 09/27/2017 09/25/17 10/25/17  Henreitta Leber, MD  Multiple Vitamin (MULTIVITAMIN WITH MINERALS) TABS tablet Take 1 tablet by mouth daily. Patient not taking: Reported on 09/24/2017 02/14/16   Nicholes Mango, MD  pantoprazole (PROTONIX) 40 MG tablet Take 1 tablet (40 mg total) 2 (two) times daily by mouth. Patient not taking: Reported on 09/27/2017 09/25/17   Henreitta Leber, MD  pentoxifylline (TRENTAL) 400 MG CR tablet Take 1 tablet (400 mg total) 3 (three) times daily with meals by mouth. Patient not taking: Reported on 09/27/2017 09/25/17 10/25/17  Henreitta Leber, MD  thiamine 100 MG tablet Take 1 tablet (100 mg total) daily by mouth. Patient not taking: Reported on 09/27/2017 09/25/17   Henreitta Leber, MD   Allergies  Allergen Reactions  . Codeine Nausea And Vomiting   Review of Systems  Gastrointestinal: Positive for abdominal distention. Negative for abdominal pain.  Neurological: Positive for weakness.   Physical Exam  Constitutional: He is  oriented to person, place, and time. He is easily aroused. He appears ill.  HENT:  Head: Normocephalic and atraumatic.  Eyes: Scleral icterus is present.  Cardiovascular: Regular rhythm.  Pulmonary/Chest: Effort normal.  Abdominal: He exhibits distension. There is no tenderness.  Neurological: He is alert, oriented to person, place, and time and easily aroused.  Skin: Skin is warm and dry.  jaundice  Psychiatric: He has a normal mood and affect. His behavior is normal.  Nursing note and vitals reviewed.  Vital Signs: BP 95/62 (BP Location: Left Arm)   Pulse 75   Temp 98 F (36.7 C) (Oral)   Resp 19   Ht '5\' 11"'$  (1.803 m)   Wt 98 kg (216 lb)   SpO2 98%   BMI 30.13 kg/m  Pain Assessment: No/denies pain POSS *See Group Information*: 1-Acceptable,Awake and alert Pain Score: 0-No pain   SpO2: SpO2: 98 % O2 Device:SpO2: 98 % O2 Flow Rate: .O2 Flow Rate (L/min): 2 L/min  IO: Intake/output summary:   Intake/Output Summary (Last 24 hours) at 10/01/2017 1511 Last data filed at 10/01/2017 1257 Gross per 24 hour  Intake 340 ml  Output 300 ml  Net 40 ml    LBM: Last BM Date: 09/30/17 Baseline Weight: Weight: 97.5 kg (215 lb) Most recent weight: Weight: 98 kg (216 lb)     Palliative Assessment/Data: PPS 50%   Flowsheet Rows     Most Recent Value  Intake Tab  Referral Department  Hospitalist  Unit at Time of Referral  Cardiac/Telemetry Unit  Palliative Care Primary Diagnosis  Other (Comment) [liver cirrhosis/failure]  Palliative Care Type  New Palliative care  Clinical Assessment  Palliative Performance Scale Score  50%  Psychosocial & Spiritual Assessment  Palliative Care Outcomes  Patient/Family meeting held?  Yes  Who was at the meeting?  patient  Palliative Care Outcomes  Clarified goals of care, ACP counseling assistance, Provided psychosocial or spiritual support, Provided end of life care assistance      Time In: 1400 Time Out: 1510 Time Total: 70  min Greater than 50%  of this time was spent counseling and coordinating care related to the above assessment and plan.  Signed by:  Ihor Dow, FNP-C Palliative Medicine Team  Phone: [phone] Fax: [phone]   Please contact  Palliative Medicine Team phone at [phone] for questions and concerns.  For individual provider: See Amion             

## 2017-10-01 NOTE — Care Management (Signed)
Patient's son Gerald Stabs at bedside. Son states "I just got out of jail yesterday, so that's why I didn't call you back last time".  Son understands that he will have to provide patient transportation at discharge, and is in agreement. He states that he no longer has a cell phone but can be reached at the home number listed.  RNCM informed son that previous admission patient was delivered a HINN 12, and that if the patient again declined to leave and did not have transportation he receive another HINN 12 for noticed of non coverage.

## 2017-10-01 NOTE — Progress Notes (Signed)
Per Dr. Leslye Peer do not give lasix as BP is 91/57. Also per MD if patient refuses to take lactulose orally RN is to place order for dose in same miligrams to be given rectally. Will place care order with these instructions.

## 2017-10-01 NOTE — Progress Notes (Signed)
Patient ID: Craig Maldonado, male   DOB: [birth year], 66 y.o.   MRN: [id number]   [facility] PROGRESS NOTE  Ghazi Rumpf OEV:[id number] DOB: August 16, 1951 DOA: 09/27/2017 PCP: Lucilla Lame, MD  HPI/Subjective: Patient stated that he just wants to be left alone.  He refused physical therapy.  He does not want to go to rehab.  He does not want to go home.  I explained that he can stay here forever and that we have to set up some sort of plan.  Objective: Vitals:   10/01/17 1102 10/01/17 1300  BP: (!) 91/57 95/62  Pulse:  75  Resp:  19  Temp:  98 F (36.7 C)  SpO2:  98%    Filed Weights   09/29/17 0600 09/30/17 0213 10/01/17 0606  Weight: 102.8 kg (226 lb 9.6 oz) 100.2 kg (220 lb 14.4 oz) 98 kg (216 lb)    ROS: Review of Systems  Unable to perform ROS: Acuity of condition  Respiratory: Negative for shortness of breath.   Cardiovascular: Negative for chest pain.  Gastrointestinal: Negative for abdominal pain.   Exam: Physical Exam  Constitutional: He is oriented to person, place, and time.  HENT:  Nose: No mucosal edema.  Mouth/Throat: No oropharyngeal exudate or posterior oropharyngeal edema.  Eyes: EOM and lids are normal. Pupils are equal, round, and reactive to light.  Conjunctiva jaundiced  Neck: Neck supple. No JVD present. Carotid bruit is not present. No tracheal deviation and no edema present. No thyroid mass and no thyromegaly present.  Cardiovascular: Regular rhythm, S1 normal and S2 normal. Exam reveals no gallop.  Murmur heard.  Systolic murmur is present with a grade of 4/6. Pulses:      Dorsalis pedis pulses are 2+ on the right side, and 2+ on the left side.  Respiratory: No accessory muscle usage. No respiratory distress. He has no wheezes. He has no rhonchi. He has no rales.  GI: Soft. Bowel sounds are normal. He exhibits no distension. There is no hepatosplenomegaly. There is no tenderness. There is no CVA tenderness.  Musculoskeletal:       Right ankle: He  exhibits no swelling.       Left ankle: He exhibits no swelling.  Lymphadenopathy:    He has no cervical adenopathy.    He has no axillary adenopathy.  Neurological: He is alert and oriented to person, place, and time. No cranial nerve deficit.  Skin: Skin is warm. No rash noted. Nails show no clubbing.  Jaundiced. Large callus right first toe  Psychiatric: He has a normal mood and affect.      Data Reviewed: Basic Metabolic Panel: Recent Labs  Lab 09/27/17 1513 09/28/17 0501 09/29/17 0451 09/30/17 0311 10/01/17 0250  NA 136 138 140 140 141  K 5.7* 3.7 3.2* 3.2* 3.2*  CL 103 105 106 104 103  CO2 22 24 26 27 29   GLUCOSE 97 118* 115* 101* 124*  BUN 15 18 16 15 20   CREATININE <0.30* UNABLE TO REPORT DUE TO ICTERUS INTERFERENCE UNABLE TO REPORT DUE TO ICTERUS UNABLE TO REPORT DUE TO ICTERUS UNABLE TO REPORT DUE TO ICTERUS  CALCIUM 8.6* 8.5* 8.8* 8.6* 8.7*   Liver Function Tests: Recent Labs  Lab 09/27/17 1513 09/28/17 0501 09/29/17 0451 09/30/17 0311 10/01/17 0250  AST 414* 330* 284* 237* 225*  ALT 113* 108* 101* 99* 100*  ALKPHOS 109 109 105 109 110  BILITOT 26.2* 26.3* 28.3* 27.6* 29.2*  PROT 6.4* 6.3* 6.6 6.2* 6.3*  ALBUMIN 2.9*  2.9* 2.8* 2.5* 2.4*   Recent Labs  Lab 09/27/17 1513  LIPASE 128*   Recent Labs  Lab 09/27/17 1513 09/28/17 0501  AMMONIA 73* 53*   CBC: Recent Labs  Lab 09/25/17 0346 09/27/17 1513 09/29/17 0451  WBC  --  10.8* 9.9  NEUTROABS  --   --  8.5*  HGB 7.5* 9.0* 8.8*  HCT  --  28.3* 27.5*  MCV  --  85.8 83.7  PLT  --  179 101*   BNP (last 3 results) Recent Labs    09/28/17 0501  BNP 458.0*     CBG: Recent Labs  Lab 09/28/17 0757  GLUCAP 118*    Recent Results (from the past 240 hour(s))  Culture, blood (routine x 2)     Status: None (Preliminary result)   Collection Time: 09/27/17  9:15 PM  Result Value Ref Range Status   Specimen Description BLOOD RIGHT ANTECUBITAL  Final   Special Requests   Final     BOTTLES DRAWN AEROBIC AND ANAEROBIC Blood Culture adequate volume   Culture NO GROWTH 4 DAYS  Final   Report Status PENDING  Incomplete  Culture, blood (routine x 2)     Status: None (Preliminary result)   Collection Time: 09/27/17  9:20 PM  Result Value Ref Range Status   Specimen Description BLOOD BLOOD RIGHT HAND  Final   Special Requests   Final    BOTTLES DRAWN AEROBIC AND ANAEROBIC Blood Culture adequate volume   Culture NO GROWTH 4 DAYS  Final   Report Status PENDING  Incomplete     Studies: Ct Head Wo Contrast  Result Date: 09/30/2017 CLINICAL DATA:  Altered level of consciousness. EXAM: CT HEAD WITHOUT CONTRAST TECHNIQUE: Contiguous axial images were obtained from the base of the skull through the vertex without intravenous contrast. COMPARISON:  02/11/2016 FINDINGS: Brain: Generalized atrophy. Chronic microvascular ischemic changes in the white matter. Chronic infarct left frontal lobe unchanged. Chronic could infarction left posterior basal ganglia unchanged. Negative for acute infarct.  Negative for hemorrhage or mass. Vascular: Negative for hyperdense vessel Skull: Negative Sinuses/Orbits: Mucoperiosteal thickening in the sphenoid sinus due to chronic sinusitis. Remaining sinuses clear. Negative orbit Other: None IMPRESSION: No acute abnormality. Atrophy and chronic ischemic changes stable from the prior CT. Electronically Signed   By: Franchot Gallo M.D.   On: 09/30/2017 09:07    Scheduled Meds: . ferrous sulfate  325 mg Oral Q breakfast  . folic acid  1 mg Oral Daily  . lactulose  30 g Oral Q4H  . mouth rinse  15 mL Mouth Rinse BID  . multivitamin with minerals  1 tablet Oral Daily  . pantoprazole  40 mg Oral BID  . pentoxifylline  400 mg Oral TID WC  . potassium chloride  20 mEq Oral BID  . rifaximin  550 mg Oral BID  . sodium chloride flush  3 mL Intravenous Q12H  . thiamine  100 mg Oral Daily   Continuous Infusions: . sodium chloride    . albumin human       Assessment/Plan:  1. Acute hepatic encephalopathy with elevated ammonia level.  Since the patient did not have many bowel movements over the last couple days will increase lactulose to every 4 hours until he has 3 bowel movements and can cut back the dosing.  If he does not take it orally he must take it rectally.  Patient on Xifaxan also. 2. Relative hypotension hold Lasix and nadolol. 3. Recent duodenal ulcer  and gastritis on endoscopy.  Continue iron and Protonix 4. Alcoholic cirrhosis with jaundice, thrombocytopenia, auto anticoagulation, ascites.  Continue nadolol.  No stay away from drinking.  Jaundice will take months to improve.  Subcutaneous vitamin K ordered. 5. Alcohol abuse on [place] protocol.  Thiamine daily. 6. Hypokalemia replace orally 7. Weakness.  Patient refused rehab and physical therapy evaluation 8. Appreciate palliative care consultation.  Since overall prognosis is poor  Code Status:     Code Status Orders  (From admission, onward)        Start     Ordered   09/27/17 2326  Full code  Continuous     09/27/17 2325    Code Status History    Date Active Date Inactive Code Status Order ID Comments User Context   09/21/2017 00:50 09/26/2017 19:27 Full Code [id number]  Lance Coon, MD ED   02/11/2016 07:36 02/22/2016 20:28 Full Code [id number]  Loletha Grayer, MD ED      Disposition Plan: Unclear at this point since the patient is high risk for readmission.  Patient refused rehab  Consultants:  Gastroenterology  Palliative care  Time spent: 25 minutes.  Left message for son  Loletha Grayer  [employer]

## 2017-10-01 NOTE — Progress Notes (Signed)
Craig Maldonado , MD [address], [place], [place], [place], [address] [address], [place], [place], [place], [address] Phone: [phone]  Fax: [phone]   Craig Maldonado is being followed for acute liver failure  Subjective:  Appears comfortable, denies any complaints. Ax0 x 1   Objective: Vital signs in last 24 hours: Vitals:   09/30/17 1129 09/30/17 1359 09/30/17 2030 10/01/17 0606  BP: 127/68  104/72 (!) 106/56  Pulse: 72  79 71  Resp: 20  16 18   Temp: 97.6 F (36.4 C)  (!) 97.5 F (36.4 C) 98.3 F (36.8 C)  TempSrc: Oral  Oral Oral  SpO2: 93% 93% 93% 99%  Weight:    216 lb (98 kg)  Height:       Weight change: -14.4 oz (-2.223 kg)  Intake/Output Summary (Last 24 hours) at 10/01/2017 4010 Last data filed at 10/01/2017 2725 Gross per 24 hour  Intake 340 ml  Output 300 ml  Net 40 ml     Exam: Heart:: Regular rate and rhythm, S1S2 present or without murmur or extra heart sounds Lungs: normal, clear to auscultation and clear to auscultation and percussion Abdomen: soft, nontender, normal bowel sounds   Lab Results: @LABTEST2 @ Micro Results: Recent Results (from the past 240 hour(s))  Culture, blood (routine x 2)     Status: None (Preliminary result)   Collection Time: 09/27/17  9:15 PM  Result Value Ref Range Status   Specimen Description BLOOD RIGHT ANTECUBITAL  Final   Special Requests   Final    BOTTLES DRAWN AEROBIC AND ANAEROBIC Blood Culture adequate volume   Culture NO GROWTH 4 DAYS  Final   Report Status PENDING  Incomplete  Culture, blood (routine x 2)     Status: None (Preliminary result)   Collection Time: 09/27/17  9:20 PM  Result Value Ref Range Status   Specimen Description BLOOD BLOOD RIGHT HAND  Final   Special Requests   Final    BOTTLES DRAWN AEROBIC AND ANAEROBIC Blood Culture adequate volume   Culture NO GROWTH 4 DAYS  Final   Report Status PENDING  Incomplete   Studies/Results: Ct Head Wo Contrast  Result Date:  09/30/2017 CLINICAL DATA:  Altered level of consciousness. EXAM: CT HEAD WITHOUT CONTRAST TECHNIQUE: Contiguous axial images were obtained from the base of the skull through the vertex without intravenous contrast. COMPARISON:  02/11/2016 FINDINGS: Brain: Generalized atrophy. Chronic microvascular ischemic changes in the white matter. Chronic infarct left frontal lobe unchanged. Chronic could infarction left posterior basal ganglia unchanged. Negative for acute infarct.  Negative for hemorrhage or mass. Vascular: Negative for hyperdense vessel Skull: Negative Sinuses/Orbits: Mucoperiosteal thickening in the sphenoid sinus due to chronic sinusitis. Remaining sinuses clear. Negative orbit Other: None IMPRESSION: No acute abnormality. Atrophy and chronic ischemic changes stable from the prior CT. Electronically Signed   By: Franchot Gallo M.D.   On: 09/30/2017 09:07   Medications: I have reviewed the patient's current medications. Scheduled Meds: . ferrous sulfate  325 mg Oral Q breakfast  . folic acid  1 mg Oral Daily  . furosemide  40 mg Intravenous Daily  . lactulose  30 g Oral TID  . mouth rinse  15 mL Mouth Rinse BID  . multivitamin with minerals  1 tablet Oral Daily  . nadolol  10 mg Oral QHS  . pantoprazole  40 mg Oral BID  . pentoxifylline  400 mg Oral TID WC  . phytonadione  10 mg Subcutaneous Daily  .  potassium chloride  20 mEq Oral BID  . sodium chloride flush  3 mL Intravenous Q12H  . thiamine  100 mg Oral Daily   Continuous Infusions: . sodium chloride    . albumin human     PRN Meds:.sodium chloride, acetaminophen **OR** acetaminophen, benzonatate, HYDROcodone-acetaminophen, ipratropium-albuterol, ondansetron **OR** ondansetron (ZOFRAN) IV, sodium chloride flush, traZODone   Assessment: Active Problems:   Hyperkalemia   Encephalopathy acute  Craig Hodgeis a 66 y.o.y/o malewith a history of decompensated alcoholic liver cirrhosis, ascites. Recently discharged with  alcoholic hepatitis, bleeding from a duodenal ulcer, Ongoing alcohol use, very poor prognosis. He is admitted with  liver failure and hepatic  Encephalopathy. USG doppler shows no thrombosis , minimal ascites(not able to sample), no hepatoma on USG. Tbilirubin and INR rising . No evidence of prior Hepatitis A/B/C. Urine drug screen positive for THC and opioids.    Plan  1.Multivitamins 2. F/u palliative care consult to determine goals of care.  3. Lactulose titrate to 2-3 soft bowel movements a day , avoid diarrhea as dehydration can worsen hepatic encephalopathy. Guide therapy clinically and not based on ammonia levels.  4. Low salt diet . 5.Not a candidate for liver transplant due to active alcohol abuse .  6 . Continue pentoxyphylline  7. Overall poor prognosis.  8. Limit narcotics can worsen encephelopathy 9. Add xifaxan for encephelopathy     LOS: 2 days   Craig Bellows, MD 10/01/2017, 9:17 AM

## 2017-10-01 NOTE — Progress Notes (Signed)
PT Cancellation Note  Patient Details Name: Craig Maldonado MRN: [id number] DOB: 17-Jul-1951   Cancelled Treatment:    Reason Eval/Treat Not Completed: Patient declined, no reason specified.  Upon PT entry, pt resting in bed and reporting he just did not "feel like it" in regards to participating in PT.  Pt encouraged to participate but pt continued to decline.  MD [place] notified and went into pt's room to speak with pt but pt continued to not be agreeable to therapy activities.  Will re-attempt PT eval at a later date/time.  Leitha Bleak, PT 10/01/17, 10:11 AM [phone]

## 2017-10-01 NOTE — Clinical Social Work Note (Signed)
CSW informed during progression that patient might now be willing to consider STR. CSW met with patient this morning and he adamantly continues to refuse STR. MD informed. RN CM informed. In addition, patient voices no desire to quit drinking alcohol at this time. Shela Leff MSW,LCSW

## 2017-10-02 LAB — CULTURE, BLOOD (ROUTINE X 2)
CULTURE: NO GROWTH
Culture: NO GROWTH
Special Requests: ADEQUATE
Special Requests: ADEQUATE

## 2017-10-02 LAB — VARICELLA-ZOSTER BY PCR: Varicella-Zoster, PCR: NEGATIVE

## 2017-10-02 MED ORDER — MAGNESIUM OXIDE 400 (241.3 MG) MG PO TABS
400.0000 mg | ORAL_TABLET | Freq: Every day | ORAL | Status: DC
Start: 2017-10-02 — End: 2017-10-03
  Administered 2017-10-02 – 2017-10-03 (×2): 400 mg via ORAL
  Filled 2017-10-02 (×2): qty 1

## 2017-10-02 MED ORDER — LACTULOSE 10 GM/15ML PO SOLN
30.0000 g | Freq: Three times a day (TID) | ORAL | Status: DC
  Administered 2017-10-02 – 2017-10-03 (×4): 30 g via ORAL
  Filled 2017-10-02 (×4): qty 60

## 2017-10-02 NOTE — Progress Notes (Signed)
Daily Progress Note   Patient Name: Craig Maldonado       Date: 10/02/2017 DOB: [birth year]  Age: 66 y.o. MRN#: [id number] Attending Physician: Loletha Grayer, MD Primary Care Physician: Lucilla Lame, MD Admit Date: 09/27/2017  Reason for Consultation/Follow-up: Establishing goals of care  Subjective/GOC: Called by RN to visit with patient and son, who is at bedside.   Upon arrival to room, son Craig Maldonado or known as Craig Maldonado) at bedside and irritable/tearful. I introduced myself and the role of palliative medicine in his dad's care. Craig Maldonado understands recurrent hospitalization secondary to alcohol liver cirrhosis. Craig Maldonado understands risk for further decompensation/rehospitalization because of progressive liver cirrhosis and the fact that is dad continues to drink. Also that he is not a candidate for a liver transplant because of continued alcohol abuse. Only can manage medically but will continue to progress. He becomes angry with his father during the conversation, speaking of him being discharged and "right back to the bottle."   I told Craig Maldonado of my conversation with Craig Maldonado yesterday including wanting to "go home and stay home" and not wanting resuscitation. Introduced hospice services if this is truly his father's wish to "go home and stay home." Craig Maldonado yells to Craig Maldonado, demanding he go to rehab if he wants to live. He does not agree with DNR code status and tells me he would want everything done for his father. Craig Maldonado is an only child and would be medical decision maker if Craig Maldonado did not have capacity). Craig Maldonado is appreciative of my conversation but tells me he has to leave at this time. He has my contact information.   Craig Maldonado tells me he will work with physical therapy tomorrow and will go to rehab on  discharge.   Length of Stay: 3  Current Medications: Scheduled Meds:  . ferrous sulfate  325 mg Oral Q breakfast  . folic acid  1 mg Oral Daily  . lactulose  30 g Oral TID  . mouth rinse  15 mL Mouth Rinse BID  . multivitamin with minerals  1 tablet Oral Daily  . pantoprazole  40 mg Oral BID  . pentoxifylline  400 mg Oral TID WC  . potassium chloride  20 mEq Oral BID  . rifaximin  550 mg Oral BID  . sodium chloride flush  3 mL Intravenous Q12H  .  thiamine  100 mg Oral Daily    Continuous Infusions: . sodium chloride    . albumin human      PRN Meds: sodium chloride, acetaminophen **OR** acetaminophen, benzonatate, HYDROcodone-acetaminophen, ipratropium-albuterol, ondansetron **OR** ondansetron (ZOFRAN) IV, sodium chloride flush, traZODone  Physical Exam  Constitutional: He is oriented to person, place, and time. He is cooperative.  HENT:  Head: Normocephalic and atraumatic.  Eyes: Scleral icterus is present.  Cardiovascular: Regular rhythm.  Pulmonary/Chest: Effort normal.  Abdominal: He exhibits distension. There is no tenderness.  Neurological: He is alert and oriented to person, place, and time.  Skin: Skin is warm and dry.  jaundice  Nursing note and vitals reviewed.          Vital Signs: BP 96/66 (BP Location: Left Arm)   Pulse 79   Temp 97.8 F (36.6 C) (Oral)   Resp 16   Ht 5\' 11"  (1.803 m)   Wt 98 kg (216 lb)   SpO2 97%   BMI 30.13 kg/m  SpO2: SpO2: 97 % O2 Device: O2 Device: Not Delivered O2 Flow Rate: O2 Flow Rate (L/min): 2 L/min  Intake/output summary:   Intake/Output Summary (Last 24 hours) at 10/02/2017 1552 Last data filed at 10/02/2017 1312 Gross per 24 hour  Intake 480 ml  Output 525 ml  Net -45 ml   LBM: Last BM Date: 10/01/17 Baseline Weight: Weight: 97.5 kg (215 lb) Most recent weight: Weight: 98 kg (216 lb)       Palliative Assessment/Data: PPS 50%   Flowsheet Rows     Most Recent Value  Intake Tab  Referral Department   Hospitalist  Unit at Time of Referral  Cardiac/Telemetry Unit  Palliative Care Primary Diagnosis  Other (Comment) [liver cirrhosis/failure]  Palliative Care Type  New Palliative care  Clinical Assessment  Palliative Performance Scale Score  50%  Psychosocial & Spiritual Assessment  Palliative Care Outcomes  Patient/Family meeting held?  Yes  Who was at the meeting?  patient  Palliative Care Outcomes  Clarified goals of care, ACP counseling assistance, Provided psychosocial or spiritual support, Provided end of life care assistance      Patient Active Problem List   Diagnosis Date Noted  . Abnormal LFTs   . Palliative care by specialist   . Encephalopathy acute 09/29/2017  . Hyperkalemia 09/27/2017  . Alcoholic cirrhosis of liver with ascites ([place]) 09/20/2017  . Alcohol abuse 09/20/2017  . Hyperbilirubinemia 09/20/2017  . Transaminitis 09/20/2017  . AKI (acute kidney injury) ([place]) 09/20/2017  . Gastrointestinal hemorrhage associated with anorectal source   . GI bleed 02/11/2016  . Blood in stool   . Esophageal varices without bleeding ([place])   . Esophagogastric ulcer   . Duodenitis   . Cardiac murmur 04/19/2015  . Back pain, chronic 04/19/2015  . Arthralgia of hip 04/19/2015  . Leg pain 04/19/2015  . Current tobacco use 04/19/2015  . Blood glucose elevated 04/19/2015  . Elevated blood sugar 04/19/2015  . Abnormal prostate specific antigen 04/19/2015  . Excoriated eczema 04/19/2015  . Breast development in males 04/19/2015  . Benign hypertension 04/19/2015  . Alcohol induced liver disorder ([place]) 04/19/2015  . Benign prostatic hypertrophy without urinary obstruction 04/19/2015  . Screening for depression 04/19/2015  . Cigarette smoker 04/19/2015  . Goals of care, counseling/discussion 04/19/2015    Palliative Care Assessment & Plan   Patient Profile: 66 y.o. male  with past medical history of liver cirrhosis, alcohol abuse, and gastric varices admitted on 09/27/2017  with abdominal distention  and confusion. Recent hospitalization for acute symptomatic anemia, acute GI bleed s/p EGD showing gastritis/duodenal ulceration, and acute kidney injury. Per chart review, patient with poor home situation with enabling family members. GI following. Patient with decompensated alcoholic liver cirrhosis with ascites and ongoing alcohol use. He is not a candidate for liver transplant and with overall poor prognosis. Receiving lactulose, pentoxifylline, and rifaximin. Palliative medicine consultation for goals of care.    Assessment: Acute hepatic encephalopathy Alcoholic cirrhosis with jaundice Thrombocytopenia Ascites ETOH abuse Hypokalemia Weakness  Recommendations/Plan:  FULL code/FULL scope  Discussed MOST form with patient and son. No decisions made. Son does not agree with DNR.  Son demanding his father discharge to rehab. Today, the patient is agreeable to work with physical therapy and agreeable with discharge to rehab. Patient has been non-compliant and I would not be surprised if he decides to go home again.   High risk for re-admission.   Goals of Care and Additional Recommendations:  Limitations on Scope of Treatment: Full Scope Treatment  Code Status: FULL   Code Status Orders  (From admission, onward)        Start     Ordered   09/27/17 2326  Full code  Continuous     09/27/17 2325    Code Status History    Date Active Date Inactive Code Status Order ID Comments User Context   09/21/2017 00:50 09/26/2017 19:27 Full Code [id number]  Lance Coon, MD ED   02/11/2016 07:36 02/22/2016 20:28 Full Code [id number]  Loletha Grayer, MD ED       Prognosis:   Unable to determine  Discharge Planning:  To Be Determined  Care plan was discussed with patient, son (Craig Maldonado), Dr. Leslye Peer, and RN  Thank you for allowing the Palliative Medicine Team to assist in the care of this patient.   Time In: 1535 Time Out: 1600 Total Time 22min  Prolonged Time Billed no      Greater than 50%  of this time was spent counseling and coordinating care related to the above assessment and plan.  Ihor Dow, FNP-C Palliative Medicine Team  Phone: [phone] Fax: [phone]  Please contact Palliative Medicine Team phone at [phone] for questions and concerns.

## 2017-10-02 NOTE — Evaluation (Signed)
Physical Therapy Evaluation Patient Details Name: Craig Maldonado MRN: [id number] DOB: September 25, 1951 Today's Date: 10/02/2017   History of Present Illness  Pt is a 66 year old male with past medical history of alcohol abuse, alcoholic liver cirrhosis who presented to the hospital last week after a fall and also increasing abdominal girth and pain.  He was discharged home and returned the next day (11/8) with confusion, high blood alcohol level, severely jaundiced and generally weak. Symptomatic anemia, GI bleed, liver cirrhosis with ascites, alcohol abuse with CIWA protocol, AKI, hypokalemia, and hyponatremia.   Clinical Impression  Pt not at all eager to participate with PT, but with the promise of a quick assessment for safety and that we would not miss too much of "The Price is Right" he agreed to do a little activity.  Pt needed assistance to get up to sitting EOB, was impulsive and unsafe with standing and was only able to tolerate ~30 ft of ambulation with staggering steps, forward flexed posture, and generally poor safety awareness with all functional acts.  Pt refuses going to rehab, may be willing to do HHPT.  Generally he is unsafe and will need increased attention and assist if he is to be safe at home.    Follow Up Recommendations SNF;Home health PT(will likely refuse, unsure if HHPT would be amenable)    Equipment Recommendations       Recommendations for Other Services       Precautions / Restrictions Precautions Precautions: Fall Restrictions Weight Bearing Restrictions: No      Mobility  Bed Mobility Overal bed mobility: Needs Assistance Bed Mobility: Supine to Sit     Supine to sit: Min assist;Mod assist     General bed mobility comments: Pt able to roll to his side and tried to get to upright, he ultimately needed significant assist to get to upright sitting  Transfers Overall transfer level: Needs assistance Equipment used: Rolling walker (2 wheeled) Transfers:  Sit to/from Stand Sit to Stand: Min guard         General transfer comment: Pt did not need direct assist to get to standing but struggled to get fully upright, showed general unsteadiness  Ambulation/Gait Ambulation/Gait assistance: Min assist Ambulation Distance (Feet): 30 Feet Assistive device: Rolling walker (2 wheeled)       General Gait Details: Pt with forward lean, choppy steps, poor awareness and heavy reliance on the walker.  Stairs            Wheelchair Mobility    Modified Rankin (Stroke Patients Only)       Balance Overall balance assessment: Needs assistance Sitting-balance support: Bilateral upper extremity supported Sitting balance-Leahy Scale: Good     Standing balance support: Bilateral upper extremity supported Standing balance-Leahy Scale: Poor Standing balance comment: Pt was not stable with standing, showed poor decision making and generally was not safe/stable with standing acts                             Pertinent Vitals/Pain Pain Assessment: (general soreness/head ache)    Home Living Family/patient expects to be discharged to:: Private residence Living Arrangements: Children Available Help at Discharge: Family;Available 24 hours/day Type of Home: House Home Access: Level entry     Home Layout: One level Home Equipment: Walker - 2 wheels;Walker - 4 wheels      Prior Function Level of Independence: Independent         Comments: reports he normally he  does his grocery shopping, etc - per today's performance that would be unsafe     Hand Dominance        Extremity/Trunk Assessment   Upper Extremity Assessment Upper Extremity Assessment: Generalized weakness;Overall [facility] for tasks assessed    Lower Extremity Assessment Lower Extremity Assessment: Generalized weakness;Overall WFL for tasks assessed       Communication   Communication: No difficulties  Cognition Arousal/Alertness: Awake/alert Behavior  During Therapy: Restless Overall Cognitive Status: Within Functional Limits for tasks assessed                                        General Comments      Exercises     Assessment/Plan    PT Assessment Patient needs continued PT services  PT Problem List Decreased strength;Decreased activity tolerance;Decreased balance;Decreased knowledge of use of DME       PT Treatment Interventions DME instruction;Gait training;Functional mobility training;Neuromuscular re-education;Balance training;Therapeutic exercise;Therapeutic activities;Patient/family education    PT Goals (Current goals can be found in the Care Plan section)  Acute Rehab PT Goals Patient Stated Goal: go home PT Goal Formulation: With patient Time For Goal Achievement: 10/16/17 Potential to Achieve Goals: Fair    Frequency Min 2X/week   Barriers to discharge        Co-evaluation               AM-PAC PT "6 Clicks" Daily Activity  Outcome Measure Difficulty turning over in bed (including adjusting bedclothes, sheets and blankets)?: A Little Difficulty moving from lying on back to sitting on the side of the bed? : Unable Difficulty sitting down on and standing up from a chair with arms (e.g., wheelchair, bedside commode, etc,.)?: A Lot Help needed moving to and from a bed to chair (including a wheelchair)?: A Little Help needed walking in hospital room?: A Lot Help needed climbing 3-5 steps with a railing? : Total 6 Click Score: 12    End of Session Equipment Utilized During Treatment: Gait belt Activity Tolerance: Patient limited by pain Patient left: in chair;with call bell/phone within reach;with nursing/sitter in room Nurse Communication: Mobility status PT Visit Diagnosis: Muscle weakness (generalized) (M62.81);Difficulty in walking, not elsewhere classified (R26.2)    Time: 2229-7989 PT Time Calculation (min) (ACUTE ONLY): 16 min   Charges:   PT Evaluation $PT Eval Low  Complexity: 1 Low     PT G Codes:   PT G-Codes **NOT FOR INPATIENT CLASS** Functional Assessment Tool Used: AM-PAC 6 Clicks Basic Mobility Functional Limitation: Mobility: Walking and moving around Mobility: Walking and Moving Around Current Status (Q1194): At least 60 percent but less than 80 percent impaired, limited or restricted Mobility: Walking and Moving Around Goal Status [phone]): At least 20 percent but less than 40 percent impaired, limited or restricted    Kreg Shropshire, DPT 10/02/2017, 12:14 PM

## 2017-10-02 NOTE — Progress Notes (Signed)
Okay per Dr. Leslye Peer for pt to have no IV access as it was pulled out overnight. Also per MD change Lactulose order to 3 times a day rather than every 4 hours.

## 2017-10-02 NOTE — Progress Notes (Signed)
Patient ID: Kim Lauver, male   DOB: [birth year], 66 y.o.   MRN: [id number]    [employer] PROGRESS NOTE  Hersel Mcmeen HYQ:[id number] DOB: 1950-12-16 DOA: 09/27/2017 PCP: Lucilla Lame, MD  HPI/Subjective: Patient told me this morning he did not want to go to rehab.  Feels weak.  Complains of pain to nursing staff but none to me.  Had diarrhea so lactulose was decreased.  Objective: Vitals:   10/02/17 0435 10/02/17 1349  BP: 118/64 96/66  Pulse: 73 79  Resp:  16  Temp: (!) 97.5 F (36.4 C) 97.8 F (36.6 C)  SpO2: 99% 97%    Filed Weights   09/29/17 0600 09/30/17 0213 10/01/17 0606  Weight: 102.8 kg (226 lb 9.6 oz) 100.2 kg (220 lb 14.4 oz) 98 kg (216 lb)    ROS: Review of Systems  Unable to perform ROS: Acuity of condition  Respiratory: Negative for shortness of breath.   Cardiovascular: Negative for chest pain.  Gastrointestinal: Positive for diarrhea. Negative for abdominal pain.   Exam: Physical Exam  Constitutional: He is oriented to person, place, and time.  HENT:  Nose: No mucosal edema.  Mouth/Throat: No oropharyngeal exudate or posterior oropharyngeal edema.  Eyes: EOM and lids are normal. Pupils are equal, round, and reactive to light.  Conjunctiva jaundiced  Neck: Neck supple. No JVD present. Carotid bruit is not present. No tracheal deviation and no edema present. No thyroid mass and no thyromegaly present.  Cardiovascular: Regular rhythm, S1 normal and S2 normal. Exam reveals no gallop.  Murmur heard.  Systolic murmur is present with a grade of 4/6. Pulses:      Dorsalis pedis pulses are 2+ on the right side, and 2+ on the left side.  Respiratory: No accessory muscle usage. No respiratory distress. He has no wheezes. He has no rhonchi. He has no rales.  GI: Soft. Bowel sounds are normal. He exhibits no distension. There is no hepatosplenomegaly. There is no tenderness. There is no CVA tenderness.  Musculoskeletal:       Right ankle: He exhibits no  swelling.       Left ankle: He exhibits no swelling.  Lymphadenopathy:    He has no cervical adenopathy.    He has no axillary adenopathy.  Neurological: He is alert and oriented to person, place, and time. No cranial nerve deficit.  Skin: Skin is warm. No rash noted. Nails show no clubbing.  Jaundiced. Large callus right first toe  Psychiatric: He has a normal mood and affect.      Data Reviewed: Basic Metabolic Panel: Recent Labs  Lab 09/27/17 1513 09/28/17 0501 09/29/17 0451 09/30/17 0311 10/01/17 0250  NA 136 138 140 140 141  K 5.7* 3.7 3.2* 3.2* 3.2*  CL 103 105 106 104 103  CO2 22 24 26 27 29   GLUCOSE 97 118* 115* 101* 124*  BUN 15 18 16 15 20   CREATININE <0.30* UNABLE TO REPORT DUE TO ICTERUS INTERFERENCE UNABLE TO REPORT DUE TO ICTERUS UNABLE TO REPORT DUE TO ICTERUS UNABLE TO REPORT DUE TO ICTERUS  CALCIUM 8.6* 8.5* 8.8* 8.6* 8.7*   Liver Function Tests: Recent Labs  Lab 09/27/17 1513 09/28/17 0501 09/29/17 0451 09/30/17 0311 10/01/17 0250  AST 414* 330* 284* 237* 225*  ALT 113* 108* 101* 99* 100*  ALKPHOS 109 109 105 109 110  BILITOT 26.2* 26.3* 28.3* 27.6* 29.2*  PROT 6.4* 6.3* 6.6 6.2* 6.3*  ALBUMIN 2.9* 2.9* 2.8* 2.5* 2.4*   Recent Labs  Lab 09/27/17  1513  LIPASE 128*   Recent Labs  Lab 09/27/17 1513 09/28/17 0501  AMMONIA 73* 53*   CBC: Recent Labs  Lab 09/27/17 1513 09/29/17 0451  WBC 10.8* 9.9  NEUTROABS  --  8.5*  HGB 9.0* 8.8*  HCT 28.3* 27.5*  MCV 85.8 83.7  PLT 179 101*   BNP (last 3 results) Recent Labs    09/28/17 0501  BNP 458.0*     CBG: Recent Labs  Lab 09/28/17 0757  GLUCAP 118*    Recent Results (from the past 240 hour(s))  Culture, blood (routine x 2)     Status: None   Collection Time: 09/27/17  9:15 PM  Result Value Ref Range Status   Specimen Description BLOOD RIGHT ANTECUBITAL  Final   Special Requests   Final    BOTTLES DRAWN AEROBIC AND ANAEROBIC Blood Culture adequate volume   Culture NO  GROWTH 5 DAYS  Final   Report Status 10/02/2017 FINAL  Final  Culture, blood (routine x 2)     Status: None   Collection Time: 09/27/17  9:20 PM  Result Value Ref Range Status   Specimen Description BLOOD BLOOD RIGHT HAND  Final   Special Requests   Final    BOTTLES DRAWN AEROBIC AND ANAEROBIC Blood Culture adequate volume   Culture NO GROWTH 5 DAYS  Final   Report Status 10/02/2017 FINAL  Final      Scheduled Meds: . ferrous sulfate  325 mg Oral Q breakfast  . folic acid  1 mg Oral Daily  . lactulose  30 g Oral TID  . mouth rinse  15 mL Mouth Rinse BID  . multivitamin with minerals  1 tablet Oral Daily  . pantoprazole  40 mg Oral BID  . pentoxifylline  400 mg Oral TID WC  . potassium chloride  20 mEq Oral BID  . rifaximin  550 mg Oral BID  . sodium chloride flush  3 mL Intravenous Q12H  . thiamine  100 mg Oral Daily   Continuous Infusions: . sodium chloride    . albumin human      Assessment/Plan:  1. Acute hepatic encephalopathy with elevated ammonia level.  Since the patient had 7 bowel movements, I decreased the lactulose to 3 times daily dosing.  Patient on Xifaxan also. 2. Relative hypotension hold Lasix and nadolol. 3. Recent duodenal ulcer and gastritis on endoscopy.  Continue iron and Protonix 4. Alcoholic cirrhosis with jaundice, thrombocytopenia, auto anticoagulation, ascites.  I advised the patient that he has to stop drinking.  I also told the son that he must stop drinking.  I told the son that he will die if he goes back to drinking.  Jaundice will take months to improve.  Subcutaneous vitamin K given 5. Alcohol abuse on Ciwa protocol.  Thiamine daily. 6. Hypokalemia replace orally, check magnesium in the morning 7. Weakness.  Patient refused rehab  but son will talk to him about this 8. Appreciate palliative care consultation.  Overall prognosis is poor with end-stage liver disease especially if he goes back to drinking.  Code Status:     Code Status  Orders  (From admission, onward)        Start     Ordered   09/27/17 2326  Full code  Continuous     09/27/17 2325    Code Status History    Date Active Date Inactive Code Status Order ID Comments User Context   09/21/2017 00:50 09/26/2017 19:27 Full Code [id number]  Jannifer Franklin,  Shanon Brow, MD ED   02/11/2016 07:36 02/22/2016 20:28 Full Code [id number]  Loletha Grayer, MD ED      Disposition Plan: Son stated he will talk with the patient about rehab  Consultants:  Gastroenterology  Palliative care  Time spent: 25 minutes.  Spoke with son on the phone  Loletha Grayer  [employer]

## 2017-10-03 ENCOUNTER — Telehealth: Payer: Self-pay | Admitting: Gastroenterology

## 2017-10-03 LAB — COMPREHENSIVE METABOLIC PANEL
ALBUMIN: 2.3 g/dL — AB (ref 3.5–5.0)
ALK PHOS: 111 U/L (ref 38–126)
ALT: 99 U/L — AB (ref 17–63)
AST: 244 U/L — AB (ref 15–41)
Anion gap: 8 (ref 5–15)
BUN: 25 mg/dL — AB (ref 6–20)
CALCIUM: 8.7 mg/dL — AB (ref 8.9–10.3)
CO2: 26 mmol/L (ref 22–32)
CREATININE: UNDETERMINED mg/dL (ref 0.61–1.24)
Chloride: 102 mmol/L (ref 101–111)
GLUCOSE: 104 mg/dL — AB (ref 65–99)
Potassium: 3.2 mmol/L — ABNORMAL LOW (ref 3.5–5.1)
SODIUM: 136 mmol/L (ref 135–145)
Total Bilirubin: 29.6 mg/dL (ref 0.3–1.2)
Total Protein: 6.3 g/dL — ABNORMAL LOW (ref 6.5–8.1)

## 2017-10-03 LAB — MAGNESIUM: Magnesium: 2 mg/dL (ref 1.7–2.4)

## 2017-10-03 LAB — AMMONIA: Ammonia: 19 umol/L (ref 9–35)

## 2017-10-03 MED ORDER — POTASSIUM CHLORIDE CRYS ER 20 MEQ PO TBCR: 20.0000 meq | EXTENDED_RELEASE_TABLET | Freq: Two times a day (BID) | ORAL | 0 refills | Status: DC

## 2017-10-03 MED ORDER — POTASSIUM CHLORIDE CRYS ER 20 MEQ PO TBCR
40.0000 meq | EXTENDED_RELEASE_TABLET | Freq: Once | ORAL | Status: AC
  Administered 2017-10-03: 40 meq via ORAL
  Filled 2017-10-03: qty 2

## 2017-10-03 MED ORDER — MAGNESIUM OXIDE 400 (241.3 MG) MG PO TABS: 400.0000 mg | ORAL_TABLET | Freq: Every day | ORAL | 0 refills | Status: DC

## 2017-10-03 MED ORDER — LACTULOSE 10 GM/15ML PO SOLN: 20.0000 g | Freq: Three times a day (TID) | ORAL | 0 refills | Status: DC

## 2017-10-03 MED ORDER — RIFAXIMIN 550 MG PO TABS: 550.0000 mg | ORAL_TABLET | Freq: Two times a day (BID) | ORAL | 0 refills | Status: DC

## 2017-10-03 NOTE — Discharge Instructions (Signed)
Heart healthy diet. Avoid alcohol

## 2017-10-03 NOTE — Telephone Encounter (Signed)
[facility]ospital [facility] LVM to schedule this patient a f/u 1 wk appt with Dr. Allen Norris.  LVM for patient to call us back.

## 2017-10-03 NOTE — Care Management Note (Signed)
Case Management Note  Patient Details  Name: Craig Maldonado MRN: [id number] Date of Birth: 09-19-1951   Patient's son notified of discharge, and will pick up.  Patient to discharge home today with resumption of home health services.   Malachy Mood with [facility] notified of discharge.  RNCM signing off.  Subjective/Objective:                    Action/Plan:   Expected Discharge Date:  10/03/17               Expected Discharge Plan:  [place]  In-House Referral:     Discharge planning Services  CM Consult  Post Acute Care Choice:  Resumption of Svcs/PTA Provider Choice offered to:     DME Arranged:    DME Agency:     HH Arranged:  RN, PT, Social Work, Nurse's Aide [place] Agency:  [employer]  Status of Service:  Completed, signed off  If discussed at [employer], dates discussed:    Additional Comments:  Beverly Sessions, RN 10/03/2017, 2:25 PM

## 2017-10-03 NOTE — Discharge Summary (Signed)
Craig Maldonado at [place] NAME: Craig Maldonado    MR#:  [id number]  DATE OF BIRTH:  June 08, 1951  DATE OF ADMISSION:  09/27/2017   ADMITTING PHYSICIAN: Gorden Harms, MD  DATE OF DISCHARGE: 10/03/2017  1:56 PM  PRIMARY CARE PHYSICIAN: Lucilla Lame, MD   ADMISSION DIAGNOSIS:  Confusion [R41.0] Generalized weakness [R53.1] LFTs abnormal [R94.5] DISCHARGE DIAGNOSIS:  Active Problems:   Goals of care, counseling/discussion   Hyperkalemia   Encephalopathy acute   LFTs abnormal   Palliative care by specialist  SECONDARY DIAGNOSIS:   Past Medical History:  Diagnosis Date  . Alcohol abuse   . Alcoholic cirrhosis ([place])   . Gastric varices    HOSPITAL COURSE:   1. Acute hepatic encephalopathy with elevated ammonia level.   Improved with lactulose and xifaxan.  Since the patient had 7 bowel movements, decreased the lactulose to 3 times daily dosing.  2. Relative hypotension hold Lasix and nadolol. 3. Recent duodenal ulcer and gastritis on endoscopy.  Continue iron and Protonix 4. Alcoholic cirrhosis with jaundice, thrombocytopenia, auto anticoagulation, ascites. Advised the patient that he has to stop drinking. Subcutaneous vitamin K given 5. Alcohol abuse on [place] protocol.  Thiamine daily. 6. Hypokalemia replaced orally, magnesium is normal. 7. Weakness.  Patient refused rehab. 8. Appreciate palliative care consultation.  Overall prognosis is poor with end-stage liver disease especially if he goes back to drinking.  DISCHARGE CONDITIONS:  Stable, discharged home with home health and PT today. CONSULTS OBTAINED:  Treatment Team:  Jonathon Bellows, MD DRUG ALLERGIES:   Allergies  Allergen Reactions  . Codeine Nausea And Vomiting   DISCHARGE MEDICATIONS:   Allergies as of 10/03/2017      Reactions   Codeine Nausea And Vomiting      Medication List    STOP taking these medications   benzonatate 200 MG capsule Commonly known as:   TESSALON     TAKE these medications   ferrous sulfate 325 (65 FE) MG tablet Take 1 tablet (325 mg total) daily with breakfast by mouth.   folic acid 1 MG tablet Commonly known as:  FOLVITE Take 1 tablet (1 mg total) daily by mouth.   lactulose 10 GM/15ML solution Commonly known as:  CHRONULAC Take 30 mLs (20 g total) 3 (three) times daily by mouth.   magnesium oxide 400 (241.3 Mg) MG tablet Commonly known as:  MAG-OX Take 1 tablet (400 mg total) daily by mouth.   multivitamin with minerals Tabs tablet Take 1 tablet by mouth daily.   pantoprazole 40 MG tablet Commonly known as:  PROTONIX Take 1 tablet (40 mg total) 2 (two) times daily by mouth.   pentoxifylline 400 MG CR tablet Commonly known as:  TRENTAL Take 1 tablet (400 mg total) 3 (three) times daily with meals by mouth.   potassium chloride SA 20 MEQ tablet Commonly known as:  K-DUR,KLOR-CON Take 1 tablet (20 mEq total) 2 (two) times daily by mouth.   rifaximin 550 MG Tabs tablet Commonly known as:  XIFAXAN Take 1 tablet (550 mg total) 2 (two) times daily by mouth.   thiamine 100 MG tablet Take 1 tablet (100 mg total) daily by mouth.        DISCHARGE INSTRUCTIONS:  See AVS.   If you experience worsening of your admission symptoms, develop shortness of breath, life threatening emergency, suicidal or homicidal thoughts you must seek medical attention immediately by calling 911 or calling your MD immediately  if  symptoms less severe.  You Must read complete instructions/literature along with all the possible adverse reactions/side effects for all the Medicines you take and that have been prescribed to you. Take any new Medicines after you have completely understood and accpet all the possible adverse reactions/side effects.   Please note  You were cared for by a hospitalist during your hospital stay. If you have any questions about your discharge medications or the care you received while you were in the  hospital after you are discharged, you can call the unit and asked to speak with the hospitalist on call if the hospitalist that took care of you is not available. Once you are discharged, your primary care physician will handle any further medical issues. Please note that NO REFILLS for any discharge medications will be authorized once you are discharged, as it is imperative that you return to your primary care physician (or establish a relationship with a primary care physician if you do not have one) for your aftercare needs so that they can reassess your need for medications and monitor your lab values.    On the day of Discharge:  VITAL SIGNS:  Blood pressure 105/65, pulse 84, temperature (!) 97.3 F (36.3 C), temperature source Oral, resp. rate 18, height 5\' 11"  (1.803 m), weight 207 lb 11.2 oz (94.2 kg), SpO2 96 %. PHYSICAL EXAMINATION:  GENERAL:  66 y.o.-year-old patient lying in the bed with no acute distress.  EYES: Pupils equal, round, reactive to light and accommodation. Has scleral icterus. Extraocular muscles intact.  HEENT: Head atraumatic, normocephalic. Oropharynx and nasopharynx clear.  NECK:  Supple, no jugular venous distention. No thyroid enlargement, no tenderness.  LUNGS: Normal breath sounds bilaterally, no wheezing, rales,rhonchi or crepitation. No use of accessory muscles of respiration.  CARDIOVASCULAR: S1, S2 normal. No murmurs, rubs, or gallops.  ABDOMEN: Soft, non-tender, non-distended. Bowel sounds present. No organomegaly or mass.  EXTREMITIES: No pedal edema, cyanosis, or clubbing.  NEUROLOGIC: Cranial nerves II through XII are intact. Muscle strength 4/5 in all extremities. Sensation intact. Gait not checked.  PSYCHIATRIC: The patient is alert and oriented x 3.  SKIN: No obvious rash, lesion, or ulcer.  Positive for jaundice. DATA REVIEW:   CBC Recent Labs  Lab 09/29/17 0451  WBC 9.9  HGB 8.8*  HCT 27.5*  PLT 101*    Chemistries  Recent Labs  Lab  10/03/17 0612  NA 136  K 3.2*  CL 102  CO2 26  GLUCOSE 104*  BUN 25*  CREATININE UNABLE TO REPORT DUE TO ICTERUS  CALCIUM 8.7*  MG 2.0  AST 244*  ALT 99*  ALKPHOS 111  BILITOT 29.6*     Microbiology Results  Results for orders placed or performed during the hospital encounter of 09/27/17  Culture, blood (routine x 2)     Status: None   Collection Time: 09/27/17  9:15 PM  Result Value Ref Range Status   Specimen Description BLOOD RIGHT ANTECUBITAL  Final   Special Requests   Final    BOTTLES DRAWN AEROBIC AND ANAEROBIC Blood Culture adequate volume   Culture NO GROWTH 5 DAYS  Final   Report Status 10/02/2017 FINAL  Final  Culture, blood (routine x 2)     Status: None   Collection Time: 09/27/17  9:20 PM  Result Value Ref Range Status   Specimen Description BLOOD BLOOD RIGHT HAND  Final   Special Requests   Final    BOTTLES DRAWN AEROBIC AND ANAEROBIC Blood Culture adequate volume  Culture NO GROWTH 5 DAYS  Final   Report Status 10/02/2017 FINAL  Final    RADIOLOGY:  No results found.   Management plans discussed with the patient, family and they are in agreement.  CODE STATUS: Prior   TOTAL TIME TAKING CARE OF THIS PATIENT: 33 minutes.    Demetrios Loll M.D on 10/03/2017 at 6:36 PM  Between 7am to 6pm - Pager - [phone]  After 6pm go to [email or web address] - [employer] [facility]  Office  [phone]  CC: Primary care physician; Lucilla Lame, MD   Note: This dictation was prepared with Dragon dictation along with smaller phrase technology. Any transcriptional errors that result from this process are unintentional.

## 2017-10-03 NOTE — Plan of Care (Signed)
Patient continues to progress with current plan of care.  While understanding is verbalized, there doesn't appear to be any evidence of learning.

## 2017-10-03 NOTE — Clinical Social Work Note (Signed)
CSW spoke with patient this morning due to being told that Palliative spoke with patient and he had agreed to go to rehab. Patient informed CSW that she misunderstood and he stated he would be willing to have PT rehab in the home. He stated he would NOT be going to a facility for rehab. Patient also verbalized that he wasn't going to drink anymore and that he son who continues to drink is going to help him. Patient not invested in his alcohol recovery. He declined outpatient alcohol rehab resources. Shela Leff MSW,LCSW [phone]

## 2017-10-03 NOTE — Progress Notes (Signed)
Discharge instructions reviewed with the patient and his son.  Patient sent out via wheelchair to his son's car

## 2017-10-04 ENCOUNTER — Telehealth: Payer: Self-pay

## 2017-10-04 ENCOUNTER — Encounter: Payer: Self-pay | Admitting: *Deleted

## 2017-10-04 ENCOUNTER — Other Ambulatory Visit: Payer: Self-pay

## 2017-10-04 ENCOUNTER — Inpatient Hospital Stay
Admission: EM | Admit: 2017-10-04 | Discharge: 2017-10-09 | DRG: 432 | Disposition: A | Discharge status: Skilled Nursing Facility | Payer: Medicare Other | Attending: Internal Medicine | Admitting: Internal Medicine

## 2017-10-04 DIAGNOSIS — F1010 Alcohol abuse, uncomplicated: Secondary | ICD-10-CM | POA: Diagnosis present

## 2017-10-04 DIAGNOSIS — K7011 Alcoholic hepatitis with ascites: Secondary | ICD-10-CM | POA: Diagnosis present

## 2017-10-04 DIAGNOSIS — M6281 Muscle weakness (generalized): Secondary | ICD-10-CM | POA: Diagnosis not present

## 2017-10-04 DIAGNOSIS — K2980 Duodenitis without bleeding: Secondary | ICD-10-CM | POA: Diagnosis present

## 2017-10-04 DIAGNOSIS — E876 Hypokalemia: Secondary | ICD-10-CM | POA: Diagnosis present

## 2017-10-04 DIAGNOSIS — Z5189 Encounter for other specified aftercare: Secondary | ICD-10-CM | POA: Diagnosis not present

## 2017-10-04 DIAGNOSIS — K852 Alcohol induced acute pancreatitis without necrosis or infection: Secondary | ICD-10-CM

## 2017-10-04 DIAGNOSIS — R188 Other ascites: Secondary | ICD-10-CM | POA: Diagnosis present

## 2017-10-04 DIAGNOSIS — Z885 Allergy status to narcotic agent status: Secondary | ICD-10-CM

## 2017-10-04 DIAGNOSIS — D529 Folate deficiency anemia, unspecified: Secondary | ICD-10-CM | POA: Diagnosis not present

## 2017-10-04 DIAGNOSIS — Z7401 Bed confinement status: Secondary | ICD-10-CM | POA: Diagnosis not present

## 2017-10-04 DIAGNOSIS — K7290 Hepatic failure, unspecified without coma: Secondary | ICD-10-CM | POA: Diagnosis present

## 2017-10-04 DIAGNOSIS — R600 Localized edema: Secondary | ICD-10-CM | POA: Diagnosis not present

## 2017-10-04 DIAGNOSIS — R17 Unspecified jaundice: Secondary | ICD-10-CM | POA: Diagnosis not present

## 2017-10-04 DIAGNOSIS — F17210 Nicotine dependence, cigarettes, uncomplicated: Secondary | ICD-10-CM | POA: Diagnosis present

## 2017-10-04 DIAGNOSIS — I959 Hypotension, unspecified: Secondary | ICD-10-CM | POA: Diagnosis present

## 2017-10-04 DIAGNOSIS — K7031 Alcoholic cirrhosis of liver with ascites: Secondary | ICD-10-CM | POA: Diagnosis not present

## 2017-10-04 DIAGNOSIS — K8521 Alcohol induced acute pancreatitis with uninfected necrosis: Secondary | ICD-10-CM | POA: Diagnosis not present

## 2017-10-04 DIAGNOSIS — Z7189 Other specified counseling: Secondary | ICD-10-CM | POA: Diagnosis not present

## 2017-10-04 DIAGNOSIS — R4182 Altered mental status, unspecified: Secondary | ICD-10-CM | POA: Diagnosis not present

## 2017-10-04 DIAGNOSIS — E871 Hypo-osmolality and hyponatremia: Secondary | ICD-10-CM | POA: Diagnosis present

## 2017-10-04 DIAGNOSIS — I864 Gastric varices: Secondary | ICD-10-CM | POA: Diagnosis not present

## 2017-10-04 DIAGNOSIS — K219 Gastro-esophageal reflux disease without esophagitis: Secondary | ICD-10-CM | POA: Diagnosis present

## 2017-10-04 DIAGNOSIS — E539 Vitamin B deficiency, unspecified: Secondary | ICD-10-CM | POA: Diagnosis not present

## 2017-10-04 DIAGNOSIS — I739 Peripheral vascular disease, unspecified: Secondary | ICD-10-CM | POA: Diagnosis not present

## 2017-10-04 DIAGNOSIS — K7030 Alcoholic cirrhosis of liver without ascites: Secondary | ICD-10-CM | POA: Diagnosis not present

## 2017-10-04 LAB — URINALYSIS, COMPLETE (UACMP) WITH MICROSCOPIC: SPECIFIC GRAVITY, URINE: 1.018 (ref 1.005–1.030)

## 2017-10-04 LAB — COMPREHENSIVE METABOLIC PANEL
ALT: 125 U/L — ABNORMAL HIGH (ref 17–63)
AST: 270 U/L — ABNORMAL HIGH (ref 15–41)
Albumin: 2.6 g/dL — ABNORMAL LOW (ref 3.5–5.0)
Alkaline Phosphatase: 145 U/L — ABNORMAL HIGH (ref 38–126)
Anion gap: 12 (ref 5–15)
BILIRUBIN TOTAL: 36.6 mg/dL — AB (ref 0.3–1.2)
BUN: 27 mg/dL — ABNORMAL HIGH (ref 6–20)
CHLORIDE: 99 mmol/L — AB (ref 101–111)
CO2: 21 mmol/L — ABNORMAL LOW (ref 22–32)
Calcium: 8.7 mg/dL — ABNORMAL LOW (ref 8.9–10.3)
Creatinine, Ser: UNDETERMINED mg/dL (ref 0.61–1.24)
Glucose, Bld: 178 mg/dL — ABNORMAL HIGH (ref 65–99)
POTASSIUM: 3 mmol/L — AB (ref 3.5–5.1)
Sodium: 132 mmol/L — ABNORMAL LOW (ref 135–145)
TOTAL PROTEIN: 7.3 g/dL (ref 6.5–8.1)

## 2017-10-04 LAB — CBC
HCT: 32.6 % — ABNORMAL LOW (ref 40.0–52.0)
Hemoglobin: 10.3 g/dL — ABNORMAL LOW (ref 13.0–18.0)
MCH: 27.1 pg (ref 26.0–34.0)
MCHC: 31.6 g/dL — AB (ref 32.0–36.0)
MCV: 85.7 fL (ref 80.0–100.0)
PLATELETS: 156 10*3/uL (ref 150–440)
RBC: 3.81 MIL/uL — AB (ref 4.40–5.90)
RDW: 32.2 % — AB (ref 11.5–14.5)
WBC: 19.9 10*3/uL — AB (ref 3.8–10.6)

## 2017-10-04 LAB — BODY FLUID CELL COUNT WITH DIFFERENTIAL
Eos, Fluid: 0 %
Lymphs, Fluid: 50 %
MONOCYTE-MACROPHAGE-SEROUS FLUID: 42 %
Neutrophil Count, Fluid: 8 %
Other Cells, Fluid: 0 %
Total Nucleated Cell Count, Fluid: 1314 cu mm

## 2017-10-04 LAB — PROTIME-INR
INR: 1.47
PROTHROMBIN TIME: 17.7 s — AB (ref 11.4–15.2)

## 2017-10-04 LAB — LIPASE, BLOOD: LIPASE: 292 U/L — AB (ref 11–51)

## 2017-10-04 MED ORDER — ONDANSETRON HCL 4 MG/2ML IJ SOLN
4.0000 mg | Freq: Four times a day (QID) | INTRAMUSCULAR | Status: DC | PRN
  Administered 2017-10-06 – 2017-10-09 (×2): 4 mg via INTRAVENOUS
  Filled 2017-10-04 (×2): qty 2

## 2017-10-04 MED ORDER — PANTOPRAZOLE SODIUM 40 MG PO TBEC
40.0000 mg | DELAYED_RELEASE_TABLET | Freq: Two times a day (BID) | ORAL | Status: DC
  Administered 2017-10-04 – 2017-10-08 (×9): 40 mg via ORAL
  Filled 2017-10-04 (×10): qty 1

## 2017-10-04 MED ORDER — FOLIC ACID 1 MG PO TABS
1.0000 mg | ORAL_TABLET | Freq: Every day | ORAL | Status: DC
  Administered 2017-10-04 – 2017-10-08 (×5): 1 mg via ORAL
  Filled 2017-10-04 (×6): qty 1

## 2017-10-04 MED ORDER — ONDANSETRON HCL 4 MG/2ML IJ SOLN
4.0000 mg | Freq: Once | INTRAMUSCULAR | Status: AC
  Administered 2017-10-04: 4 mg via INTRAVENOUS
  Filled 2017-10-04: qty 2

## 2017-10-04 MED ORDER — ONDANSETRON HCL 4 MG PO TABS: 4.0000 mg | ORAL_TABLET | Freq: Four times a day (QID) | ORAL | Status: DC | PRN

## 2017-10-04 MED ORDER — SODIUM CHLORIDE 0.9 % IV SOLN
INTRAVENOUS | Status: DC
  Administered 2017-10-04: 23:00:00 via INTRAVENOUS

## 2017-10-04 MED ORDER — POTASSIUM CHLORIDE CRYS ER 20 MEQ PO TBCR
20.0000 meq | EXTENDED_RELEASE_TABLET | Freq: Two times a day (BID) | ORAL | Status: DC
  Administered 2017-10-04 – 2017-10-08 (×9): 20 meq via ORAL
  Filled 2017-10-04 (×10): qty 1

## 2017-10-04 MED ORDER — MORPHINE SULFATE (PF) 2 MG/ML IV SOLN
2.0000 mg | INTRAVENOUS | Status: DC | PRN
  Administered 2017-10-04: 23:00:00 2 mg via INTRAVENOUS
  Filled 2017-10-04: qty 1

## 2017-10-04 MED ORDER — LIDOCAINE HCL (PF) 1 % IJ SOLN
5.0000 mL | Freq: Once | INTRAMUSCULAR | Status: AC
  Administered 2017-10-04: 5 mL via INTRADERMAL
  Filled 2017-10-04: qty 5

## 2017-10-04 MED ORDER — MORPHINE SULFATE (PF) 4 MG/ML IV SOLN
4.0000 mg | Freq: Once | INTRAVENOUS | Status: AC
  Administered 2017-10-04: 4 mg via INTRAVENOUS
  Filled 2017-10-04: qty 1

## 2017-10-04 MED ORDER — VITAMIN B-1 100 MG PO TABS
100.0000 mg | ORAL_TABLET | Freq: Every day | ORAL | Status: DC
  Administered 2017-10-04 – 2017-10-08 (×5): 100 mg via ORAL
  Filled 2017-10-04 (×6): qty 1

## 2017-10-04 MED ORDER — DEXTROSE 5 % IV SOLN
500.0000 mg | Freq: Three times a day (TID) | INTRAVENOUS | Status: DC
  Administered 2017-10-04 – 2017-10-09 (×14): 500 mg via INTRAVENOUS
  Filled 2017-10-04 (×16): qty 0.5

## 2017-10-04 NOTE — ED Notes (Addendum)
FIRST NURSE NOTE: Pt discharged from hospital yesterday; pt to ER today for diarrhea, stomach swelling. Pt home care nurse just left the residence. Pt skin jaundiced. Pt in wheelchair.

## 2017-10-04 NOTE — H&P (Signed)
[facility] at [place] NAME: Craig Maldonado    MR#:  [id number]  DATE OF BIRTH:  02-23-1951  DATE OF ADMISSION:  10/04/2017  PRIMARY CARE PHYSICIAN: Lucilla Lame, MD   REQUESTING/REFERRING PHYSICIAN: Dr. Mable Paris  CHIEF COMPLAINT:  Abdominal distention not feeling well along with vomiting  HISTORY OF PRESENT ILLNESS:  Craig Maldonado  is a 66 y.o. 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 And appears very weak.  He had some abdominal pain for which IV morphine was given.  He was found to have elevated white count of 19,000 and lipase of 292. ER physician did body fluid tap diagnostic and cell count is still pending.  It looked clear according to the ER MD. Given elevated white count I will start patient on IV cefepime empirically And is being admitted for further management.  No family in the ER. PAST MEDICAL HISTORY:   Past Medical History:  Diagnosis Date  . Alcohol abuse   . Alcoholic cirrhosis ([place])   . Gastric varices     PAST SURGICAL HISTOIRY:   Past Surgical History:  Procedure Laterality Date  . COLONOSCOPY WITH PROPOFOL N/A 02/12/2016   Procedure: COLONOSCOPY WITH PROPOFOL;  Surgeon: Lucilla Lame, MD;  Location: [facility] ENDOSCOPY;  Service: Endoscopy;  Laterality: N/A;  . ESOPHAGOGASTRODUODENOSCOPY (EGD) WITH PROPOFOL N/A 02/11/2016   Procedure: ESOPHAGOGASTRODUODENOSCOPY (EGD) WITH PROPOFOL;  Surgeon: Lucilla Lame, MD;  Location: [facility] ENDOSCOPY;  Service: Endoscopy;  Laterality: N/A;  . ESOPHAGOGASTRODUODENOSCOPY (EGD) WITH PROPOFOL N/A 09/23/2017   Procedure: ESOPHAGOGASTRODUODENOSCOPY (EGD) WITH PROPOFOL;  Surgeon: Toledo, Benay Pike, MD;  Location: [facility] ENDOSCOPY;  Service: Gastroenterology;  Laterality: N/A;  . PERIPHERAL VASCULAR CATHETERIZATION N/A  02/17/2016   Procedure: Visceral Angiography;  Surgeon: Algernon Huxley, MD;  Location: [facility] [facility];  Service: Cardiovascular;  Laterality: N/A;  . PERIPHERAL VASCULAR CATHETERIZATION N/A 02/17/2016   Procedure: Visceral Artery Intervention;  Surgeon: Algernon Huxley, MD;  Location: [facility];  Service: Cardiovascular;  Laterality: N/A;  . right leg surgery      SOCIAL HISTORY:   Social History   Tobacco Use  . Smoking status: Current Every Day Smoker    Packs/day: 0.50  . Smokeless tobacco: Never Used  Substance Use Topics  . Alcohol use: Yes    Alcohol/week: 3.6 oz    Types: 6 Cans of beer per week    FAMILY HISTORY:   Family History  Problem Relation Age of Onset  . Healthy Mother   . Tuberculosis Father     DRUG ALLERGIES:   Allergies  Allergen Reactions  . Codeine Nausea And Vomiting    REVIEW OF SYSTEMS:  Review of Systems  Constitutional: Negative for chills, fever and weight loss.  HENT: Negative for ear discharge, ear pain and nosebleeds.   Eyes: Negative for blurred vision, pain and discharge.  Respiratory: Negative for sputum production, shortness of breath, wheezing and stridor.   Cardiovascular: Positive for leg swelling. Negative for chest pain, palpitations, orthopnea and PND.  Gastrointestinal: Positive for abdominal pain and vomiting. Negative for diarrhea and nausea.  Genitourinary: Negative for frequency and urgency.  Musculoskeletal: Negative for back pain and joint pain.  Neurological: Positive for weakness. Negative for sensory change, speech change and focal weakness.  Psychiatric/Behavioral: Negative for depression and hallucinations. The patient is not  nervous/anxious.      MEDICATIONS AT HOME:   Prior to Admission medications   Medication Sig Start Date End Date Taking? Authorizing Provider  ferrous sulfate 325 (65 FE) MG tablet Take 1 tablet (325 mg total) daily with breakfast by mouth. Patient not taking: Reported on  09/27/2017 09/25/17   Henreitta Leber, MD  folic acid (FOLVITE) 1 MG tablet Take 1 tablet (1 mg total) daily by mouth. Patient not taking: Reported on 09/27/2017 09/25/17 10/25/17  Henreitta Leber, MD  lactulose (CHRONULAC) 10 GM/15ML solution Take 30 mLs (20 g total) 3 (three) times daily by mouth. 10/03/17   Demetrios Loll, MD  magnesium oxide (MAG-OX) 400 (241.3 Mg) MG tablet Take 1 tablet (400 mg total) daily by mouth. 10/03/17   Demetrios Loll, MD  Multiple Vitamin (MULTIVITAMIN WITH MINERALS) TABS tablet Take 1 tablet by mouth daily. Patient not taking: Reported on 09/24/2017 02/14/16   Nicholes Mango, MD  pantoprazole (PROTONIX) 40 MG tablet Take 1 tablet (40 mg total) 2 (two) times daily by mouth. Patient not taking: Reported on 09/27/2017 09/25/17   Henreitta Leber, MD  pentoxifylline (TRENTAL) 400 MG CR tablet Take 1 tablet (400 mg total) 3 (three) times daily with meals by mouth. Patient not taking: Reported on 09/27/2017 09/25/17 10/25/17  Henreitta Leber, MD  potassium chloride SA (K-DUR,KLOR-CON) 20 MEQ tablet Take 1 tablet (20 mEq total) 2 (two) times daily by mouth. 10/03/17   Demetrios Loll, MD  rifaximin (XIFAXAN) 550 MG TABS tablet Take 1 tablet (550 mg total) 2 (two) times daily by mouth. 10/03/17   Demetrios Loll, MD  thiamine 100 MG tablet Take 1 tablet (100 mg total) daily by mouth. Patient not taking: Reported on 09/27/2017 09/25/17   Henreitta Leber, MD      VITAL SIGNS:  Blood pressure 102/74, pulse 94, temperature 97.8 F (36.6 C), temperature source Oral, resp. rate 20, height 5\' 11"  (1.803 m), weight 93.9 kg (207 lb), SpO2 99 %.  PHYSICAL EXAMINATION:  GENERAL:  66 y.o.-year-old patient lying in the bed with no acute distress.  Chronically ill EYES: Pupils equal, round, reactive to light and accommodation.  Severe scleral icterus. Extraocular muscles intact.  HEENT: Head atraumatic, normocephalic. Oropharynx and nasopharynx clear.  NECK:  Supple, no jugular venous distention. No thyroid  enlargement, no tenderness.  LUNGS: Normal breath sounds bilaterally, no wheezing, rales,rhonchi or crepitation. No use of accessory muscles of respiration.  CARDIOVASCULAR: S1, S2 normal. No murmurs, rubs, or gallops.  ABDOMEN: Severely distended with a sciatic fluid thrill EXTREMITIES: No pedal edema, cyanosis, or clubbing.  NEUROLOGIC: Cranial nerves II through XII are intact. Muscle strength 5/5 in all extremities. Sensation intact. Gait not checked.  PSYCHIATRIC: The patient is alert and oriented x 3.  SKIN: Severe jaundice, has bruises to upper and lower extremity  LABORATORY PANEL:   CBC Recent Labs  Lab 10/04/17 1717  WBC 19.9*  HGB 10.3*  HCT 32.6*  PLT 156   ------------------------------------------------------------------------------------------------------------------  Chemistries  Recent Labs  Lab 10/03/17 0612 10/04/17 1717  NA 136 132*  K 3.2* 3.0*  CL 102 99*  CO2 26 21*  GLUCOSE 104* 178*  BUN 25* 27*  CREATININE UNABLE TO REPORT DUE TO ICTERUS UNABLE TO REPORT DUE TO ICTERUS  CALCIUM 8.7* 8.7*  MG 2.0  --   AST 244* 270*  ALT 99* 125*  ALKPHOS 111 145*  BILITOT 29.6* 36.6*   ------------------------------------------------------------------------------------------------------------------  Cardiac Enzymes No results for input(s): TROPONINI in  the last 168 hours. ------------------------------------------------------------------------------------------------------------------  RADIOLOGY:  No results found.  EKG:    IMPRESSION AND PLAN:   Craig Maldonado  is a 66 y.o. 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 And appears very weak.  He had some abdominal pain for which IV morphine was given.  He was found to have elevated white count of 19,000 and lipase of 292.  1.  End-stage  alcoholic cirrhosis of liver with recurrent ascites -Suspected SBP -Admit to medical floor -Therapeutic paracentesis tomorrow.  Patient has had last paracentesis 09/24/2017 2.6 L of fluid was removed -Continue lactulose, rifaximin and Protonix  2.  Suspected SBP -Empiric coverage with cefepime ascitic fluid cell count is available -If no evidence of SBP discontinue antibiotic  3.  Hypokalemia -Pharmacy to dose oral potassium  4.  History of varices and GERD -Continue PPI  5.  Elevated lipase likely has mild pancreatitis -Clear liquid diet -PRN pain meds  Palliative care has seen patient during last admission.  Patient is still a full code He understands poor prognosis. No family in the emergency room at present   All the records are reviewed and case discussed with ED provider. Management plans discussed with the patient, family and they are in agreement.  CODE STATUS: full  TOTAL TIME TAKING CARE OF THIS PATIENT: *50* minutes.    Fritzi Mandes M.D on 10/04/2017 at 8:07 PM  Between 7am to 6pm - Pager - [phone]  After 6pm go to [email or web address] - password [email or web address] [facility]  [facility]  Office  [phone]  CC: Primary care physician; Lucilla Lame, MD

## 2017-10-04 NOTE — Telephone Encounter (Signed)
I received a phone call from [facility], RN from [employer]. The phone call was concerning a request for rehab for a patient that was discharged from [facility] on yesterday. Miley stated the pt was discharged to soon. He was admitted for hepatic encephalopathy, alcoholic cirrhosis, jaundice and end-stage liver failure. She is very concern because the patient stomach is swollen w/ uncontrollable bowel movements. After speaking with Vaughan Basta and discussing the pt current state we agreed it was best that the pt go back to the ER. The pt is consider a new patient to [place] and the provide cannot right orders without having a face to face visit. She verbalize understanding.

## 2017-10-04 NOTE — ED Provider Notes (Signed)
[facility] Emergency Department Provider Note  ____________________________________________   First MD Initiated Contact with Patient 10/04/17 1900     (approximate)  I have reviewed the triage vital signs and the nursing notes.   HISTORY  Chief Complaint Abdominal Pain   HPI Craig Maldonado is a 66 y.o. male who self presents to the emergency department with severe diffuse abdominal pain nausea and vomiting.  She has a complex past medical history including alcoholic cirrhosis and was actually discharged from our hospital yesterday.  He states that ever since going home he has been unable to keep food down.  He last had a paracentesis in 2014 and he feels like his abdominal distention has progressed to the point.  Began insidiously has been slowly progressive.  Is worsened with eating and somewhat improved with rest.  Past Medical History:  Diagnosis Date  . Alcohol abuse   . Alcoholic cirrhosis ([place])   . Gastric varices     Patient Active Problem List   Diagnosis Date Noted  . Ascites 10/04/2017  . LFTs abnormal   . Palliative care by specialist   . Encephalopathy acute 09/29/2017  . Hyperkalemia 09/27/2017  . Alcoholic cirrhosis of liver with ascites ([place]) 09/20/2017  . Alcohol abuse 09/20/2017  . Hyperbilirubinemia 09/20/2017  . Transaminitis 09/20/2017  . AKI (acute kidney injury) ([place]) 09/20/2017  . Gastrointestinal hemorrhage associated with anorectal source   . GI bleed 02/11/2016  . Blood in stool   . Esophageal varices without bleeding ([place])   . Esophagogastric ulcer   . Duodenitis   . Cardiac murmur 04/19/2015  . Back pain, chronic 04/19/2015  . Arthralgia of hip 04/19/2015  . Leg pain 04/19/2015  . Current tobacco use 04/19/2015  . Blood glucose elevated 04/19/2015  . Elevated blood sugar 04/19/2015  . Abnormal prostate specific antigen 04/19/2015  . Excoriated eczema 04/19/2015  . Breast development in males 04/19/2015  .  Benign hypertension 04/19/2015  . Alcohol induced liver disorder ([place]) 04/19/2015  . Benign prostatic hypertrophy without urinary obstruction 04/19/2015  . Screening for depression 04/19/2015  . Cigarette smoker 04/19/2015  . Goals of care, counseling/discussion 04/19/2015    Past Surgical History:  Procedure Laterality Date  . COLONOSCOPY WITH PROPOFOL N/A 02/12/2016   Performed by Lucilla Lame, MD at [place]  . ESOPHAGOGASTRODUODENOSCOPY (EGD) WITH PROPOFOL N/A 09/23/2017   Performed by Efrain Sella, MD at [facility]  . ESOPHAGOGASTRODUODENOSCOPY (EGD) WITH PROPOFOL N/A 02/11/2016   Performed by Lucilla Lame, MD at [facility]  . right leg surgery    . Visceral Angiography N/A 02/17/2016   Performed by Algernon Huxley, MD at [facility]  . Visceral Artery Intervention N/A 02/17/2016   Performed by Algernon Huxley, MD at [facility]    Prior to Admission medications   Medication Sig Start Date End Date Taking? Authorizing Provider  ferrous sulfate 325 (65 FE) MG tablet Take 1 tablet (325 mg total) daily with breakfast by mouth. Patient not taking: Reported on 09/27/2017 09/25/17   Henreitta Leber, MD  folic acid (FOLVITE) 1 MG tablet Take 1 tablet (1 mg total) daily by mouth. Patient not taking: Reported on 09/27/2017 09/25/17 10/25/17  Henreitta Leber, MD  lactulose (CHRONULAC) 10 GM/15ML solution Take 30 mLs (20 g total) 3 (three) times daily by mouth. Patient not taking: Reported on 10/04/2017 10/03/17   Demetrios Loll, MD  magnesium oxide (MAG-OX) 400 (241.3 Mg) MG tablet Take  1 tablet (400 mg total) daily by mouth. Patient not taking: Reported on 10/04/2017 10/03/17   Demetrios Loll, MD  Multiple Vitamin (MULTIVITAMIN WITH MINERALS) TABS tablet Take 1 tablet by mouth daily. Patient not taking: Reported on 09/24/2017 02/14/16   Nicholes Mango, MD  pantoprazole (PROTONIX) 40 MG tablet Take 1 tablet (40 mg total) 2 (two) times daily by mouth. Patient not taking:  Reported on 09/27/2017 09/25/17   Henreitta Leber, MD  pentoxifylline (TRENTAL) 400 MG CR tablet Take 1 tablet (400 mg total) 3 (three) times daily with meals by mouth. Patient not taking: Reported on 09/27/2017 09/25/17 10/25/17  Henreitta Leber, MD  potassium chloride SA (K-DUR,KLOR-CON) 20 MEQ tablet Take 1 tablet (20 mEq total) 2 (two) times daily by mouth. Patient not taking: Reported on 10/04/2017 10/03/17   Demetrios Loll, MD  rifaximin (XIFAXAN) 550 MG TABS tablet Take 1 tablet (550 mg total) 2 (two) times daily by mouth. Patient not taking: Reported on 10/04/2017 10/03/17   Demetrios Loll, MD  thiamine 100 MG tablet Take 1 tablet (100 mg total) daily by mouth. Patient not taking: Reported on 09/27/2017 09/25/17   Henreitta Leber, MD    Allergies Codeine  Family History  Problem Relation Age of Onset  . Healthy Mother   . Tuberculosis Father     Social History Social History   Tobacco Use  . Smoking status: Former Smoker    Packs/day: 0.50    Years: 50.00    Pack years: 25.00    Types: Cigarettes  . Smokeless tobacco: Never Used  . Tobacco comment: pt states he quite about 2 months ago  Substance Use Topics  . Alcohol use: No    Frequency: Never    Comment: states he used to drink, but not now  . Drug use: No    Review of Systems Constitutional: No fever/chills Eyes: No visual changes. ENT: No sore throat. Cardiovascular: Denies chest pain. Respiratory: Denies shortness of breath. Gastrointestinal: Positive for abdominal pain.  Positive for nausea, positive for vomiting.  No diarrhea.  No constipation. Genitourinary: Negative for dysuria. Musculoskeletal: Negative for back pain. Skin: Negative for rash. Neurological: Negative for headaches, focal weakness or numbness.   ____________________________________________   PHYSICAL EXAM:  VITAL SIGNS: ED Triage Vitals  Enc Vitals Group     BP 10/04/17 1600 124/69     Pulse Rate 10/04/17 1600 (!) 103     Resp 10/04/17  1600 20     Temp 10/04/17 1600 98.7 F (37.1 C)     Temp Source 10/04/17 1600 Oral     SpO2 10/04/17 1600 98 %     Weight 10/04/17 1718 207 lb (93.9 kg)     Height 10/04/17 1718 5\' 11"  (1.803 m)     Head Circumference --      Peak Flow --      Pain Score 10/04/17 1718 7     Pain Loc --      Pain Edu? --      Excl. in [place]? --     Constitutional: Chronically ill-appearing slightly elevated respiratory rate Eyes: PERRL EOMI. severe icterus Head: Atraumatic. Nose: No congestion/rhinnorhea. Mouth/Throat: No trismus Neck: No stridor.   Cardiovascular: Normal rate, regular rhythm. Grossly normal heart sounds.  Good peripheral circulation. Respiratory: Increased respiratory effort.  No retractions. Lungs CTAB and moving good air Gastrointestinal: Distended and tense abdomen diffuse tenderness no frank peritonitis Musculoskeletal: Legs are equal in size Neurologic:. No gross focal neurologic deficits are  appreciated. Skin:  Skin is warm, dry and intact. No rash noted. Psychiatric: Appears somewhat confused    ____________________________________________   DIFFERENTIAL includes but not limited to  SBP, pancreatitis, sepsis ____________________________________________   LABS (all labs ordered are listed, but only abnormal results are displayed)  Labs Reviewed  LIPASE, BLOOD - Abnormal; Notable for the following components:      Result Value   Lipase 292 (*)    All other components within normal limits  COMPREHENSIVE METABOLIC PANEL - Abnormal; Notable for the following components:   Sodium 132 (*)    Potassium 3.0 (*)    Chloride 99 (*)    CO2 21 (*)    Glucose, Bld 178 (*)    BUN 27 (*)    Calcium 8.7 (*)    Albumin 2.6 (*)    AST 270 (*)    ALT 125 (*)    Alkaline Phosphatase 145 (*)    Total Bilirubin 36.6 (*)    All other components within normal limits  CBC - Abnormal; Notable for the following components:   WBC 19.9 (*)    RBC 3.81 (*)    Hemoglobin 10.3 (*)      HCT 32.6 (*)    MCHC 31.6 (*)    RDW 32.2 (*)    All other components within normal limits  URINALYSIS, COMPLETE (UACMP) WITH MICROSCOPIC - Abnormal; Notable for the following components:   Color, Urine BROWN (*)    APPearance TURBID (*)    Glucose, UA   (*)    Value: TEST NOT REPORTED DUE TO COLOR INTERFERENCE OF URINE PIGMENT   Hgb urine dipstick   (*)    Value: TEST NOT REPORTED DUE TO COLOR INTERFERENCE OF URINE PIGMENT   Bilirubin Urine   (*)    Value: TEST NOT REPORTED DUE TO COLOR INTERFERENCE OF URINE PIGMENT   Ketones, ur   (*)    Value: TEST NOT REPORTED DUE TO COLOR INTERFERENCE OF URINE PIGMENT   Protein, ur   (*)    Value: TEST NOT REPORTED DUE TO COLOR INTERFERENCE OF URINE PIGMENT   Nitrite   (*)    Value: TEST NOT REPORTED DUE TO COLOR INTERFERENCE OF URINE PIGMENT   Leukocytes, UA   (*)    Value: TEST NOT REPORTED DUE TO COLOR INTERFERENCE OF URINE PIGMENT   Bacteria, UA FEW (*)    Squamous Epithelial / LPF 0-5 (*)    All other components within normal limits  PROTIME-INR - Abnormal; Notable for the following components:   Prothrombin Time 17.7 (*)    All other components within normal limits  BODY FLUID CELL COUNT WITH DIFFERENTIAL - Abnormal; Notable for the following components:   Appearance, Fluid HAZY (*)    All other components within normal limits  CBC - Abnormal; Notable for the following components:   WBC 13.5 (*)    RBC 3.44 (*)    Hemoglobin 9.5 (*)    HCT 29.3 (*)    RDW 31.4 (*)    Platelets 108 (*)    All other components within normal limits  BASIC METABOLIC PANEL - Abnormal; Notable for the following components:   Sodium 131 (*)    Chloride 100 (*)    Glucose, Bld 133 (*)    BUN 32 (*)    Calcium 8.2 (*)    All other components within normal limits  AMMONIA - Abnormal; Notable for the following components:   Ammonia 38 (*)  All other components within normal limits  BODY FLUID CULTURE  MRSA PCR SCREENING  PATHOLOGIST SMEAR REVIEW   MAGNESIUM  PHOSPHORUS    Blood work reviewed and interpreted by me shows elevated lipase concerning for pancreatitis __________________________________________  EKG   ____________________________________________  RADIOLOGY   ____________________________________________   PROCEDURES  Procedure(s) performed: yes  .Paracentesis Date/Time: 10/04/2017 7:41 PM Performed by: Darel Hong, MD Authorized by: Darel Hong, MD   Consent:    Consent obtained:  Verbal   Consent given by:  Patient   Risks discussed:  Infection and pain   Alternatives discussed:  No treatment and delayed treatment Pre-procedure details:    Procedure purpose:  Diagnostic Anesthesia (see MAR for exact dosages):    Anesthesia method:  Local infiltration   Local anesthetic:  Lidocaine 1% w/o epi Procedure details:    Needle gauge:  18   Ultrasound guidance: yes     Puncture site:  R lower quadrant   Fluid appearance:  Amber   Dressing:  4x4 sterile gauze Post-procedure details:    Patient tolerance of procedure:  Tolerated well, no immediate complications Comments:     Total of 30cc taken off for diagnostics     Critical Care performed: no  Observation: no ____________________________________________   INITIAL IMPRESSION / ASSESSMENT AND PLAN / ED COURSE  Pertinent labs & imaging results that were available during my care of the patient were reviewed by me and considered in my medical decision making (see chart for details).  Patient arrives chronically ill-appearing with severe abdominal pain and to keep food or water down.  His lipase is 292 which is consistent with acute pancreatitis.  Given his tense abdomen and large amount of ascites on bedside ultrasound performed at bedside diagnostic paracentesis without difficulty however given the patient's inability to tolerate food by mouth he will require inpatient admission for IV fluids, IV pain medication, and IV antiemetics for his  pancreatitis.      ____________________________________________   FINAL CLINICAL IMPRESSION(S) / ED DIAGNOSES  Final diagnoses:  Alcohol-induced acute pancreatitis, unspecified complication status      NEW MEDICATIONS STARTED DURING THIS VISIT:  This SmartLink is deprecated. Use AVSMEDLIST instead to display the medication list for a patient.   Note:  This document was prepared using Dragon voice recognition software and may include unintentional dictation errors.     Darel Hong, MD 10/05/17 1513

## 2017-10-04 NOTE — ED Notes (Signed)
Lorrie RN, aware of bed assigned  

## 2017-10-04 NOTE — ED Notes (Addendum)
Bilirubin of 18.3 verbal readback from lab. Spoke with Dr. Kerman Passey who gives no further orders regarding critical bilirubin result.

## 2017-10-04 NOTE — ED Triage Notes (Signed)
Pt to triage via wheelchair.  Pt reports abd pain and distention for 4 weeks.  diarrhea today.  Pt yellow.  Hx cirrhosis.  Pt alert.

## 2017-10-04 NOTE — ED Notes (Signed)
Pt unable to void at this time. 

## 2017-10-05 LAB — BASIC METABOLIC PANEL
ANION GAP: 9 (ref 5–15)
BUN: 32 mg/dL — ABNORMAL HIGH (ref 6–20)
CHLORIDE: 100 mmol/L — AB (ref 101–111)
CO2: 22 mmol/L (ref 22–32)
Calcium: 8.2 mg/dL — ABNORMAL LOW (ref 8.9–10.3)
Creatinine, Ser: UNDETERMINED mg/dL (ref 0.61–1.24)
GLUCOSE: 133 mg/dL — AB (ref 65–99)
POTASSIUM: 3.7 mmol/L (ref 3.5–5.1)
SODIUM: 131 mmol/L — AB (ref 135–145)

## 2017-10-05 LAB — CBC
HEMATOCRIT: 29.3 % — AB (ref 40.0–52.0)
HEMOGLOBIN: 9.5 g/dL — AB (ref 13.0–18.0)
MCH: 27.7 pg (ref 26.0–34.0)
MCHC: 32.6 g/dL (ref 32.0–36.0)
MCV: 85.1 fL (ref 80.0–100.0)
Platelets: 108 10*3/uL — ABNORMAL LOW (ref 150–440)
RBC: 3.44 MIL/uL — AB (ref 4.40–5.90)
RDW: 31.4 % — ABNORMAL HIGH (ref 11.5–14.5)
WBC: 13.5 10*3/uL — ABNORMAL HIGH (ref 3.8–10.6)

## 2017-10-05 LAB — AMMONIA: AMMONIA: 38 umol/L — AB (ref 9–35)

## 2017-10-05 LAB — PATHOLOGIST SMEAR REVIEW

## 2017-10-05 LAB — MAGNESIUM: MAGNESIUM: 2.1 mg/dL (ref 1.7–2.4)

## 2017-10-05 LAB — MRSA PCR SCREENING: MRSA BY PCR: NEGATIVE

## 2017-10-05 LAB — PHOSPHORUS: Phosphorus: UNDETERMINED mg/dL (ref 2.5–4.6)

## 2017-10-05 MED ORDER — PENTOXIFYLLINE ER 400 MG PO TBCR
400.0000 mg | EXTENDED_RELEASE_TABLET | Freq: Three times a day (TID) | ORAL | Status: DC
  Administered 2017-10-05 – 2017-10-08 (×10): 400 mg via ORAL
  Filled 2017-10-05 (×14): qty 1

## 2017-10-05 MED ORDER — SODIUM CHLORIDE 0.9 % IV BOLUS (SEPSIS)
500.0000 mL | Freq: Once | INTRAVENOUS | Status: AC
  Administered 2017-10-05: 500 mL via INTRAVENOUS

## 2017-10-05 MED ORDER — MAGNESIUM OXIDE 400 (241.3 MG) MG PO TABS
400.0000 mg | ORAL_TABLET | Freq: Every day | ORAL | Status: DC
  Administered 2017-10-05 – 2017-10-08 (×4): 400 mg via ORAL
  Filled 2017-10-05 (×4): qty 1

## 2017-10-05 MED ORDER — LACTULOSE 10 GM/15ML PO SOLN
20.0000 g | Freq: Three times a day (TID) | ORAL | Status: DC
  Administered 2017-10-05 – 2017-10-08 (×12): 20 g via ORAL
  Filled 2017-10-05 (×13): qty 30

## 2017-10-05 MED ORDER — OXYCODONE-ACETAMINOPHEN 5-325 MG PO TABS
1.0000 | ORAL_TABLET | Freq: Three times a day (TID) | ORAL | Status: DC | PRN
  Administered 2017-10-05 – 2017-10-06 (×4): 1 via ORAL
  Filled 2017-10-05 (×5): qty 1

## 2017-10-05 MED ORDER — RIFAXIMIN 550 MG PO TABS
550.0000 mg | ORAL_TABLET | Freq: Two times a day (BID) | ORAL | Status: DC
  Administered 2017-10-05 – 2017-10-08 (×8): 550 mg via ORAL
  Filled 2017-10-05 (×9): qty 1

## 2017-10-05 NOTE — Progress Notes (Addendum)
[facility] at [place] NAME: Craig Maldonado    MR#:  [id number]  DATE OF BIRTH:  1951/04/02  SUBJECTIVE:  Patient was discharged on November 14 due to hepatic encephalopathy. He has known liver cirrhosis and jaundice. He continues to drink EtOH. He presented to ED with abdominal pain. He continues to have abdominal pain. He denies fevers.    REVIEW OF SYSTEMS:    Review of Systems  Constitutional: Negative for fever, chills weight loss HENT: Negative for ear pain, nosebleeds, congestion, facial swelling, rhinorrhea, neck pain, neck stiffness and ear discharge.   Respiratory: Negative for cough, shortness of breath, wheezing  Cardiovascular: Negative for chest pain, palpitations and leg swelling.  Gastrointestinal: Positive abdominal pain. Positive history of duodenal ulcer. Denies melena or hematochezia.  Genitourinary: Negative for dysuria, urgency, frequency, hematuria Musculoskeletal: Negative for back pain or joint pain Neurological: Negative for dizziness, seizures, syncope, focal weakness,  numbness and headaches.  Hematological: Does not bruise/bleed easily.  Psychiatric/Behavioral: Negative for hallucinations, confusion, dysphoric mood    Tolerating Diet: yes      DRUG ALLERGIES:   Allergies  Allergen Reactions  . Codeine Nausea And Vomiting    VITALS:  Blood pressure (!) 86/60, pulse 79, temperature 98.2 F (36.8 C), temperature source Oral, resp. rate 18, height 5\' 10"  (1.778 m), weight 96.3 kg (212 lb 6 oz), SpO2 99 %.  PHYSICAL EXAMINATION:  Constitutional: Appears well-developed and well-nourished. No distress. HENT: Normocephalic. Marland Kitchen Oropharynx is clear and moist.  Eyes: Conjunctivae and EOM are normal. PERRLA, +++scleral icterus.  Neck: Normal ROM. Neck supple. No JVD. No tracheal deviation. CVS: RRR, S1/S2 +, no murmurs, no gallops, no carotid bruit.  Pulmonary: Effort and breath sounds normal, no stridor, rhonchi,  wheezes, rales.  Abdominal: Abdomen grossly distended and tense no rebound or guarding  Musculoskeletal: Normal range of motion. 1+ edema and no tenderness.  Neuro: Alert. CN 2-12 grossly intact. No focal deficits. Skin: Skin is warm and dry. No rash noted. Very jaundice  Psychiatric: Flat affect     LABORATORY PANEL:   CBC Recent Labs  Lab 10/05/17 0420  WBC 13.5*  HGB 9.5*  HCT 29.3*  PLT 108*   ------------------------------------------------------------------------------------------------------------------  Chemistries  Recent Labs  Lab 10/04/17 1717 10/05/17 0923  NA 132* 131*  K 3.0* 3.7  CL 99* 100*  CO2 21* 22  GLUCOSE 178* 133*  BUN 27* 32*  CREATININE UNABLE TO REPORT DUE TO ICTERUS  UNABLE TO REPORT DUE TO ICTERUS SDR  CALCIUM 8.7* 8.2*  MG  --  2.1  AST 270*  --   ALT 125*  --   ALKPHOS 145*  --   BILITOT 36.6*  --    ------------------------------------------------------------------------------------------------------------------  Cardiac Enzymes No results for input(s): TROPONINI in the last 168 hours. ------------------------------------------------------------------------------------------------------------------  RADIOLOGY:  No results found.   ASSESSMENT AND PLAN:   66 year old male with history of EtOH liver cirrhosis child's PU[facility] C with ongoing EtOH abuse, portal hypertensive gastropathy, ascites, esophageal varices with recent EGD showing grade a esophagitis, gastritis and duodenitis with visible vessel was discharged on 11/14 for hepatic encephalopathy and presents to the emergency room this time complaining of abdominal pain.  1. EtOH liver cirrhosis with recurrent ascites with suspected SBP/ chronic jaundice, thrombocytopenia Plan for paracentesis today Continue cefepime GI consultation Continue lactulose, rifaximin  Check ammonia level Follow final cultures from that side diagnostic paracentesis He is supposed to be on TRENTAL  until 12/6 for etoh  hepatitis   2. Hypotension: Last hospital stay blood pressure medications including Lasix and nadolol were discontinued Will provide 500 normal saline bolus over 2 hours Monitor  blood pressure  3. EtOH abuse: CIWA protocol  4. Hypokalemia: Repleted and recheck in a.m.  5. Palliative care: Patient was evaluated by palliative care during last hospital stay. Patient wants full code Can consider reconsult if needed. Patient has overall very poor prognosis and he is aware of this.  6. Recent EGD showing duodenitis with visible vessel: Continue PPI   Management plans discussed with the patient and he is in agreement.  CODE STATUS: FULL  TOTAL TIME TAKING CARE OF THIS PATIENT: 31 minutes.     POSSIBLE D/C 2-4 days, DEPENDING ON CLINICAL CONDITION.   Yuriel Lopezmartinez M.D on 10/05/2017 at 10:52 AM  Between 7am to 6pm - Pager - [phone] After 6pm go to [email or web address] - password [email or web address] [facility]  Office  [phone]  CC: Primary care physician; Lucilla Lame, MD  Note: This dictation was prepared with Dragon dictation along with smaller phrase technology. Any transcriptional errors that result from this process are unintentional.

## 2017-10-05 NOTE — Consult Note (Addendum)
Vonda Antigua, MD [address], [place], [place], [place], [address] [address], [place], [place], [place], [address] Phone: [phone]  Fax: [phone]  Consultation  Referring Provider:   Dr. Benjie Karvonen   Primary Care Physician:  Lucilla Lame, MD Primary Gastroenterologist:  Virgel Manifold, MD        Reason for Consultation:     Alcoholic cirrhosis  Date of Admission:  10/04/2017 Date of Consultation:  10/06/17         HPI:   Craig Maldonado is a 66 y.o. male with known history of decompensated alcoholic liver cirrhosis with multiple repeated admissions for abdominal distension, encephalopathy admitted with abdominal pain (diffuse, nonradiating, 5/10, relieved with morphine, of one day duration) and nausea vomiting.  Last paracentesis was on 11/5 and 2.6 L were removed.  Patient continues to actively drink.  He was recently discharged 2 days ago on 11/14 and treated during admission for acute hepatic encephalopathy which improved with lactulose and rifaximin.  Multiple notes during that admission report that patient's poor prognosis was discussed with him and he was encouraged to quit drinking.  Prior to this he was discharged on 11/ 7 and on that admission he had an EGD on 11/ 4 which noted grade a esophagitis, gastritis, duodenal ulcer with visible vessel which was treated with coagulation.  Bilirubin was noted to be 19.6 on that admission and patient was started on pentoxifylline which he is still on.  Ultrasound Doppler on his most recent admission showed no thrombosis, minimal ascites and no hepatoma.  Urine drug screen during that admission was positive for THC and opioids.  Viral hepatitis testing was negative.  Past Medical History:  Diagnosis Date  . Alcohol abuse   . Alcoholic cirrhosis ([place])   . Gastric varices     Past Surgical History:  Procedure Laterality Date  . COLONOSCOPY WITH PROPOFOL N/A 02/12/2016   Performed by Lucilla Lame, MD at [facility]  .  ESOPHAGOGASTRODUODENOSCOPY (EGD) WITH PROPOFOL N/A 09/23/2017   Performed by Efrain Sella, MD at [facility]  . ESOPHAGOGASTRODUODENOSCOPY (EGD) WITH PROPOFOL N/A 02/11/2016   Performed by Lucilla Lame, MD at [facility]  . right leg surgery    . Visceral Angiography N/A 02/17/2016   Performed by Algernon Huxley, MD at [facility]  . Visceral Artery Intervention N/A 02/17/2016   Performed by Algernon Huxley, MD at [facility]    Prior to Admission medications   Medication Sig Start Date End Date Taking? Authorizing Provider  ferrous sulfate 325 (65 FE) MG tablet Take 1 tablet (325 mg total) daily with breakfast by mouth. Patient not taking: Reported on 09/27/2017 09/25/17   Henreitta Leber, MD  folic acid (FOLVITE) 1 MG tablet Take 1 tablet (1 mg total) daily by mouth. Patient not taking: Reported on 09/27/2017 09/25/17 10/25/17  Henreitta Leber, MD  lactulose (CHRONULAC) 10 GM/15ML solution Take 30 mLs (20 g total) 3 (three) times daily by mouth. Patient not taking: Reported on 10/04/2017 10/03/17   Demetrios Loll, MD  magnesium oxide (MAG-OX) 400 (241.3 Mg) MG tablet Take 1 tablet (400 mg total) daily by mouth. Patient not taking: Reported on 10/04/2017 10/03/17   Demetrios Loll, MD  Multiple Vitamin (MULTIVITAMIN WITH MINERALS) TABS tablet Take 1 tablet by mouth daily. Patient not taking: Reported on 09/24/2017 02/14/16   Nicholes Mango, MD  pantoprazole (PROTONIX) 40 MG tablet Take 1 tablet (40 mg total) 2 (two) times daily  by mouth. Patient not taking: Reported on 09/27/2017 09/25/17   Henreitta Leber, MD  pentoxifylline (TRENTAL) 400 MG CR tablet Take 1 tablet (400 mg total) 3 (three) times daily with meals by mouth. Patient not taking: Reported on 09/27/2017 09/25/17 10/25/17  Henreitta Leber, MD  potassium chloride SA (K-DUR,KLOR-CON) 20 MEQ tablet Take 1 tablet (20 mEq total) 2 (two) times daily by mouth. Patient not taking: Reported on 10/04/2017 10/03/17   Demetrios Loll, MD    rifaximin (XIFAXAN) 550 MG TABS tablet Take 1 tablet (550 mg total) 2 (two) times daily by mouth. Patient not taking: Reported on 10/04/2017 10/03/17   Demetrios Loll, MD  thiamine 100 MG tablet Take 1 tablet (100 mg total) daily by mouth. Patient not taking: Reported on 09/27/2017 09/25/17   Henreitta Leber, MD    Family History  Problem Relation Age of Onset  . Healthy Mother   . Tuberculosis Father      Social History   Tobacco Use  . Smoking status: Former Smoker    Packs/day: 0.50    Years: 50.00    Pack years: 25.00    Types: Cigarettes  . Smokeless tobacco: Never Used  . Tobacco comment: pt states he quite about 2 months ago  Substance Use Topics  . Alcohol use: No    Frequency: Never    Comment: states he used to drink, but not now  . Drug use: No    Allergies as of 10/04/2017 - Review Complete 10/04/2017  Allergen Reaction Noted  . Codeine Nausea And Vomiting 04/19/2015    Review of Systems:    All systems reviewed and negative except where noted in HPI.   Physical Exam:  Vital signs in last 24 hours: Temp:  [97.5 F (36.4 C)-98.2 F (36.8 C)] 98 F (36.7 C) (11/16 1922) Pulse Rate:  [77-80] 79 (11/16 1922) Resp:  [18-20] 20 (11/16 1922) BP: (82-100)/(49-62) 95/57 (11/16 1922) SpO2:  [95 %-99 %] 97 % (11/16 1922) Last BM Date: 10/04/17 General:   Pleasant, cooperative in NAD Head:  Normocephalic and atraumatic. Eyes:   No icterus.   Conjunctiva pink. PERRLA. Ears:  Normal auditory acuity. Neck:  Supple; no masses or thyroidomegaly Lungs: Respirations even and unlabored. Lungs clear to auscultation bilaterally.   No wheezes, crackles, or rhonchi.  Heart:  Regular rate and rhythm;  Without murmur, clicks, rubs or gallops Abdomen:  Soft, distended, nontender. Normal bowel sounds. No appreciable masses or hepatomegaly.  No rebound or guarding.  Neurologic:  Alert and oriented x3;  grossly normal neurologically. Skin:  Intact without significant lesions or  rashes. Cervical Nodes:  No significant cervical adenopathy. Psych:  Alert and cooperative. Normal affect.  LAB RESULTS: Recent Labs    10/04/17 1717 10/05/17 0420  WBC 19.9* 13.5*  HGB 10.3* 9.5*  HCT 32.6* 29.3*  PLT 156 108*   BMET Recent Labs    10/03/17 0612 10/04/17 1717 10/05/17 0923  NA 136 132* 131*  K 3.2* 3.0* 3.7  CL 102 99* 100*  CO2 26 21* 22  GLUCOSE 104* 178* 133*  BUN 25* 27* 32*  CREATININE UNABLE TO REPORT DUE TO ICTERUS UNABLE TO REPORT DUE TO ICTERUS  UNABLE TO REPORT DUE TO ICTERUS SDR  CALCIUM 8.7* 8.7* 8.2*   LFT Recent Labs    10/04/17 1717  PROT 7.3  ALBUMIN 2.6*  AST 270*  ALT 125*  ALKPHOS 145*  BILITOT 36.6*   PT/INR Recent Labs    10/04/17 1911  LABPROT 17.7*  INR 1.47    STUDIES: No results found.  Diagnostic paracentesis was performed in the ER at 30 cc of fluid removed Fluid studies show a white cell count of 1300 but neutrophils of 8% and thus <250 WBCs which would be the diagnostic criteria for SBP   Impression / Plan:   Craig Maldonado is a 66 y.o. y/o male with active alcohol use, alcoholic liver cirrhosis, multiple admissions for abdominal distention and encephalopathy presents with abdominal distention and abdominal pain and continued rising bilirubin   Very poor prognosis with ongoing alcohol use and increasing bilirubin.  Unable to calculate MELD as creatinine not available due to elevated Bilirubin.  Child Pugh Class C. Would recommend, IV thiamine, lactulose and rifaximin Maintain good PO nutrition Okay to continue pentoxifylline.  Data for benefit of pentoxifylline is limited however, in patients who cannot be given steroids due to contraindications or concern for noncompliance with follow-up (to taper the steroids appropriately), pentoxifylline can be used, as in this case. Recent workup for viral hepatitis, ultrasound Doppler, was negative for any other etiology of elevated bilirubin besides alcoholic hepatitis.   Bilirubin has not peaked, however, INR and albumin have stayed stable. If patient shows signs of acute decompensation, primary team to consider transfer to a liver transplant center.  Candidacy for liver transplant will need to be determined by an official liver transplant team.  However, his current alcohol use would likely play a role in their evaluation of the patient. Pt. Was encouraged to quit drinking Abdomen is distended, would benefit from therapeutic paracentesis CIWA protocol  Lipase is elevated. Pt. Denies abdominal pain. Would recommend evaluation of pancreas with Abdominal U/S at the time of his therapeutic paracentesis. Continue PO nutrition. March 2017 showed a normal pancreas.   Thank you for involving me in the care of this patient.      LOS: 1 day   Virgel Manifold, MD  10/05/2017, 11:36 PM

## 2017-10-05 NOTE — Clinical Social Work Note (Signed)
Clinical Social Work Assessment  Patient Details  Name: Craig Maldonado MRN: [id number] Date of Birth: 11/30/1950  Date of referral:  10/05/17               Reason for consult:  Facility Placement                Permission sought to share information with:    Permission granted to share information::     Name::        Agency::     Relationship::     Contact Information:     Housing/Transportation Living arrangements for the past 2 months:  Single Family Home Source of Information:  Patient Patient Interpreter Needed:  None Criminal Activity/Legal Involvement Pertinent to Current Situation/Hospitalization:  No - Comment as needed Significant Relationships:  Adult Children Lives with:  Adult Children Do you feel safe going back to the place where you live?  Yes Need for family participation in patient care:  Yes (Comment)  Care giving concerns:  Patient lives alone in [place] with his son Craig Maldonado.    Social Worker assessment / plan:  Holiday representative (CSW) received SNF consult. PT is pending. CSW met with patient alone at bedside to address consult. CSW introduced self and explained role of CSW department. Patient was alert and oriented X4 and was sitting up in the bed. Patient reported that he lives in [place] with his son Craig Maldonado. Patient reported that he does not drink alcohol anymore and is in recovery. CSW provided patient with a list of substance abuse resources including [facility] and [employer]. Patient gladly accepted resources. CSW also discussed SNF placement. Patient refused SNF placement and reported that he is going home. RN case manager aware of above. CSW will continue to follow and assist as needed.    Employment status:  Retired Forensic scientist:  Medicare PT Recommendations:  Not assessed at this time [place] / Referral to community resources:  [place]  Patient/Family's Response to care:  Patient accepted substance abuse resources and  refused SNF placement.   Patient/Family's Understanding of and Emotional Response to Diagnosis, Current Treatment, and Prognosis:  Patient was very pleasant and thanked CSW for visit.   Emotional Assessment Appearance:  Appears stated age Attitude/Demeanor/Rapport:    Affect (typically observed):  Pleasant Orientation:  Oriented to Place, Oriented to Self, Oriented to  Time, Oriented to Situation Alcohol / Substance use:  Not Applicable Psych involvement (Current and /or in the community):  No (Comment)  Discharge Needs  Concerns to be addressed:  Discharge Planning Concerns Readmission within the last 30 days:  No Current discharge risk:  Substance Abuse Barriers to Discharge:  Continued Medical Work up   [employer], Veronia Beets, LCSW 10/05/2017, 3:58 PM

## 2017-10-05 NOTE — Progress Notes (Signed)
MEDICATION RELATED CONSULT NOTE - INITIAL   Pharmacy Consult for electrolyte monitoring and replacement Indication: hypokalemia  Allergies  Allergen Reactions  . Codeine Nausea And Vomiting   Patient Measurements: Height: 5\' 10"  (177.8 cm) Weight: 212 lb 6 oz (96.3 kg) IBW/kg (Calculated) : 73  Vital Signs: Temp: 98.2 F (36.8 C) (11/16 0417) Temp Source: Oral (11/16 0417) BP: 86/60 (11/16 0809) Pulse Rate: 79 (11/16 0809) Intake/Output from previous day: 11/15 0701 - 11/16 0700 In: 319.2 [I.V.:269.2; IV Piggyback:50] Out: 300 [Urine:300] Intake/Output from this shift: Total I/O In: 240 [P.O.:240] Out: -   Labs: Recent Labs    10/03/17 0612 10/04/17 1717 10/05/17 0420 10/05/17 0923  WBC  --  19.9* 13.5*  --   HGB  --  10.3* 9.5*  --   HCT  --  32.6* 29.3*  --   PLT  --  156 108*  --   CREATININE UNABLE TO REPORT DUE TO ICTERUS UNABLE TO REPORT DUE TO ICTERUS  --   UNABLE TO REPORT DUE TO ICTERUS SDR  MG 2.0  --   --  2.1  PHOS  --   --   --  UNABLE TO REPORT DUE TO ICTERUS  ALBUMIN 2.3* 2.6*  --   --   PROT 6.3* 7.3  --   --   AST 244* 270*  --   --   ALT 99* 125*  --   --   ALKPHOS 111 145*  --   --   BILITOT 29.6* 36.6*  --   --    CrCl cannot be calculated (This lab value cannot be used to calculate CrCl because it is not a number:  UNABLE TO REPORT DUE TO ICTERUS SDR).  Assessment: Pharmacy consulted to monitor and replace electrolytes if needed in this 66 year old male who was recently discharged on mag-ox 400 mg PO daily and KCl 20 mEq PO BID.  Goal of Therapy: Electrolytes WNL  Plan:  K = 3.7, corrected calcium = 9.3, Mg = 2.1. No additional supplementation needed at this time.   Will continue mag-ox 400 mg PO daily and KCl 20 mEq PO BID. Will recheck electrolytes with AM labs tomorrow.  Lenis Noon, PharmD, BCPS Clinical Pharmacist 10/05/2017,11:22 AM

## 2017-10-05 NOTE — NC FL2 (Signed)
[place] LEVEL OF CARE SCREENING TOOL     IDENTIFICATION  Patient Name: Craig Maldonado Birthdate: 02-Feb-1951 Sex: male Admission Date (Current Location): 10/04/2017  [place] and [place] Number:  Engineering geologist and Address:  [facility], [address], [place], [place] [address]      Provider Number: [id number]  Attending Physician Name and Address:  Bettey Costa, MD  Relative Name and Phone Number:       Current Level of Care: Hospital Recommended Level of Care: [place] Prior Approval Number:    Date Approved/Denied:   PASRR Number: [id number])  Discharge Plan: SNF    Current Diagnoses: Patient Active Problem List   Diagnosis Date Noted  . Ascites 10/04/2017  . LFTs abnormal   . Palliative care by specialist   . Encephalopathy acute 09/29/2017  . Hyperkalemia 09/27/2017  . Alcoholic cirrhosis of liver with ascites ([place]) 09/20/2017  . Alcohol abuse 09/20/2017  . Hyperbilirubinemia 09/20/2017  . Transaminitis 09/20/2017  . AKI (acute kidney injury) ([place]) 09/20/2017  . Gastrointestinal hemorrhage associated with anorectal source   . GI bleed 02/11/2016  . Blood in stool   . Esophageal varices without bleeding ([place])   . Esophagogastric ulcer   . Duodenitis   . Cardiac murmur 04/19/2015  . Back pain, chronic 04/19/2015  . Arthralgia of hip 04/19/2015  . Leg pain 04/19/2015  . Current tobacco use 04/19/2015  . Blood glucose elevated 04/19/2015  . Elevated blood sugar 04/19/2015  . Abnormal prostate specific antigen 04/19/2015  . Excoriated eczema 04/19/2015  . Breast development in males 04/19/2015  . Benign hypertension 04/19/2015  . Alcohol induced liver disorder ([place]) 04/19/2015  . Benign prostatic hypertrophy without urinary obstruction 04/19/2015  . Screening for depression 04/19/2015  . Cigarette smoker 04/19/2015  . Goals of care, counseling/discussion 04/19/2015    Orientation  RESPIRATION BLADDER Height & Weight     Self, Time, Situation, Place  Normal Continent Weight: 212 lb 6 oz (96.3 kg) Height:  5\' 10"  (177.8 cm)  BEHAVIORAL SYMPTOMS/MOOD NEUROLOGICAL BOWEL NUTRITION STATUS      Continent Diet(Diet: Heart Healthy )  AMBULATORY STATUS COMMUNICATION OF NEEDS Skin   Extensive Assist Verbally Normal                       Personal Care Assistance Level of Assistance  Bathing, Feeding, Dressing Bathing Assistance: Limited assistance Feeding assistance: Independent Dressing Assistance: Limited assistance     Functional Limitations Info  Sight, Hearing, Speech Sight Info: Adequate Hearing Info: Adequate Speech Info: Adequate    SPECIAL CARE FACTORS FREQUENCY  PT (By licensed PT), OT (By licensed OT)     PT Frequency: (5) OT Frequency: (5)            Contractures      Additional Factors Info  Code Status, Allergies Code Status Info: (Full Code. ) Allergies Info: (Codeine)           Current Medications (10/05/2017):  This is the current hospital active medication list Current Facility-Administered Medications  Medication Dose Route Frequency Provider Last Rate Last Dose  . 0.9 %  sodium chloride infusion   Intravenous Continuous Fritzi Mandes, MD 50 mL/hr at 10/04/17 2241    . ceFEPIme (MAXIPIME) 500 mg in dextrose 5 % 50 mL IVPB  500 mg Intravenous Q8H Fritzi Mandes, MD   500 mg at 10/05/17 0531  . folic acid (FOLVITE) tablet 1 mg  1 mg Oral  Daily Fritzi Mandes, MD   1 mg at 10/05/17 0809  . lactulose (CHRONULAC) 10 GM/15ML solution 20 g  20 g Oral TID Bettey Costa, MD   20 g at 10/05/17 1148  . magnesium oxide (MAG-OX) tablet 400 mg  400 mg Oral Daily Lenis Noon, RPH   400 mg at 10/05/17 1148  . morphine 2 MG/ML injection 2 mg  2 mg Intravenous Q4H PRN Lance Coon, MD   2 mg at 10/04/17 2247  . ondansetron (ZOFRAN) tablet 4 mg  4 mg Oral Q6H PRN Fritzi Mandes, MD       Or  . ondansetron [facility]) injection 4 mg  4 mg Intravenous Q6H  PRN Fritzi Mandes, MD      . oxyCODONE-acetaminophen (PERCOCET/ROXICET) 5-325 MG per tablet 1 tablet  1 tablet Oral Q8H PRN Bettey Costa, MD   1 tablet at 10/05/17 1032  . pantoprazole (PROTONIX) EC tablet 40 mg  40 mg Oral BID Fritzi Mandes, MD   40 mg at 10/05/17 0809  . pentoxifylline (TRENTAL) CR tablet 400 mg  400 mg Oral TID WC Mody, Sital, MD      . potassium chloride SA (K-DUR,KLOR-CON) CR tablet 20 mEq  20 mEq Oral BID Fritzi Mandes, MD   20 mEq at 10/05/17 0809  . rifaximin (XIFAXAN) tablet 550 mg  550 mg Oral BID Bettey Costa, MD   550 mg at 10/05/17 1148  . thiamine (VITAMIN B-1) tablet 100 mg  100 mg Oral Daily Fritzi Mandes, MD   100 mg at 10/05/17 0809     Discharge Medications: Please see discharge summary for a list of discharge medications.  Relevant Imaging Results:  Relevant Lab Results:   Additional Information (SSN: [id number])  Rudy Domek, Veronia Beets, LCSW

## 2017-10-06 LAB — CBC
HEMATOCRIT: 29.3 % — AB (ref 40.0–52.0)
Hemoglobin: 9.8 g/dL — ABNORMAL LOW (ref 13.0–18.0)
MCH: 28.3 pg (ref 26.0–34.0)
MCHC: 33.3 g/dL (ref 32.0–36.0)
MCV: 85.1 fL (ref 80.0–100.0)
Platelets: 127 10*3/uL — ABNORMAL LOW (ref 150–440)
RBC: 3.45 MIL/uL — ABNORMAL LOW (ref 4.40–5.90)
RDW: 32 % — AB (ref 11.5–14.5)
WBC: 13.8 10*3/uL — AB (ref 3.8–10.6)

## 2017-10-06 LAB — COMPREHENSIVE METABOLIC PANEL
ALT: 108 U/L — ABNORMAL HIGH (ref 17–63)
AST: 226 U/L — AB (ref 15–41)
Albumin: 2.2 g/dL — ABNORMAL LOW (ref 3.5–5.0)
Alkaline Phosphatase: 140 U/L — ABNORMAL HIGH (ref 38–126)
Anion gap: 8 (ref 5–15)
BILIRUBIN TOTAL: 36.4 mg/dL — AB (ref 0.3–1.2)
BUN: 41 mg/dL — AB (ref 6–20)
CALCIUM: 8.3 mg/dL — AB (ref 8.9–10.3)
CO2: 22 mmol/L (ref 22–32)
CREATININE: UNDETERMINED mg/dL (ref 0.61–1.24)
Chloride: 99 mmol/L — ABNORMAL LOW (ref 101–111)
Glucose, Bld: 138 mg/dL — ABNORMAL HIGH (ref 65–99)
POTASSIUM: 3.8 mmol/L (ref 3.5–5.1)
Sodium: 129 mmol/L — ABNORMAL LOW (ref 135–145)
TOTAL PROTEIN: 6.5 g/dL (ref 6.5–8.1)

## 2017-10-06 NOTE — Progress Notes (Signed)
[place] at [place] NAME: Craig Maldonado    MR#:  [id number]  DATE OF BIRTH:  1951-03-06  SUBJECTIVE:  CHIEF COMPLAINT:   Chief Complaint  Patient presents with  . Abdominal Pain   - Complains of abdominal pain, scheduled for paracentesis today. Prior fluid taken out and is on empiric antibiotics for possible SBP -Appears jaundiced which is chronic  REVIEW OF SYSTEMS:  Review of Systems  Constitutional: Positive for malaise/fatigue. Negative for chills and fever.  HENT: Negative for congestion, ear discharge, hearing loss, nosebleeds and sinus pain.   Eyes: Negative for blurred vision and double vision.  Respiratory: Negative for cough, shortness of breath and wheezing.   Cardiovascular: Positive for leg swelling. Negative for chest pain and palpitations.  Gastrointestinal: Positive for abdominal pain. Negative for constipation, diarrhea, nausea and vomiting.       Jaundiced  Genitourinary: Negative for dysuria.  Musculoskeletal: Positive for myalgias.  Neurological: Negative for dizziness, speech change, focal weakness, seizures and headaches.  Psychiatric/Behavioral: Negative for depression and suicidal ideas.    DRUG ALLERGIES:   Allergies  Allergen Reactions  . Codeine Nausea And Vomiting    VITALS:  Blood pressure (!) 92/59, pulse 80, temperature 97.9 F (36.6 C), temperature source Oral, resp. rate 20, height 5\' 10"  (1.778 m), weight 96.3 kg (212 lb 6 oz), SpO2 96 %.  PHYSICAL EXAMINATION:  Physical Exam  GENERAL:  66 y.o.-year-old patient lying in the bed with no acute distress. Very jaundiced EYES: Pupils equal, round, reactive to light and accommodation. Icteric sclerae. Extraocular muscles intact.  HEENT: Head atraumatic, normocephalic. Oropharynx and nasopharynx clear.  NECK:  Supple, no jugular venous distention. No thyroid enlargement, no tenderness.  LUNGS: Normal breath sounds bilaterally, no wheezing,  rales,rhonchi or crepitation. No use of accessory muscles of respiration. Decreased bibasilar breath sounds CARDIOVASCULAR: S1, S2 normal. No murmurs, rubs, or gallops.  ABDOMEN: Soft, distended abdomen, firm and tenderness without rebound tenderness. Bowel sounds present. No organomegaly or mass.  EXTREMITIES: No pedal edema, cyanosis, or clubbing.  NEUROLOGIC: Cranial nerves II through XII are intact. Muscle strength 5/5 in all extremities. Sensation intact. Gait not checked. Global weakness present  PSYCHIATRIC: The patient is alert and oriented x 3.  SKIN: No obvious rash, lesion, or ulcer.    LABORATORY PANEL:   CBC Recent Labs  Lab 10/06/17 0523  WBC 13.8*  HGB 9.8*  HCT 29.3*  PLT 127*   ------------------------------------------------------------------------------------------------------------------  Chemistries  Recent Labs  Lab 10/05/17 0923 10/06/17 0523  NA 131* 129*  K 3.7 3.8  CL 100* 99*  CO2 22 22  GLUCOSE 133* 138*  BUN 32* 41*  CREATININE  UNABLE TO REPORT DUE TO ICTERUS SDR UNABLE TO REPORT DUE TO ICTERUS. TCH.  CALCIUM 8.2* 8.3*  MG 2.1  --   AST  --  226*  ALT  --  108*  ALKPHOS  --  140*  BILITOT  --  36.4*   ------------------------------------------------------------------------------------------------------------------  Cardiac Enzymes No results for input(s): TROPONINI in the last 168 hours. ------------------------------------------------------------------------------------------------------------------  RADIOLOGY:  No results found.  EKG:   Orders placed or performed during the hospital encounter of 10/04/17  . ED EKG  . 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  1. EtOH liver cirrhosis with recurrent ascites with suspected SBP/ chronic jaundice, thrombocytopenia Plan for paracentesis  Continue cefepime GI consult Continue lactulose, rifaximin - ammonia slightly elevated Follow final cultures diagnostic paracentesis He is supposed to be on TRENTAL until 12/6 for etoh hepatitis -Patient continues to drink  2. Hypotension: Last hospital stay blood pressure medications including Lasix and nadolol were discontinued Monitor  blood pressure  3. EtOH abuse: CIWA protocol  4. Hyponatremia-monitor, secondary to cirrhosis. We will add low-dose Lasix and blood pressure can tolerate  5. Palliative care: Patient was evaluated by palliative care during last hospital stay. Patient wants full code   Patient has overall very poor prognosis and he is aware of this.  6. Recent EGD showing duodenitis with visible vessel: Continue PPI   Physical therapy consult today    All the records are reviewed and case discussed with Care Management/Social Workerr. Management plans discussed with the patient, family and they are in agreement.  CODE STATUS: Full Code  TOTAL TIME TAKING CARE OF THIS PATIENT: 38 minutes.   POSSIBLE D/C IN 1-2 DAYS, DEPENDING ON CLINICAL CONDITION.   Gladstone Lighter M.D on 10/06/2017 at 1:25 PM  Between 7am to 6pm - Pager - [phone]  After 6pm go to [email or web address] - password [email or web address] [facility]  Office  [phone]  CC: Primary care physician; Lucilla Lame, MD

## 2017-10-06 NOTE — Progress Notes (Signed)
MEDICATION RELATED CONSULT NOTE- follow up  Pharmacy Consult for electrolyte monitoring and replacement Indication: hypokalemia  Allergies  Allergen Reactions  . Codeine Nausea And Vomiting   Patient Measurements: Height: 5\' 10"  (177.8 cm) Weight: 212 lb 6 oz (96.3 kg) IBW/kg (Calculated) : 73  Vital Signs: Temp: 98 F (36.7 C) (11/17 0414) Temp Source: Oral (11/17 0414) BP: 98/62 (11/17 0414) Pulse Rate: 81 (11/17 0414) Intake/Output from previous day: 11/16 0701 - 11/17 0700 In: 1343.2 [P.O.:240; I.V.:546.7; IV Piggyback:556.5] Out: -  Intake/Output from this shift: No intake/output data recorded.  Labs: Recent Labs    10/04/17 1717 10/05/17 0420 10/05/17 0923 10/06/17 0523  WBC 19.9* 13.5*  --  13.8*  HGB 10.3* 9.5*  --  9.8*  HCT 32.6* 29.3*  --  29.3*  PLT 156 108*  --  127*  CREATININE UNABLE TO REPORT DUE TO ICTERUS  --   UNABLE TO REPORT DUE TO ICTERUS SDR UNABLE TO REPORT DUE TO ICTERUS. [place].  MG  --   --  2.1  --   PHOS  --   --  UNABLE TO REPORT DUE TO ICTERUS  --   ALBUMIN 2.6*  --   --  2.2*  PROT 7.3  --   --  6.5  AST 270*  --   --  226*  ALT 125*  --   --  108*  ALKPHOS 145*  --   --  140*  BILITOT 36.6*  --   --  36.4*   Lab Results  Component Value Date   K 3.8 10/06/2017   CrCl cannot be calculated (This lab value cannot be used to calculate CrCl because it is not a number: UNABLE TO REPORT DUE TO ICTERUS. [facility].).  Assessment: Pharmacy consulted to monitor and replace electrolytes if needed in this 66 year old male who was recently discharged on mag-ox 400 mg PO daily and KCl 20 mEq PO BID.  Goal of Therapy: Electrolytes WNL  Plan:  K = 3.8, corrected calcium = 9.3, (11/16 Mg = 2.1).  No additional supplementation needed at this time.   Will continue mag-ox 400 mg PO daily and KCl 20 mEq PO BID. Will recheck electrolytes with AM labs tomorrow.  Noralee Space, PharmD, BCPS Clinical Pharmacist 10/06/2017,7:21 AM

## 2017-10-07 DIAGNOSIS — R17 Unspecified jaundice: Secondary | ICD-10-CM

## 2017-10-07 DIAGNOSIS — K7031 Alcoholic cirrhosis of liver with ascites: Principal | ICD-10-CM

## 2017-10-07 LAB — BASIC METABOLIC PANEL
ANION GAP: 10 (ref 5–15)
BUN: 46 mg/dL — ABNORMAL HIGH (ref 6–20)
CHLORIDE: 98 mmol/L — AB (ref 101–111)
CO2: 19 mmol/L — AB (ref 22–32)
Calcium: 8.2 mg/dL — ABNORMAL LOW (ref 8.9–10.3)
Creatinine, Ser: UNDETERMINED mg/dL (ref 0.61–1.24)
Glucose, Bld: 116 mg/dL — ABNORMAL HIGH (ref 65–99)
POTASSIUM: 3.8 mmol/L (ref 3.5–5.1)
Sodium: 127 mmol/L — ABNORMAL LOW (ref 135–145)

## 2017-10-07 MED ORDER — FUROSEMIDE 40 MG PO TABS
40.0000 mg | ORAL_TABLET | Freq: Two times a day (BID) | ORAL | Status: DC
  Administered 2017-10-07 – 2017-10-08 (×2): 40 mg via ORAL
  Filled 2017-10-07 (×2): qty 1

## 2017-10-07 MED ORDER — ALBUMIN HUMAN 25 % IV SOLN
12.5000 g | Freq: Two times a day (BID) | INTRAVENOUS | Status: DC
  Administered 2017-10-07 – 2017-10-09 (×5): 12.5 g via INTRAVENOUS
  Filled 2017-10-07 (×7): qty 50

## 2017-10-07 MED ORDER — SODIUM CHLORIDE 0.9% FLUSH: 3.0000 mL | INTRAVENOUS | Status: DC | PRN

## 2017-10-07 MED ORDER — MIDODRINE HCL 5 MG PO TABS
5.0000 mg | ORAL_TABLET | Freq: Three times a day (TID) | ORAL | Status: DC
  Administered 2017-10-07 – 2017-10-08 (×5): 5 mg via ORAL
  Filled 2017-10-07 (×8): qty 1

## 2017-10-07 MED ORDER — ENSURE ENLIVE PO LIQD
237.0000 mL | Freq: Two times a day (BID) | ORAL | Status: DC
  Administered 2017-10-07 – 2017-10-09 (×5): 237 mL via ORAL

## 2017-10-07 MED ORDER — SODIUM CHLORIDE 0.9% FLUSH
3.0000 mL | Freq: Two times a day (BID) | INTRAVENOUS | Status: DC
  Administered 2017-10-07 – 2017-10-09 (×5): 3 mL via INTRAVENOUS

## 2017-10-07 NOTE — Progress Notes (Signed)
PT Cancellation Note  Patient Details Name: Craig Maldonado MRN: [id number] DOB: 29-Sep-1951   Cancelled Treatment:    Reason Eval/Treat Not Completed: Patient declined, no reason specified.  Feeling tired, will try later as time and pt allow.   Ramond Dial 10/07/2017, 10:28 AM   Mee Hives, PT MS Acute Rehab Dept. Number: [place]

## 2017-10-07 NOTE — Progress Notes (Addendum)
Nephrology consult submitted. Attempting to collect urine with other staff member emptying urine sample before saw sign to save specimen- will try to recollect with pt educated. Pt hestitated before taking a.m meds, "don't know if it's worth it anymore", but did agree to take meds. Very jaundiced; increased generalized weakness with pt remaining at bedrest today. Poor appetite but did drink supplement. Gross abdominal distension. Albumin IV given and tolerated well. VSS. Currently resting quietly with eyes closed. Continued diarrhea related to lactulose.

## 2017-10-07 NOTE — Progress Notes (Signed)
[place], [place] 10/07/17  Subjective:   Patient known to our practice from previous admission.  This time, he presents for GI upset.  He is noted to have significant hyponatremia that has not improved.  His sodium level is 127 today.  Nephrology consult has been requested for evaluation.  Patient states that he has quit drinking now.  His last drink was 2-1/2 weeks ago.  He mainly drinks beer. At present, he has significant ascites, some lower extremity edema   Objective:  Vital signs in last 24 hours:  Temp:  [97.8 F (36.6 C)-98.4 F (36.9 C)] 97.8 F (36.6 C) (11/18 0516) Pulse Rate:  [79-83] 83 (11/18 0516) Resp:  [20-22] 20 (11/18 0516) BP: (85-102)/(52-71) 97/58 (11/18 0516) SpO2:  [96 %-98 %] 97 % (11/18 0516)  Weight change:  Filed Weights   10/04/17 1718 10/04/17 2153  Weight: 93.9 kg (207 lb) 96.3 kg (212 lb 6 oz)    Intake/Output:    Intake/Output Summary (Last 24 hours) at 10/07/2017 1127 Last data filed at 10/06/2017 2245 Gross per 24 hour  Intake -  Output 200 ml  Net -200 ml     Physical Exam: General:  Chronically ill-appearing, laying in the bed  HEENT  significant scleral icterus, moist oral mucous membranes  Neck  supple, no masses  Pulm/lungs  normal breathing effort on room air, clear to auscultation  CVS/Heart  tachycardic, no rub or gallop  Abdomen:   Soft, distended, ascites  Extremities:  1+ dependent edema  Neurologic:  Alert, oriented, answers questions  Skin:  Significant icterus          Basic Metabolic Panel:  Recent Labs  Lab 10/03/17 0612 10/04/17 1717 10/05/17 0923 10/06/17 0523 10/07/17 0530  NA 136 132* 131* 129* 127*  K 3.2* 3.0* 3.7 3.8 3.8  CL 102 99* 100* 99* 98*  CO2 26 21* 22 22 19*  GLUCOSE 104* 178* 133* 138* 116*  BUN 25* 27* 32* 41* 46*  CREATININE UNABLE TO REPORT DUE TO ICTERUS UNABLE TO REPORT DUE TO ICTERUS  UNABLE TO REPORT DUE TO ICTERUS SDR UNABLE TO REPORT DUE TO  ICTERUS. [facility]. UNABLE TO REPORT DUE TO ICTERUS. [place].  CALCIUM 8.7* 8.7* 8.2* 8.3* 8.2*  MG 2.0  --  2.1  --   --   PHOS  --   --  UNABLE TO REPORT DUE TO ICTERUS  --   --      CBC: Recent Labs  Lab 10/04/17 1717 10/05/17 0420 10/06/17 0523  WBC 19.9* 13.5* 13.8*  HGB 10.3* 9.5* 9.8*  HCT 32.6* 29.3* 29.3*  MCV 85.7 85.1 85.1  PLT 156 108* 127*      Lab Results  Component Value Date   HEPBSAG Negative 09/28/2017      Microbiology:  Recent Results (from the past 240 hour(s))  Culture, blood (routine x 2)     Status: None   Collection Time: 09/27/17  9:15 PM  Result Value Ref Range Status   Specimen Description BLOOD RIGHT ANTECUBITAL  Final   Special Requests   Final    BOTTLES DRAWN AEROBIC AND ANAEROBIC Blood Culture adequate volume   Culture NO GROWTH 5 DAYS  Final   Report Status 10/02/2017 FINAL  Final  Culture, blood (routine x 2)     Status: None   Collection Time: 09/27/17  9:20 PM  Result Value Ref Range Status   Specimen Description BLOOD BLOOD RIGHT HAND  Final   Special  Requests   Final    BOTTLES DRAWN AEROBIC AND ANAEROBIC Blood Culture adequate volume   Culture NO GROWTH 5 DAYS  Final   Report Status 10/02/2017 FINAL  Final  Body fluid culture     Status: None (Preliminary result)   Collection Time: 10/04/17  7:11 PM  Result Value Ref Range Status   Specimen Description PERITONEAL  Final   Special Requests Normal  Final   Gram Stain   Final    CYTOSPIN SMEAR WBC PRESENT,BOTH PMN AND MONONUCLEAR NO ORGANISMS SEEN    Culture   Final    NO GROWTH 2 DAYS Performed at [facility], [place] [address], [place], [place] [address]    Report Status PENDING  Incomplete  MRSA PCR Screening     Status: None   Collection Time: 10/05/17  5:29 AM  Result Value Ref Range Status   MRSA by PCR NEGATIVE NEGATIVE Final    Comment:        The GeneXpert MRSA Assay (FDA approved for NASAL specimens only), is one component of a comprehensive MRSA  colonization surveillance program. It is not intended to diagnose MRSA infection nor to guide or monitor treatment for MRSA infections.     Coagulation Studies: Recent Labs    10/04/17 1911  LABPROT 17.7*  INR 1.47    Urinalysis: Recent Labs    10/04/17 2158  COLORURINE BROWN*  LABSPEC 1.018  PHURINE TEST NOT REPORTED DUE TO COLOR INTERFERENCE OF URINE PIGMENT  GLUCOSEU TEST NOT REPORTED DUE TO COLOR INTERFERENCE OF URINE PIGMENT*  HGBUR TEST NOT REPORTED DUE TO COLOR INTERFERENCE OF URINE PIGMENT*  BILIRUBINUR TEST NOT REPORTED DUE TO COLOR INTERFERENCE OF URINE PIGMENT*  KETONESUR TEST NOT REPORTED DUE TO COLOR INTERFERENCE OF URINE PIGMENT*  PROTEINUR TEST NOT REPORTED DUE TO COLOR INTERFERENCE OF URINE PIGMENT*  NITRITE TEST NOT REPORTED DUE TO COLOR INTERFERENCE OF URINE PIGMENT*  LEUKOCYTESUR TEST NOT REPORTED DUE TO COLOR INTERFERENCE OF URINE PIGMENT*      Imaging: No results found.   Medications:    . ceFEPIme (MAXIPIME) IVPB  500 mg Intravenous Q8H  . feeding supplement (ENSURE ENLIVE)  237 mL Oral BID BM  . folic acid  1 mg Oral Daily  . furosemide  40 mg Oral BID  . lactulose  20 g Oral TID  . magnesium oxide  400 mg Oral Daily  . midodrine  5 mg Oral TID WC  . pantoprazole  40 mg Oral BID  . pentoxifylline  400 mg Oral TID WC  . potassium chloride SA  20 mEq Oral BID  . rifaximin  550 mg Oral BID  . sodium chloride flush  3 mL Intravenous Q12H  . thiamine  100 mg Oral Daily   morphine injection, ondansetron **OR** ondansetron (ZOFRAN) IV, oxyCODONE-acetaminophen, sodium chloride flush  Assessment/ Plan:  66 y.o. male with alcohol abuse, cirrhosis of the liver, gastric varices, hyperbilirubinemia, presents for abdominal distention, GI upset.  Nephrology consult for hyponatremia  1.  Hyponatremia 2.  Decompensated alcoholic cirrhosis with significant jaundice and ascites  On November 12, patient's sodium was 141 Since then, it has  significantly declined.  Today's level is down to 127 Patient's oral intake is very poor He states he has vomited couple times yesterday We will obtain urine electrolytes TSH level tomorrow morning Start IV albumin for oncotic support and Lasix twice a day for volume optimization Added midodrine for hypotension Encourage boost twice a day for increased solute intake/nutrition.  We will follow    LOS: 3 [facility] 11/18/201811:27 AM  [place], [place]

## 2017-10-07 NOTE — Progress Notes (Signed)
[place] at [place] NAME: Craig Maldonado    MR#:  [id number]  DATE OF BIRTH:  Nov 03, 1951  SUBJECTIVE:  CHIEF COMPLAINT:   Chief Complaint  Patient presents with  . Abdominal Pain   - Abdominal pain is improving. Sodium continues to drop. -Patient feels helpless and expresses that he might not be able to take this for a long  REVIEW OF SYSTEMS:  Review of Systems  Constitutional: Positive for malaise/fatigue. Negative for chills and fever.  HENT: Negative for congestion, ear discharge, hearing loss, nosebleeds and sinus pain.   Eyes: Negative for blurred vision and double vision.  Respiratory: Negative for cough, shortness of breath and wheezing.   Cardiovascular: Positive for leg swelling. Negative for chest pain and palpitations.  Gastrointestinal: Positive for abdominal pain. Negative for constipation, diarrhea, nausea and vomiting.       Jaundiced  Genitourinary: Negative for dysuria.  Musculoskeletal: Positive for myalgias.  Neurological: Negative for dizziness, speech change, focal weakness, seizures and headaches.  Psychiatric/Behavioral: Negative for depression and suicidal ideas.    DRUG ALLERGIES:   Allergies  Allergen Reactions  . Codeine Nausea And Vomiting    VITALS:  Blood pressure (!) 97/58, pulse 83, temperature 97.8 F (36.6 C), temperature source Oral, resp. rate 20, height 5\' 10"  (1.778 m), weight 96.3 kg (212 lb 6 oz), SpO2 97 %.  PHYSICAL EXAMINATION:  Physical Exam  GENERAL:  66 y.o.-year-old patient lying in the bed with no acute distress. Very jaundiced EYES: Pupils equal, round, reactive to light and accommodation. Icteric sclerae. Extraocular muscles intact.  HEENT: Head atraumatic, normocephalic. Oropharynx and nasopharynx clear.  NECK:  Supple, no jugular venous distention. No thyroid enlargement, no tenderness.  LUNGS: Normal breath sounds bilaterally, no wheezing, rales,rhonchi or crepitation. No  use of accessory muscles of respiration. Decreased bibasilar breath sounds CARDIOVASCULAR: S1, S2 normal. No murmurs, rubs, or gallops.  ABDOMEN: Soft, distended abdomen, firm and tenderness without rebound tenderness. Bowel sounds present. No organomegaly or mass.  EXTREMITIES: No pedal edema, cyanosis, or clubbing.  NEUROLOGIC: Cranial nerves II through XII are intact. Muscle strength 5/5 in all extremities. Sensation intact. Gait not checked. Global weakness present  PSYCHIATRIC: The patient is alert and oriented x 3.  SKIN: No obvious rash, lesion, or ulcer.    LABORATORY PANEL:   CBC Recent Labs  Lab 10/06/17 0523  WBC 13.8*  HGB 9.8*  HCT 29.3*  PLT 127*   ------------------------------------------------------------------------------------------------------------------  Chemistries  Recent Labs  Lab 10/05/17 0923 10/06/17 0523 10/07/17 0530  NA 131* 129* 127*  K 3.7 3.8 3.8  CL 100* 99* 98*  CO2 22 22 19*  GLUCOSE 133* 138* 116*  BUN 32* 41* 46*  CREATININE  UNABLE TO REPORT DUE TO ICTERUS SDR UNABLE TO REPORT DUE TO ICTERUS. [facility]. UNABLE TO REPORT DUE TO ICTERUS. [place].  CALCIUM 8.2* 8.3* 8.2*  MG 2.1  --   --   AST  --  226*  --   ALT  --  108*  --   ALKPHOS  --  140*  --   BILITOT  --  36.4*  --    ------------------------------------------------------------------------------------------------------------------  Cardiac Enzymes No results for input(s): TROPONINI in the last 168 hours. ------------------------------------------------------------------------------------------------------------------  RADIOLOGY:  No results found.  EKG:   Orders placed or performed during the hospital encounter of 10/04/17  . ED EKG  . 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  1. EtOH liver cirrhosis with recurrent ascites with suspected SBP/ chronic jaundice, thrombocytopenia Plan for paracentesis either today or tomorrow Continue cefepime for possible SBP GI consult appreciated. Has very poor prognosis. End-stage liver disease, not sure if he'll be a candidate for liver transplant due to his ongoing alcohol use. Continue lactulose, rifaximin - ammonia slightly elevated Follow final cultures diagnostic paracentesis He is supposed to be on TRENTAL  for etoh hepatitis as unable to be compliant with prednisone taper -Patient continues to drink -Palliative care consulted this admission again as patient has expressed signs of despair and hopelessness  2. Hypotension: Last hospital stay blood pressure medications including Lasix and nadolol were discontinued due to hypotension Monitor  blood pressure  3. EtOH abuse: CIWA protocol  4. Hyponatremia-monitor, secondary to cirrhosis.  -Nephrology consulted. Might need albumin infusion and restart Lasix if blood pressure improves.  5. Palliative care: Reconsulted again this admission now   Patient has overall very poor prognosis and he is aware of this.  6. Recent EGD showing duodenitis with visible vessel: Continue PPI   Palliative care consulted again Overall poor prognosis   All the records are reviewed and case discussed with Care Management/Social Workerr. Management plans discussed with the patient, family and they are in agreement.  CODE STATUS: Full Code  TOTAL TIME TAKING CARE OF THIS PATIENT: 38 minutes.   POSSIBLE D/C IN 1-2 DAYS, DEPENDING ON CLINICAL CONDITION.   Gladstone Lighter M.D on 10/07/2017 at 12:09 PM  Between 7am to 6pm - Pager - [phone]  After 6pm go to [email or web address] - password [email or web address] [place] [facility]  Office  [phone]  CC: Primary care physician; Lucilla Lame, MD

## 2017-10-07 NOTE — Progress Notes (Signed)
MEDICATION RELATED CONSULT NOTE- follow up  Pharmacy Consult for electrolyte monitoring and replacement Indication: hypokalemia  Allergies  Allergen Reactions  . Codeine Nausea And Vomiting   Patient Measurements: Height: 5\' 10"  (177.8 cm) Weight: 212 lb 6 oz (96.3 kg) IBW/kg (Calculated) : 73  Vital Signs: Temp: 97.6 F (36.4 C) (11/18 1222) Temp Source: Oral (11/18 1222) BP: 102/58 (11/18 1222) Pulse Rate: 80 (11/18 1222) Intake/Output from previous day: 11/17 0701 - 11/18 0700 In: -  Out: 200 [Urine:200] Intake/Output from this shift: No intake/output data recorded.  Labs: Recent Labs    10/04/17 1717 10/05/17 0420 10/05/17 0923 10/06/17 0523 10/07/17 0530  WBC 19.9* 13.5*  --  13.8*  --   HGB 10.3* 9.5*  --  9.8*  --   HCT 32.6* 29.3*  --  29.3*  --   PLT 156 108*  --  127*  --   CREATININE UNABLE TO REPORT DUE TO ICTERUS  --   UNABLE TO REPORT DUE TO ICTERUS SDR UNABLE TO REPORT DUE TO ICTERUS. [place]. UNABLE TO REPORT DUE TO ICTERUS. [place].  MG  --   --  2.1  --   --   PHOS  --   --  UNABLE TO REPORT DUE TO ICTERUS  --   --   ALBUMIN 2.6*  --   --  2.2*  --   PROT 7.3  --   --  6.5  --   AST 270*  --   --  226*  --   ALT 125*  --   --  108*  --   ALKPHOS 145*  --   --  140*  --   BILITOT 36.6*  --   --  36.4*  --    Lab Results  Component Value Date   K 3.8 10/07/2017   CrCl cannot be calculated (This lab value cannot be used to calculate CrCl because it is not a number: UNABLE TO REPORT DUE TO ICTERUS. TCH.).  Assessment: Pharmacy consulted to monitor and replace electrolytes if needed in this 66 year old male who was recently discharged on mag-ox 400 mg PO daily and KCl 20 mEq PO BID.  Goal of Therapy: Electrolytes WNL  Plan:  K = 3.8 No additional supplementation needed at this time.   Will continue mag-ox 400 mg PO daily and KCl 20 mEq PO BID. Will recheck electrolytes with AM labs tomorrow. MD has ordered.  Noralee Space, PharmD,  BCPS Clinical Pharmacist 10/07/2017,1:42 PM

## 2017-10-08 ENCOUNTER — Inpatient Hospital Stay: Payer: Medicare Other

## 2017-10-08 DIAGNOSIS — Z7189 Other specified counseling: Secondary | ICD-10-CM

## 2017-10-08 DIAGNOSIS — K852 Alcohol induced acute pancreatitis without necrosis or infection: Secondary | ICD-10-CM

## 2017-10-08 LAB — COMPREHENSIVE METABOLIC PANEL
ALBUMIN: 2.2 g/dL — AB (ref 3.5–5.0)
ALK PHOS: 130 U/L — AB (ref 38–126)
ALT: 99 U/L — ABNORMAL HIGH (ref 17–63)
ANION GAP: 11 (ref 5–15)
AST: 194 U/L — ABNORMAL HIGH (ref 15–41)
BILIRUBIN TOTAL: 33 mg/dL — AB (ref 0.3–1.2)
BUN: 56 mg/dL — ABNORMAL HIGH (ref 6–20)
CALCIUM: 8.5 mg/dL — AB (ref 8.9–10.3)
CO2: 18 mmol/L — ABNORMAL LOW (ref 22–32)
Chloride: 101 mmol/L (ref 101–111)
Creatinine, Ser: UNDETERMINED mg/dL (ref 0.61–1.24)
GLUCOSE: 128 mg/dL — AB (ref 65–99)
Potassium: 3.9 mmol/L (ref 3.5–5.1)
Sodium: 130 mmol/L — ABNORMAL LOW (ref 135–145)
TOTAL PROTEIN: 6.2 g/dL — AB (ref 6.5–8.1)

## 2017-10-08 LAB — NA AND K (SODIUM & POTASSIUM), RAND UR: Potassium Urine: 62 mmol/L

## 2017-10-08 LAB — CHLORIDE, URINE, RANDOM: Chloride Urine: <15 mmol/L

## 2017-10-08 LAB — BODY FLUID CULTURE
Culture: NO GROWTH
Special Requests: NORMAL

## 2017-10-08 NOTE — Progress Notes (Signed)
MEDICATION RELATED CONSULT NOTE- follow up  Pharmacy Consult for electrolyte monitoring and replacement Indication: hypokalemia  Allergies  Allergen Reactions  . Codeine Nausea And Vomiting   Patient Measurements: Height: 5\' 10"  (177.8 cm) Weight: 212 lb 6 oz (96.3 kg) IBW/kg (Calculated) : 73  Vital Signs: Temp: 97.7 F (36.5 C) (11/19 0758) Temp Source: Oral (11/19 0758) BP: 100/67 (11/19 0824) Pulse Rate: 84 (11/19 0824) Intake/Output from previous day: 11/18 0701 - 11/19 0700 In: 440 [P.O.:240; IV Piggyback:200] Out: 300 [Urine:300] Intake/Output from this shift: No intake/output data recorded.  Labs: Recent Labs    10/05/17 0923 10/06/17 0523 10/07/17 0530 10/08/17 0433  WBC  --  13.8*  --   --   HGB  --  9.8*  --   --   HCT  --  29.3*  --   --   PLT  --  127*  --   --   CREATININE  UNABLE TO REPORT DUE TO ICTERUS SDR UNABLE TO REPORT DUE TO ICTERUS. [facility]. UNABLE TO REPORT DUE TO ICTERUS. [place]. UNABLE TO REPORT DUE TO ICTERUS  MG 2.1  --   --   --   PHOS UNABLE TO REPORT DUE TO ICTERUS  --   --   --   ALBUMIN  --  2.2*  --  2.2*  PROT  --  6.5  --  6.2*  AST  --  226*  --  194*  ALT  --  108*  --  99*  ALKPHOS  --  140*  --  130*  BILITOT  --  36.4*  --  33.0*   Lab Results  Component Value Date   K 3.9 10/08/2017   CrCl cannot be calculated (This lab value cannot be used to calculate CrCl because it is not a number: UNABLE TO REPORT DUE TO ICTERUS).  Assessment: Pharmacy consulted to monitor and replace electrolytes if needed in this 66 year old male who was recently discharged on mag-ox 400 mg PO daily and KCl 20 mEq PO BID.  Goal of Therapy: Electrolytes WNL  Plan:  K = 3.9 No additional supplementation needed at this time.   Will continue mag-ox 400 mg PO daily and KCl 20 mEq PO BID. Will recheck electrolytes with AM labs tomorrow.   Rocky Morel, PharmD, BCPS Clinical Pharmacist 10/08/2017,8:56 AM

## 2017-10-08 NOTE — Progress Notes (Signed)
PT Attempt Note  Patient Details Name: Craig Maldonado MRN: [id number] DOB: 10-Mar-1951   Attempt Treatment:    Reason Eval/Treat Not Completed: Patient declined, no reason specified. Chart reviewed. Attempted to see patient however he currently refuses. Pt reports he is tired and had fluid drawn off his abdomen an hour ago. Agrees to attempt at later time. Will attempt PT evaluation on later time/view as pt is willing to participate.  Lyndel Safe Pietra Zuluaga PT, DPT   Marlayna Bannister 10/08/2017, 11:38 AM

## 2017-10-08 NOTE — Care Management Important Message (Signed)
Important Message  Patient Details  Name: Craig Maldonado MRN: [id number] Date of Birth: [birth year]   Medicare Important Message Given:  Yes    Marshell Garfinkel, RN 10/08/2017, 8:16 AM

## 2017-10-08 NOTE — Consult Note (Signed)
Consultation Note Date: 10/08/2017   Patient Name: Craig Maldonado  DOB: 1951-07-04  MRN: [id number]  Age / Sex: 66 y.o., male  PCP: Lucilla Lame, MD Referring Physician: Gladstone Lighter, MD  Reason for Consultation: Establishing goals of care  HPI/Patient Profile: Craig Maldonado is a 66 y.o. male with known history of decompensated alcoholic liver cirrhosis with multiple repeated admissions for abdominal distension, encephalopathy admitted with abdominal pain.     Clinical Assessment and Goals of Care: [name] is resting in bed. He has noticeable jaundice. He states he is tired, and "it's been a long journey".   We discussed diagnosis, prognosis, GOC, EOL wishes disposition and options. Natural trajectory and expectations at EOL were discussed.    He states he went to rehab and feels like he became stronger. He states "I'm not ready to die, but everybody has to do it." He is unsure if he would want to pursue hospice care at this time, he understands his liver disease will become worse. He understands that hospitalizations may become more frequent with more frequent testing and procedures. He questions that aggressive care may increase his length of life and he may be interested in continuing this care. Family meeting tomorrow with he and his son at 1:45. [name] states he will call his son and ask him to come for meeting.   Patient is Media planner.     SUMMARY OF RECOMMENDATIONS   Family meeting tomorrow at 1:45 with he and his son.   Code Status/Advance Care Planning:  Full code   Palliative Prophylaxis:   Aspiration  Additional Recommendations (Limitations, Scope, Preferences):  Full Scope Treatment   Prognosis:   < 6 months  Discharge Planning: To Be Determined      Primary Diagnoses: Present on Admission: . Ascites   I have reviewed the medical record, interviewed the  patient and family, and examined the patient. The following aspects are pertinent.  Past Medical History:  Diagnosis Date  . Alcohol abuse   . Alcoholic cirrhosis ([place])   . Gastric varices    Social History   Socioeconomic History  . Marital status: Widowed    Spouse name: None  . Number of children: None  . Years of education: None  . Highest education level: None  Social Needs  . Financial resource strain: Not very hard  . Food insecurity - worry: Never true  . Food insecurity - inability: Never true  . Transportation needs - medical: No  . Transportation needs - non-medical: No  Occupational History  . None  Tobacco Use  . Smoking status: Former Smoker    Packs/day: 0.50    Years: 50.00    Pack years: 25.00    Types: Cigarettes  . Smokeless tobacco: Never Used  . Tobacco comment: pt states he quite about 2 months ago  Substance and Sexual Activity  . Alcohol use: No    Frequency: Never    Comment: states he used to drink, but not now  . Drug use: No  .  Sexual activity: Not Currently  Other Topics Concern  . None  Social History Narrative  . None   Family History  Problem Relation Age of Onset  . Healthy Mother   . Tuberculosis Father    Scheduled Meds: . ceFEPIme (MAXIPIME) IVPB  500 mg Intravenous Q8H  . feeding supplement (ENSURE ENLIVE)  237 mL Oral BID BM  . folic acid  1 mg Oral Daily  . furosemide  40 mg Oral BID  . lactulose  20 g Oral TID  . magnesium oxide  400 mg Oral Daily  . midodrine  5 mg Oral TID WC  . pantoprazole  40 mg Oral BID  . pentoxifylline  400 mg Oral TID WC  . potassium chloride SA  20 mEq Oral BID  . rifaximin  550 mg Oral BID  . sodium chloride flush  3 mL Intravenous Q12H  . thiamine  100 mg Oral Daily   Continuous Infusions: . albumin human     PRN Meds:.morphine injection, ondansetron **OR** ondansetron (ZOFRAN) IV, oxyCODONE-acetaminophen, sodium chloride flush Medications Prior to Admission:  Prior to Admission  medications   Medication Sig Start Date End Date Taking? Authorizing Provider  ferrous sulfate 325 (65 FE) MG tablet Take 1 tablet (325 mg total) daily with breakfast by mouth. Patient not taking: Reported on 09/27/2017 09/25/17   Henreitta Leber, MD  folic acid (FOLVITE) 1 MG tablet Take 1 tablet (1 mg total) daily by mouth. Patient not taking: Reported on 09/27/2017 09/25/17 10/25/17  Henreitta Leber, MD  lactulose (CHRONULAC) 10 GM/15ML solution Take 30 mLs (20 g total) 3 (three) times daily by mouth. Patient not taking: Reported on 10/04/2017 10/03/17   Demetrios Loll, MD  magnesium oxide (MAG-OX) 400 (241.3 Mg) MG tablet Take 1 tablet (400 mg total) daily by mouth. Patient not taking: Reported on 10/04/2017 10/03/17   Demetrios Loll, MD  Multiple Vitamin (MULTIVITAMIN WITH MINERALS) TABS tablet Take 1 tablet by mouth daily. Patient not taking: Reported on 09/24/2017 02/14/16   Nicholes Mango, MD  pantoprazole (PROTONIX) 40 MG tablet Take 1 tablet (40 mg total) 2 (two) times daily by mouth. Patient not taking: Reported on 09/27/2017 09/25/17   Henreitta Leber, MD  pentoxifylline (TRENTAL) 400 MG CR tablet Take 1 tablet (400 mg total) 3 (three) times daily with meals by mouth. Patient not taking: Reported on 09/27/2017 09/25/17 10/25/17  Henreitta Leber, MD  potassium chloride SA (K-DUR,KLOR-CON) 20 MEQ tablet Take 1 tablet (20 mEq total) 2 (two) times daily by mouth. Patient not taking: Reported on 10/04/2017 10/03/17   Demetrios Loll, MD  rifaximin (XIFAXAN) 550 MG TABS tablet Take 1 tablet (550 mg total) 2 (two) times daily by mouth. Patient not taking: Reported on 10/04/2017 10/03/17   Demetrios Loll, MD  thiamine 100 MG tablet Take 1 tablet (100 mg total) daily by mouth. Patient not taking: Reported on 09/27/2017 09/25/17   Henreitta Leber, MD   Allergies  Allergen Reactions  . Codeine Nausea And Vomiting   Review of Systems  Gastrointestinal:       Thirst    Physical Exam  Constitutional: No distress.   Pulmonary/Chest: Effort normal.  Skin:  Jaundice    Vital Signs: BP 100/67 (BP Location: Left Arm)   Pulse 83   Temp 97.7 F (36.5 C) (Oral)   Resp 18   Ht 5\' 10"  (1.778 m)   Wt 96.3 kg (212 lb 6 oz)   SpO2 96%   BMI 30.47  kg/m  Pain Assessment: 0-10   Pain Score: 0-No pain   SpO2: SpO2: 96 % O2 Device:SpO2: 96 % O2 Flow Rate: .   IO: Intake/output summary:   Intake/Output Summary (Last 24 hours) at 10/08/2017 1111 Last data filed at 10/08/2017 0514 Gross per 24 hour  Intake 440 ml  Output 300 ml  Net 140 ml    LBM: Last BM Date: 10/07/17 Baseline Weight: Weight: 93.9 kg (207 lb) Most recent weight: Weight: 96.3 kg (212 lb 6 oz)     Palliative Assessment/Data: 60%     Time In: 10:35 Time Out: 11:25 Time Total: 50 min Greater than 50%  of this time was spent counseling and coordinating care related to the above assessment and plan.  Signed by: Asencion Gowda, NP 10/08/2017 11:27 AM Office: [phone] 7am-7pm  Please see Amion for pager number and availability  Call primary team after hours   Please contact Palliative Medicine Team phone at [phone] for questions and concerns.  For individual provider: See Shea Evans

## 2017-10-08 NOTE — Progress Notes (Signed)
[facility] [place] NAME: Craig Maldonado    MR#:  [id number]  DATE OF BIRTH:  1951-03-02  SUBJECTIVE:  CHIEF COMPLAINT:   Chief Complaint  Patient presents with  . Abdominal Pain   - Status post paracentesis today and 5 L taken out. Feels some better -Started on albumin and Lasix. Sodium is improving  REVIEW OF SYSTEMS:  Review of Systems  Constitutional: Positive for malaise/fatigue. Negative for chills and fever.  HENT: Negative for congestion, ear discharge, hearing loss, nosebleeds and sinus pain.   Eyes: Negative for blurred vision and double vision.  Respiratory: Negative for cough, shortness of breath and wheezing.   Cardiovascular: Positive for leg swelling. Negative for chest pain and palpitations.  Gastrointestinal: Positive for abdominal pain. Negative for constipation, diarrhea, nausea and vomiting.       Jaundiced  Genitourinary: Negative for dysuria.  Musculoskeletal: Positive for myalgias.  Neurological: Negative for dizziness, speech change, focal weakness, seizures and headaches.  Psychiatric/Behavioral: Negative for depression and suicidal ideas.    DRUG ALLERGIES:   Allergies  Allergen Reactions  . Codeine Nausea And Vomiting    VITALS:  Blood pressure 100/67, pulse 83, temperature 97.7 F (36.5 C), temperature source Oral, resp. rate 18, height 5\' 10"  (1.778 m), weight 96.3 kg (212 lb 6 oz), SpO2 96 %.  PHYSICAL EXAMINATION:  Physical Exam  GENERAL:  66 y.o.-year-old patient lying in the bed with no acute distress. Very jaundiced EYES: Pupils equal, round, reactive to light and accommodation. Icteric sclerae. Extraocular muscles intact.  HEENT: Head atraumatic, normocephalic. Oropharynx and nasopharynx clear.  NECK:  Supple, no jugular venous distention. No thyroid enlargement, no tenderness.  LUNGS: Normal breath sounds bilaterally, no wheezing, rales,rhonchi or crepitation. No use of accessory muscles of  respiration. Decreased bibasilar breath sounds CARDIOVASCULAR: S1, S2 normal. No murmurs, rubs, or gallops.  ABDOMEN: Soft, improved distension, non tender. Bowel sounds present. No organomegaly or mass.  EXTREMITIES: No pedal edema, cyanosis, or clubbing.  NEUROLOGIC: Cranial nerves II through XII are intact. Muscle strength 5/5 in all extremities. Sensation intact. Gait not checked. Global weakness present  PSYCHIATRIC: The patient is alert and oriented x 3.  SKIN: No obvious rash, lesion, or ulcer.    LABORATORY PANEL:   CBC Recent Labs  Lab 10/06/17 0523  WBC 13.8*  HGB 9.8*  HCT 29.3*  PLT 127*   ------------------------------------------------------------------------------------------------------------------  Chemistries  Recent Labs  Lab 10/05/17 0923  10/08/17 0433  NA 131*   < > 130*  K 3.7   < > 3.9  CL 100*   < > 101  CO2 22   < > 18*  GLUCOSE 133*   < > 128*  BUN 32*   < > 56*  CREATININE  UNABLE TO REPORT DUE TO ICTERUS SDR   < > UNABLE TO REPORT DUE TO ICTERUS  CALCIUM 8.2*   < > 8.5*  MG 2.1  --   --   AST  --    < > 194*  ALT  --    < > 99*  ALKPHOS  --    < > 130*  BILITOT  --    < > 33.0*   < > = values in this interval not displayed.   ------------------------------------------------------------------------------------------------------------------  Cardiac Enzymes No results for input(s): TROPONINI in the last 168 hours. ------------------------------------------------------------------------------------------------------------------  RADIOLOGY:  US Paracentesis  Result Date: 10/08/2017 INDICATION: Alcoholic cirrhosis and ascites.  Paracentesis requested. EXAM: ULTRASOUND  GUIDED PARACENTESIS MEDICATIONS: None. COMPLICATIONS: None immediate. PROCEDURE: Informed written consent was obtained from the patient after a discussion of the risks, benefits and alternatives to treatment. A timeout was performed prior to the initiation of the procedure.  Initial ultrasound scanning was performed to localize ascites. The right lower abdomen was prepped and draped in the usual sterile fashion. 1% lidocaine was used for local anesthesia. Following this, a 6 Fr Safe-T-Centesis catheter was introduced. An ultrasound image was saved for documentation purposes. The paracentesis was performed. The catheter was removed and a dressing was applied. The patient tolerated the procedure well without immediate post procedural complication. FINDINGS: A total of approximately 5 L of dark yellow fluid was removed. IMPRESSION: Successful ultrasound-guided paracentesis yielding 5 liters of peritoneal fluid. Electronically Signed   By: Aletta Edouard M.D.   On: 10/08/2017 10:10    EKG:   Orders placed or performed during the hospital encounter of 10/04/17  . ED EKG  . ED EKG    ASSESSMENT AND PLAN:   66 year old male with history of EtOH liver cirrhosis child's PUGH C with ongoing EtOH abuse, portal hypertensive gastropathy, ascites, esophageal varices with recent EGD showing grade a esophagitis, gastritis and duodenitis with visible vessel was discharged on 11/14 for hepatic encephalopathy and presents to the emergency room this time complaining of abdominal pain.  1. ETOH liver cirrhosis with recurrent ascites with suspected SBP/ chronic jaundice, thrombocytopenia paracentesis today and 5L taken out Continue cefepime for possible SBP GI consult appreciated. Has very poor prognosis. End-stage liver disease, not sure if he'll be a candidate for liver transplant due to his ongoing alcohol use. Continue lactulose, rifaximin - ammonia slightly elevated Follow final cultures diagnostic paracentesis He is supposed to be on trental for etoh hepatitis as unable to be compliant with prednisone taper -Patient continues to drink -Palliative care consulted this admission again as patient has expressed signs of despair and hopelessness  2. Hypotension: Last hospital stay  blood pressure medications including Lasix and nadolol were discontinued due to hypotension Monitor blood pressure  3. EtOH abuse: CIWA protocol  4. Hyponatremia-monitor, secondary to cirrhosis. improving -Nephrology consulted. Might need albumin infusion and restarted Lasix as blood pressure improved with midodrine.  5. Palliative care: Reconsulted   this admission now Family meeting with son tomorrow   Patient has overall very poor prognosis and he is aware of this.   Palliative care consulted. Physical therapy consulted. Overall poor prognosis   All the records are reviewed and case discussed with Care Management/Social Workerr. Management plans discussed with the patient, family and they are in agreement.  CODE STATUS: Full Code  TOTAL TIME TAKING CARE OF THIS PATIENT: 36 minutes.   POSSIBLE D/C IN 1-2 DAYS, DEPENDING ON CLINICAL CONDITION.   Gladstone Lighter M.D on 10/08/2017 at 1:48 PM  Between 7am to 6pm - Pager - [phone]  After 6pm go to [email or web address] - password [email or web address] [facility]  Office  [phone]  CC: Primary care physician; Lucilla Lame, MD

## 2017-10-08 NOTE — Progress Notes (Signed)
[facility] [place], [place] 10/08/17  Subjective:  Serum sodium is significantly improved and is up to 130. Creatinine cannot be calculated due to icterus.   Objective:  Vital signs in last 24 hours:  Temp:  [97.5 F (36.4 C)-98.4 F (36.9 C)] 97.7 F (36.5 C) (11/19 0758) Pulse Rate:  [80-84] 83 (11/19 0842) Resp:  [15-20] 18 (11/19 0842) BP: (99-109)/(54-67) 100/67 (11/19 0842) SpO2:  [96 %-99 %] 96 % (11/19 0842)  Weight change:  Filed Weights   10/04/17 1718 10/04/17 2153  Weight: 93.9 kg (207 lb) 96.3 kg (212 lb 6 oz)    Intake/Output:    Intake/Output Summary (Last 24 hours) at 10/08/2017 1133 Last data filed at 10/08/2017 0514 Gross per 24 hour  Intake 440 ml  Output 300 ml  Net 140 ml     Physical Exam: General:  Chronically ill-appearing, laying in the bed  HEENT  significant scleral icterus, moist oral mucous membranes  Neck  supple  Pulm/lungs  normal breathing effort on room air, clear to auscultation  CVS/Heart  S1S2 no rubs  Abdomen:   Soft, distended, ascites  Extremities:  1+ dependent edema  Neurologic:  Alert, oriented, answers questions  Skin:  Significant icterus          Basic Metabolic Panel:  Recent Labs  Lab 10/03/17 0612 10/04/17 1717 10/05/17 0923 10/06/17 0523 10/07/17 0530 10/08/17 0433  NA 136 132* 131* 129* 127* 130*  K 3.2* 3.0* 3.7 3.8 3.8 3.9  CL 102 99* 100* 99* 98* 101  CO2 26 21* 22 22 19* 18*  GLUCOSE 104* 178* 133* 138* 116* 128*  BUN 25* 27* 32* 41* 46* 56*  CREATININE UNABLE TO REPORT DUE TO ICTERUS UNABLE TO REPORT DUE TO ICTERUS  UNABLE TO REPORT DUE TO ICTERUS SDR UNABLE TO REPORT DUE TO ICTERUS. [facility]. UNABLE TO REPORT DUE TO ICTERUS. [place]. UNABLE TO REPORT DUE TO ICTERUS  CALCIUM 8.7* 8.7* 8.2* 8.3* 8.2* 8.5*  MG 2.0  --  2.1  --   --   --   PHOS  --   --  UNABLE TO REPORT DUE TO ICTERUS  --   --   --      CBC: Recent Labs  Lab 10/04/17 1717 10/05/17 0420 10/06/17 0523  WBC  19.9* 13.5* 13.8*  HGB 10.3* 9.5* 9.8*  HCT 32.6* 29.3* 29.3*  MCV 85.7 85.1 85.1  PLT 156 108* 127*      Lab Results  Component Value Date   HEPBSAG Negative 09/28/2017      Microbiology:  Recent Results (from the past 240 hour(s))  Body fluid culture     Status: None   Collection Time: 10/04/17  7:11 PM  Result Value Ref Range Status   Specimen Description PERITONEAL  Final   Special Requests Normal  Final   Gram Stain   Final    CYTOSPIN SMEAR WBC PRESENT,BOTH PMN AND MONONUCLEAR NO ORGANISMS SEEN    Culture   Final    NO GROWTH 4 DAYS Performed at [facility], [place] [address] [place], [place] [address]    Report Status 10/08/2017 FINAL  Final  MRSA PCR Screening     Status: None   Collection Time: 10/05/17  5:29 AM  Result Value Ref Range Status   MRSA by PCR NEGATIVE NEGATIVE Final    Comment:        The GeneXpert MRSA Assay (FDA approved for NASAL specimens only), is one component of  a comprehensive MRSA colonization surveillance program. It is not intended to diagnose MRSA infection nor to guide or monitor treatment for MRSA infections.     Coagulation Studies: No results for input(s): LABPROT, INR in the last 72 hours.  Urinalysis: No results for input(s): COLORURINE, LABSPEC, PHURINE, GLUCOSEU, HGBUR, BILIRUBINUR, KETONESUR, PROTEINUR, UROBILINOGEN, NITRITE, LEUKOCYTESUR in the last 72 hours.  Invalid input(s): APPERANCEUR    Imaging: US Paracentesis  Result Date: 10/08/2017 INDICATION: Alcoholic cirrhosis and ascites.  Paracentesis requested. EXAM: ULTRASOUND GUIDED PARACENTESIS MEDICATIONS: None. COMPLICATIONS: None immediate. PROCEDURE: Informed written consent was obtained from the patient after a discussion of the risks, benefits and alternatives to treatment. A timeout was performed prior to the initiation of the procedure. Initial ultrasound scanning was performed to localize ascites. The right lower abdomen was prepped and draped  in the usual sterile fashion. 1% lidocaine was used for local anesthesia. Following this, a 6 Fr Safe-T-Centesis catheter was introduced. An ultrasound image was saved for documentation purposes. The paracentesis was performed. The catheter was removed and a dressing was applied. The patient tolerated the procedure well without immediate post procedural complication. FINDINGS: A total of approximately 5 L of dark yellow fluid was removed. IMPRESSION: Successful ultrasound-guided paracentesis yielding 5 liters of peritoneal fluid. Electronically Signed   By: Aletta Edouard M.D.   On: 10/08/2017 10:10     Medications:   . albumin human     . ceFEPIme (MAXIPIME) IVPB  500 mg Intravenous Q8H  . feeding supplement (ENSURE ENLIVE)  237 mL Oral BID BM  . folic acid  1 mg Oral Daily  . furosemide  40 mg Oral BID  . lactulose  20 g Oral TID  . magnesium oxide  400 mg Oral Daily  . midodrine  5 mg Oral TID WC  . pantoprazole  40 mg Oral BID  . pentoxifylline  400 mg Oral TID WC  . potassium chloride SA  20 mEq Oral BID  . rifaximin  550 mg Oral BID  . sodium chloride flush  3 mL Intravenous Q12H  . thiamine  100 mg Oral Daily   morphine injection, ondansetron **OR** ondansetron (ZOFRAN) IV, oxyCODONE-acetaminophen, sodium chloride flush  Assessment/ Plan:  66 y.o. male with alcohol abuse, cirrhosis of the liver, gastric varices, hyperbilirubinemia, presents for abdominal distention, GI upset.  Nephrology consult for hyponatremia  1.  Hyponatremia 2.  Decompensated alcoholic cirrhosis with significant jaundice and ascites 3.  Lower extremity edema  -Serum sodium up to 130.  BUN appears to be rising.  Therefore we will hold Lasix for now.  We will need to continue to monitor renal function as well as serum sodium.  We will also need to clinically monitor the patient's lower extremity edema.    LOS: [place], Burdett Pinzon 11/19/201811:33 AM  [place],  [place]

## 2017-10-09 ENCOUNTER — Ambulatory Visit: Payer: Medicare Other | Admitting: Nurse Practitioner

## 2017-10-09 DIAGNOSIS — D529 Folate deficiency anemia, unspecified: Secondary | ICD-10-CM | POA: Diagnosis not present

## 2017-10-09 DIAGNOSIS — I864 Gastric varices: Secondary | ICD-10-CM | POA: Diagnosis not present

## 2017-10-09 DIAGNOSIS — R4182 Altered mental status, unspecified: Secondary | ICD-10-CM | POA: Diagnosis not present

## 2017-10-09 DIAGNOSIS — M6281 Muscle weakness (generalized): Secondary | ICD-10-CM | POA: Diagnosis not present

## 2017-10-09 DIAGNOSIS — E539 Vitamin B deficiency, unspecified: Secondary | ICD-10-CM | POA: Diagnosis not present

## 2017-10-09 DIAGNOSIS — Z7401 Bed confinement status: Secondary | ICD-10-CM | POA: Diagnosis not present

## 2017-10-09 DIAGNOSIS — E876 Hypokalemia: Secondary | ICD-10-CM | POA: Diagnosis not present

## 2017-10-09 DIAGNOSIS — Z5189 Encounter for other specified aftercare: Secondary | ICD-10-CM | POA: Diagnosis not present

## 2017-10-09 DIAGNOSIS — Z7189 Other specified counseling: Secondary | ICD-10-CM | POA: Diagnosis not present

## 2017-10-09 DIAGNOSIS — I739 Peripheral vascular disease, unspecified: Secondary | ICD-10-CM | POA: Diagnosis not present

## 2017-10-09 DIAGNOSIS — K7031 Alcoholic cirrhosis of liver with ascites: Secondary | ICD-10-CM | POA: Diagnosis not present

## 2017-10-09 DIAGNOSIS — K852 Alcohol induced acute pancreatitis without necrosis or infection: Secondary | ICD-10-CM | POA: Diagnosis not present

## 2017-10-09 LAB — BASIC METABOLIC PANEL
ANION GAP: 9 (ref 5–15)
BUN: 63 mg/dL — AB (ref 6–20)
CO2: 21 mmol/L — ABNORMAL LOW (ref 22–32)
Calcium: 8.6 mg/dL — ABNORMAL LOW (ref 8.9–10.3)
Chloride: 102 mmol/L (ref 101–111)
Glucose, Bld: 131 mg/dL — ABNORMAL HIGH (ref 65–99)
POTASSIUM: 3.5 mmol/L (ref 3.5–5.1)
SODIUM: 132 mmol/L — AB (ref 135–145)

## 2017-10-09 LAB — MAGNESIUM: MAGNESIUM: 3 mg/dL — AB (ref 1.7–2.4)

## 2017-10-09 MED ORDER — PENTOXIFYLLINE ER 400 MG PO TBCR: 400.0000 mg | EXTENDED_RELEASE_TABLET | Freq: Three times a day (TID) | ORAL | 0 refills | Status: AC

## 2017-10-09 MED ORDER — FUROSEMIDE 40 MG PO TABS: 40.0000 mg | ORAL_TABLET | Freq: Every day | ORAL | 0 refills | Status: AC

## 2017-10-09 MED ORDER — RIFAXIMIN 550 MG PO TABS: 550.0000 mg | ORAL_TABLET | Freq: Two times a day (BID) | ORAL | 0 refills | Status: AC

## 2017-10-09 MED ORDER — PANTOPRAZOLE SODIUM 40 MG PO TBEC: 40.0000 mg | DELAYED_RELEASE_TABLET | Freq: Two times a day (BID) | ORAL | 0 refills | Status: AC

## 2017-10-09 MED ORDER — POTASSIUM CHLORIDE CRYS ER 20 MEQ PO TBCR: 20.0000 meq | EXTENDED_RELEASE_TABLET | Freq: Two times a day (BID) | ORAL | 0 refills | Status: AC

## 2017-10-09 MED ORDER — ENSURE ENLIVE PO LIQD: 237.0000 mL | Freq: Two times a day (BID) | ORAL | 12 refills | Status: AC

## 2017-10-09 MED ORDER — CIPROFLOXACIN HCL 500 MG PO TABS: 500.0000 mg | ORAL_TABLET | Freq: Every day | ORAL | 0 refills | Status: AC

## 2017-10-09 MED ORDER — LACTULOSE 10 GM/15ML PO SOLN: 20.0000 g | Freq: Three times a day (TID) | ORAL | 0 refills | Status: AC

## 2017-10-09 MED ORDER — MIDODRINE HCL 5 MG PO TABS: 5.0000 mg | ORAL_TABLET | Freq: Three times a day (TID) | ORAL | 0 refills | Status: AC

## 2017-10-09 MED ORDER — CIPROFLOXACIN HCL 500 MG PO TABS
500.0000 mg | ORAL_TABLET | Freq: Every day | ORAL | Status: DC
  Administered 2017-10-09: 500 mg via ORAL
  Filled 2017-10-09: qty 1

## 2017-10-09 NOTE — Plan of Care (Signed)
  Progressing Education: Knowledge of General Education information will improve 10/09/2017 0939 - Progressing by Rowe Robert, RN Health Behavior/Discharge Planning: Ability to manage health-related needs will improve 10/09/2017 0939 - Progressing by Rowe Robert, RN Clinical Measurements: Ability to maintain clinical measurements within normal limits will improve 10/09/2017 0939 - Progressing by Rowe Robert, RN Will remain free from infection 10/09/2017 0939 - Progressing by Rowe Robert, RN Diagnostic test results will improve 10/09/2017 0939 - Progressing by Rowe Robert, RN Activity: Risk for activity intolerance will decrease 10/09/2017 0939 - Progressing by Rowe Robert, RN Coping: Level of anxiety will decrease 10/09/2017 0939 - Progressing by Rowe Robert, RN Elimination: Will not experience complications related to bowel motility 10/09/2017 0939 - Progressing by Rowe Robert, RN Will not experience complications related to urinary retention 10/09/2017 0939 - Progressing by Rowe Robert, RN Pain Managment: General experience of comfort will improve 10/09/2017 0939 - Progressing by Rowe Robert, RN Safety: Ability to remain free from injury will improve 10/09/2017 0939 - Progressing by Rowe Robert, RN Education: Knowledge of Pancreatitis treatment and prevention will improve 10/09/2017 0939 - Progressing by Rowe Robert, [place] Behavior/Discharge Planning: Ability to formulate a plan to maintain an alcohol-free life will improve 10/09/2017 0939 - Progressing by Rowe Robert, RN Physical Regulation: Complications related to the disease process, condition or treatment will be avoided or minimized 10/09/2017 0939 - Progressing by Rowe Robert, RN

## 2017-10-09 NOTE — Progress Notes (Signed)
Pt to be discharged to [facility] via ems they have been called. No resp distress.  Some anxiety at times. incont of urine and stool .  Skin golden color d/t to severe jaundice.  Changed into transfer pack for transfer. Refused po meds today but did take cipro abx. Report called to [facility]. Waiting on  Ems to transport.

## 2017-10-09 NOTE — Clinical Social Work Placement (Signed)
   CLINICAL SOCIAL WORK PLACEMENT  NOTE  Date:  10/09/2017  Patient Details  Name: Craig Maldonado MRN: [id number] Date of Birth: 1951-06-28  Clinical Social Work is seeking post-discharge placement for this patient at the [place] level of care (*CSW will initial, date and re-position this form in  chart as items are completed):  Yes   Patient/family provided with [facility] Work Department's list of facilities offering this level of care within the geographic area requested by the patient (or if unable, by the patient's family).  Yes   Patient/family informed of their freedom to choose among providers that offer the needed level of care, that participate in Medicare, Medicaid or managed care program needed by the patient, have an available bed and are willing to accept the patient.      Patient/family informed of [facility]'s ownership interest in [facility] and [facility], as well as of the fact that they are under no obligation to receive care at these facilities.  PASRR submitted to EDS on 10/06/17     PASRR number received on       Existing PASRR number confirmed on 10/06/17     FL2 transmitted to all facilities in geographic area requested by pt/family on 10/09/17     FL2 transmitted to all facilities within larger geographic area on       Patient informed that his/her managed care company has contracts with or will negotiate with certain facilities, including the following:        Yes   Patient/family informed of bed offers received.  Patient chooses bed at [facility]     Physician recommends and patient chooses bed at      Patient to be transferred to [facility] [place] on 10/09/17.  Patient to be transferred to facility by [facility]     Patient family notified on 10/09/17 of transfer.  Name of family member notified:  Anderson Malta [phone]     PHYSICIAN       Additional Comment:     _______________________________________________ Ross Ludwig, [place] 10/09/2017, 2:41 PM

## 2017-10-09 NOTE — Clinical Social Work Note (Signed)
CSW received a phone call from patient's son Billee Cashing [phone], regarding SNF placement at [facility].  Patient's son is aware that patient will be going to SNF today.  CSW made a note to have bedside nurse contact patient's son once he has been picked up by EMS.  Jones Broom. [place], MSW, [place]  10/09/2017 4:55 PM

## 2017-10-09 NOTE — Clinical Social Work Note (Addendum)
CSW spoke to patient and he is now agreeable to going to SNF for short term rehab.  Patient will have palliative follow him at [facility].  CSW contacted [place] [facility] agency nurse liasian to inform her of the referral for patient to have palliative to follow.  Patient chose [facility].  Patient to be d/c'ed today to [facility] [place] room 709.  Patient and family agreeable to plans will transport via ems RN to call report [phone] Craig Maldonado.  CSW spoke to [place] patient's daughter in law who will let patient's son Craig Maldonado know, [phone].  Evette Cristal, MSW, [place]

## 2017-10-09 NOTE — Evaluation (Signed)
Physical Therapy Evaluation Patient Details Name: Craig Maldonado MRN: [id number] DOB: 08/13/1951 Today's Date: 10/09/2017   History of Present Illness  Craig Maldonado is a 66 y.o. 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 And appears very weak.  He had some abdominal pain for which IV morphine was given.  He was found to have elevated white count of 19,000 and lipase of 292. ER physician did body fluid tap diagnostic and cell count is still pending.  It looked clear according to the ER MD. Pt is now admitted for end stage alcoholic cirrhosis of liver, hypotension, and hyponatremia. History is obtained from medical record. Pt is AOx1 at time of PT evaluation. Unclear accuracy of history.  Clinical Impression  Pt admitted with above diagnosis. Pt currently with functional limitations due to the deficits listed below (see PT Problem List). History is obtained from medical record. Pt is AOx1 at time of PT evaluation. Unclear accuracy of history he provides during evaluation today. Pt is very limited in his willingness to participate with therapy evaluation on this date. He requires minA+1 from therapist for bed mobility. Pt requires HOB elevated, use of bed rails, and assist from therapist to come up to sitting. Increased time to perform. Once in sitting pt refuses to attempt transfers or ambulation despite repeated encouragement from therapist. He appears grossly weak but difficult to assess due to limited participate. He will need SNF placement although is a poor candidate due to unwillingness to participate adequately with therapy. Pt will benefit from PT services to address deficits in strength, balance, and mobility in order to return to full function at home.     Follow Up Recommendations SNF;Other (comment)(Pt refuses SNF placement)    Equipment Recommendations  None recommended by PT    Recommendations for Other Services       Precautions / Restrictions Precautions Precautions: Fall Restrictions Weight Bearing Restrictions: No      Mobility  Bed Mobility Overal bed mobility: Needs Assistance Bed Mobility: Supine to Sit;Sit to Supine     Supine to sit: Min assist Sit to supine: Min assist   General bed mobility comments: Pt requires HOB elevated, use of bed rails, and assist from therapist to come up to sitting. Increased time to perform. Once in sitting pt refuses to attempt transfers or ambulation despite repeated encouragement from therapist  Transfers                    Ambulation/Gait                Stairs            Wheelchair Mobility    Modified Rankin (Stroke Patients Only)       Balance Overall balance assessment: Needs assistance Sitting-balance support: No upper extremity supported Sitting balance-Leahy Scale: Good                                       Pertinent Vitals/Pain Pain Assessment: 0-10 Pain Score: 5  Pain Location: Abdominal pain Pain Intervention(s): Monitored during session    [place] expects to be discharged to:: Private residence Living Arrangements: Children(Son per patient) Available Help at Discharge: Family;Available 24 hours/day Type of Home: House Home Access: Level entry  Home Layout: One level Home Equipment: Walker - 2 wheels;Walker - 4 wheels;Bedside commode;Shower seat      Prior Function Level of Independence: Independent         Comments: Per medical record he normally does his grocery shopping, etc. He states that he does not use an assistive device for ambulation     Hand Dominance   Dominant Hand: Left    Extremity/Trunk Assessment   Upper Extremity Assessment Upper Extremity Assessment: Overall WFL for tasks assessed    Lower Extremity Assessment Lower Extremity  Assessment: Difficult to assess due to impaired cognition(Pt able to perform SLR bilaterally, refuses to transfer)       Communication   Communication: No difficulties  Cognition Arousal/Alertness: Awake/alert Behavior During Therapy: Restless;Agitated Overall Cognitive Status: No family/caregiver present to determine baseline cognitive functioning                                 General Comments: Pt is AOx1 at time of PT evaluation      General Comments      Exercises     Assessment/Plan    PT Assessment Patient needs continued PT services  PT Problem List Decreased strength;Decreased activity tolerance;Decreased balance;Decreased cognition       PT Treatment Interventions DME instruction;Gait training;Functional mobility training;Neuromuscular re-education;Balance training;Therapeutic exercise;Therapeutic activities;Patient/family education    PT Goals (Current goals can be found in the Care Plan section)  Acute Rehab PT Goals Patient Stated Goal: go home PT Goal Formulation: With patient Time For Goal Achievement: 10/23/17 Potential to Achieve Goals: Fair    Frequency Min 2X/week   Barriers to discharge Other (comment)(Refuses to participate adequately with therapy)      Co-evaluation               AM-PAC PT "6 Clicks" Daily Activity  Outcome Measure Difficulty turning over in bed (including adjusting bedclothes, sheets and blankets)?: A Little Difficulty moving from lying on back to sitting on the side of the bed? : Unable Difficulty sitting down on and standing up from a chair with arms (e.g., wheelchair, bedside commode, etc,.)?: Unable Help needed moving to and from a bed to chair (including a wheelchair)?: A Lot Help needed walking in hospital room?: A Lot Help needed climbing 3-5 steps with a railing? : A Lot 6 Click Score: 11    End of Session Equipment Utilized During Treatment: Gait belt Activity Tolerance: Other  (comment)(Limited by cognition and motivation) Patient left: in bed;with call bell/phone within reach;with bed alarm set   PT Visit Diagnosis: Muscle weakness (generalized) (M62.81);Difficulty in walking, not elsewhere classified (R26.2)    Time: 4315-4008 PT Time Calculation (min) (ACUTE ONLY): 12 min   Charges:   PT Evaluation $PT Eval Low Complexity: 1 Low     PT G Codes:   PT G-Codes **NOT FOR INPATIENT CLASS** Functional Assessment Tool Used: AM-PAC 6 Clicks Basic Mobility Functional Limitation: Mobility: Walking and moving around Mobility: Walking and Moving Around Current Status (Q7619): At least 60 percent but less than 80 percent impaired, limited or restricted Mobility: Walking and Moving Around Goal Status [phone]): At least 20 percent but less than 40 percent impaired, limited or restricted    Phillips Grout PT, DPT    Tytiana Coles 10/09/2017, 12:03 PM

## 2017-10-09 NOTE — Progress Notes (Signed)
Craig Antigua, MD [address] [place], [place], [place], [address] [address] [place], [place], [place], [address] Phone: [phone]  Fax: [phone]   Subjective:  Patient laying in bed, feels symptomatically better after paracentesis, 5 L yesterday  Objective: Vital signs in last 24 hours: Vitals:   10/08/17 2018 10/09/17 0324 10/09/17 0902 10/09/17 1701  BP: (!) 105/58 96/62 102/62 (!) 94/57  Pulse: 84 85 86 89  Resp: 16 16 20    Temp: 98.2 F (36.8 C) 97.8 F (36.6 C) 98 F (36.7 C) 98 F (36.7 C)  TempSrc: Oral Oral Oral Oral  SpO2: 95% 95% 97% 97%  Weight:      Height:       Weight change:   Intake/Output Summary (Last 24 hours) at 10/09/2017 1719 Last data filed at 10/09/2017 1600 Gross per 24 hour  Intake 308 ml  Output 300 ml  Net 8 ml     Exam: Cardiac: +S1, +S2, RRR, No edema Pulm: CTA b/l, Normal Resp Effort Abd: Soft, NT/ND, No HSM Skin: Warm, no rashes Neck: Supple, Trachea midline   Lab Results: @LABTEST2 @ Micro Results: Recent Results (from the past 240 hour(s))  Body fluid culture     Status: None   Collection Time: 10/04/17  7:11 PM  Result Value Ref Range Status   Specimen Description PERITONEAL  Final   Special Requests Normal  Final   Gram Stain   Final    CYTOSPIN SMEAR WBC PRESENT,BOTH PMN AND MONONUCLEAR NO ORGANISMS SEEN    Culture   Final    NO GROWTH 4 DAYS Performed at [facility], [address], [place], [place] [address]    Report Status 10/08/2017 FINAL  Final  MRSA PCR Screening     Status: None   Collection Time: 10/05/17  5:29 AM  Result Value Ref Range Status   MRSA by PCR NEGATIVE NEGATIVE Final    Comment:        The GeneXpert MRSA Assay (FDA approved for NASAL specimens only), is one component of a comprehensive MRSA colonization surveillance program. It is not intended to diagnose MRSA infection nor to guide or monitor treatment for MRSA infections.     Studies/Results: US Paracentesis  Result Date: 10/08/2017 INDICATION: Alcoholic cirrhosis and ascites.  Paracentesis requested. EXAM: ULTRASOUND GUIDED PARACENTESIS MEDICATIONS: None. COMPLICATIONS: None immediate. PROCEDURE: Informed written consent was obtained from the patient after a discussion of the risks, benefits and alternatives to treatment. A timeout was performed prior to the initiation of the procedure. Initial ultrasound scanning was performed to localize ascites. The right lower abdomen was prepped and draped in the usual sterile fashion. 1% lidocaine was used for local anesthesia. Following this, a 6 Fr Safe-T-Centesis catheter was introduced. An ultrasound image was saved for documentation purposes. The paracentesis was performed. The catheter was removed and a dressing was applied. The patient tolerated the procedure well without immediate post procedural complication. FINDINGS: A total of approximately 5 L of dark yellow fluid was removed. IMPRESSION: Successful ultrasound-guided paracentesis yielding 5 liters of peritoneal fluid. Electronically Signed   By: Aletta Edouard M.D.   On: 10/08/2017 10:10   Medications:  Scheduled Meds: . ciprofloxacin  500 mg Oral Daily  . feeding supplement (ENSURE ENLIVE)  237 mL Oral BID BM  . folic acid  1 mg Oral Daily  . lactulose  20 g Oral TID  . midodrine  5 mg Oral TID WC  . pantoprazole  40 mg Oral  BID  . pentoxifylline  400 mg Oral TID WC  . potassium chloride SA  20 mEq Oral BID  . rifaximin  550 mg Oral BID  . sodium chloride flush  3 mL Intravenous Q12H  . thiamine  100 mg Oral Daily   Continuous Infusions: . albumin human 0 g (10/08/17 2330)   PRN Meds:.ondansetron **OR** ondansetron (ZOFRAN) IV, sodium chloride flush   Assessment: Active Problems:   Ascites  Alcoholic cirrhosis  Plan: Patient's bilirubin is now declining and is expected to continue to improve as long as no further hepatotoxic medications or drugs  are used. Patient encouraged to avoid any alcohol or hepatotoxic drugs and verbalized understanding. Can restart Lasix, spironolactone Continue lactulose and rifaximin Repeat CMP daily, if patient is getting discharged repeat In 1 week Continue folate thiamine Continue good nutrition Follow up in GI clinic in 2-3 weeks   LOS: 5 days   Craig Antigua, MD 10/09/2017, 5:19 PM

## 2017-10-09 NOTE — Progress Notes (Signed)
MEDICATION RELATED CONSULT NOTE- follow up  Pharmacy Consult for electrolyte monitoring and replacement Indication: hypokalemia  Allergies  Allergen Reactions  . Codeine Nausea And Vomiting   Patient Measurements: Height: 5\' 10"  (177.8 cm) Weight: 212 lb 6 oz (96.3 kg) IBW/kg (Calculated) : 73  Vital Signs: Temp: 97.8 F (36.6 C) (11/20 0324) Temp Source: Oral (11/20 0324) BP: 96/62 (11/20 0324) Pulse Rate: 85 (11/20 0324) Intake/Output from previous day: 11/19 0701 - 11/20 0700 In: 258 [I.V.:3; IV Piggyback:255] Out: 300 [Urine:300] Intake/Output from this shift: No intake/output data recorded.  Labs: Recent Labs    10/07/17 0530 10/08/17 0433 10/09/17 0358  CREATININE UNABLE TO REPORT DUE TO ICTERUS. TCH. UNABLE TO REPORT DUE TO ICTERUS NOT CALCULATED  MG  --   --  3.0*  ALBUMIN  --  2.2*  --   PROT  --  6.2*  --   AST  --  194*  --   ALT  --  99*  --   ALKPHOS  --  130*  --   BILITOT  --  33.0*  --    Lab Results  Component Value Date   K 3.5 10/09/2017   CrCl cannot be calculated (This lab value cannot be used to calculate CrCl because it is not a number: NOT CALCULATED).  Assessment: Pharmacy consulted to monitor and replace electrolytes if needed in this 66 year old male who was recently discharged on mag-ox 400 mg PO daily and KCl 20 mEq PO BID.  Goal of Therapy: Electrolytes WNL  Plan:  K = 3.5, Mag 3.0, Scr still unavailable Will discontinue mag-ox 400 mg PO daily  No additional supplementation needed at this time.  Continue KCl 20 mEq PO BID. Will recheck electrolytes with AM labs tomorrow.   Rocky Morel, PharmD, BCPS Clinical Pharmacist 10/09/2017,7:38 AM

## 2017-10-09 NOTE — Progress Notes (Signed)
New referral for out patient palliative to follow at [facility] received from [place]. Plan is for discharge today. Patient information faxed to referral. Thank you. Flo Shanks RN, BSN, [facility] [place], hospital Liaison [phone] c

## 2017-10-09 NOTE — Plan of Care (Signed)
VSS, free of falls during shift. Withdrawn throughout shift.  Denies pain.  Reported nausea w/o emesis, improved w/ PRN IV Zofran 4mg .  No other complaints overnight.  Incontinent of stool, encouraged to call out when needs bedpan or changing.  Bed in low position, bed alarm on.  Call bell within reach, [place].

## 2017-10-09 NOTE — Progress Notes (Addendum)
Daily Progress Note   Patient Name: Craig Maldonado       Date: 10/09/2017 DOB: [birth year]  Age: 66 y.o. MRN#: [id number] Attending Physician: Gladstone Lighter, MD Primary Care Physician: Lucilla Lame, MD Admit Date: 10/04/2017  Reason for Consultation/Follow-up: Establishing goals of care  Subjective: Craig Maldonado is resting in bed. He appears to have less jaundice today. He states his son will not be present for the meeting today. He states he is tired, but is unsure if he is ready to stop aggressive treatment and needs time to think about his options. Recommend palliative care to follow outpatient for further discussions.    Length of Stay: 5  Current Medications: Scheduled Meds:  . ciprofloxacin  500 mg Oral Daily  . feeding supplement (ENSURE ENLIVE)  237 mL Oral BID BM  . folic acid  1 mg Oral Daily  . lactulose  20 g Oral TID  . midodrine  5 mg Oral TID WC  . pantoprazole  40 mg Oral BID  . pentoxifylline  400 mg Oral TID WC  . potassium chloride SA  20 mEq Oral BID  . rifaximin  550 mg Oral BID  . sodium chloride flush  3 mL Intravenous Q12H  . thiamine  100 mg Oral Daily    Continuous Infusions: . albumin human 0 g (10/08/17 2330)    PRN Meds: ondansetron **OR** ondansetron (ZOFRAN) IV, sodium chloride flush  Physical Exam  Constitutional: No distress.  Eyes: EOM are normal.  Pulmonary/Chest: Effort normal.  Skin: Skin is warm and dry.            Vital Signs: BP 102/62 (BP Location: Left Arm)   Pulse 86   Temp 98 F (36.7 C) (Oral)   Resp 20   Ht 5\' 10"  (1.778 m)   Wt 96.3 kg (212 lb 6 oz)   SpO2 97%   BMI 30.47 kg/m  SpO2: SpO2: 97 % O2 Device: O2 Device: Not Delivered O2 Flow Rate:    Intake/output summary:   Intake/Output Summary (Last 24 hours) at  10/09/2017 1407 Last data filed at 10/09/2017 0500 Gross per 24 hour  Intake 258 ml  Output 300 ml  Net -42 ml   LBM: Last BM Date: 10/08/17 Baseline Weight: Weight: 93.9 kg (207 lb) Most recent weight: Weight: 96.3 kg (212  lb 6 oz)       Palliative Assessment/Data: 50%      Patient Active Problem List   Diagnosis Date Noted  . Ascites 10/04/2017  . LFTs abnormal   . Palliative care by specialist   . Encephalopathy acute 09/29/2017  . Hyperkalemia 09/27/2017  . Alcoholic cirrhosis of liver with ascites ([place]) 09/20/2017  . Alcohol abuse 09/20/2017  . Hyperbilirubinemia 09/20/2017  . Transaminitis 09/20/2017  . AKI (acute kidney injury) ([place]) 09/20/2017  . Gastrointestinal hemorrhage associated with anorectal source   . GI bleed 02/11/2016  . Blood in stool   . Esophageal varices without bleeding ([place])   . Esophagogastric ulcer   . Duodenitis   . Cardiac murmur 04/19/2015  . Back pain, chronic 04/19/2015  . Arthralgia of hip 04/19/2015  . Leg pain 04/19/2015  . Current tobacco use 04/19/2015  . Blood glucose elevated 04/19/2015  . Elevated blood sugar 04/19/2015  . Abnormal prostate specific antigen 04/19/2015  . Excoriated eczema 04/19/2015  . Breast development in males 04/19/2015  . Benign hypertension 04/19/2015  . Alcohol induced liver disorder ([place]) 04/19/2015  . Benign prostatic hypertrophy without urinary obstruction 04/19/2015  . Screening for depression 04/19/2015  . Cigarette smoker 04/19/2015  . Goals of care, counseling/discussion 04/19/2015    Palliative Care Assessment & Plan   Patient Profile: Craig Maldonado a 66 y.o.malewith known history of decompensated alcoholic liver cirrhosis with multiple repeated admissions for abdominal distension, encephalopathy admitted with abdominal pain.  Assessment: Craig Maldonado appears to have slightly less juandice today. He is unsure if he would like to continue aggressive care but needs more time to think  about it.   Recommendations/Plan:  Discharging to SNF today. Palliative care to follow outpatient.   Code Status:    Code Status Orders  (From admission, onward)        Start     Ordered   10/04/17 2148  Full code  Continuous     10/04/17 2147    Code Status History    Date Active Date Inactive Code Status Order ID Comments User Context   09/27/2017 23:25 10/03/2017 17:02 Full Code [id number]  Gorden Harms, MD Inpatient   09/21/2017 00:50 09/26/2017 19:27 Full Code [id number]  Lance Coon, MD ED   02/11/2016 07:36 02/22/2016 20:28 Full Code [id number]  Loletha Grayer, MD ED       Prognosis:   < 6 months  Discharge Planning:  [place] for rehab with Palliative care service follow-up  Care plan was discussed with primary RN  Thank you for allowing the Palliative Medicine Team to assist in the care of this patient.   Total Time 35 min Prolonged Time Billed  No      Greater than 50%  of this time was spent counseling and coordinating care related to the above assessment and plan.  Asencion Gowda, NP 10/09/2017 2:18 PM Office: [phone] 7am-7pm  Please see Amion for pager number and availability  Call primary team after hours  Please contact Palliative Medicine Team phone at [phone] for questions and concerns.

## 2017-10-09 NOTE — Discharge Summary (Addendum)
[facility] [place] NAME: Craig Maldonado    MR#:  [id number]  DATE OF BIRTH:  01/23/1951  DATE OF ADMISSION:  10/04/2017   ADMITTING PHYSICIAN: Fritzi Mandes, MD  DATE OF DISCHARGE: 10/09/17  PRIMARY CARE PHYSICIAN: Lucilla Lame, MD   ADMISSION DIAGNOSIS:   Alcohol-induced acute pancreatitis, unspecified complication status [H85.27]  DISCHARGE DIAGNOSIS:   Active Problems:   Ascites   SECONDARY DIAGNOSIS:   Past Medical History:  Diagnosis Date  . Alcohol abuse   . Alcoholic cirrhosis ([place])   . Gastric varices     HOSPITAL COURSE:   66 year old male with history of EtOH liver cirrhosis [place] ongoing EtOH abuse,portal hypertensive gastropathy, ascites, esophageal varices with recent EGD showing grade a esophagitis, gastritis and duodenitis with visible vessel was discharged on 11/14 for hepatic encephalopathy and presents to the emergency room this time complaining of abdominal pain.  1. EtOH liver cirrhosis with recurrent ascites with suspected SBP/chronic jaundice, thrombocytopenia - Patient had paracentesis this admission and almost 5 L sciatic fluid taken out. -On cefepime since admission for SBP, fluid cultures are negative for any bacteria. Discharge on ciprofloxacin daily for prophylaxis GI consult appreciated. Has very poor prognosis. End-stage liver disease, not a candidate for liver transplant due to his ongoing alcohol use. Continue lactulose, rifaximin - ammonia slightly elevated He is supposed to be on TRENTAL  for etoh hepatitis as unable to be compliant with prednisone taper-restarted on the Trental -Strongly counseled against drinking -Palliative care consulted this admission again as patient has expressed signs of despair and hopelessness -Patient now understands that his prognosis is poor, wants to discuss with his son about hospice services  2. Hypotension: Last hospital stay blood pressure  medications including Lasix and nadolol were discontinued due to hypotension Midodrine added for blood pressure and restarted on Lasix due to hyponatremia.  3. EtOH abuse: No withdraws this admission. Strongly counseled against drinking again  4. Hyponatremia-monitor, secondary to cirrhosis.  -Nephrology consulted. Received albumin infusions and also Lasix was restarted. BUN started to increase, so Lasix discontinued IV and changed to daily. -Due 2 soft blood pressures, started on midodrine -Sodium has improved  5. Palliative care: Reconsulted again this admission. Patient wants them to discuss plan with his son today prior to discharge. -Continue palliative care follow up as outpatient  Patient has been complaining of weakness, he refused to work with physical therapy for the last 2 days. Agreed for rehab now Overall poor prognosis, code status addressed again and patient wants to eb full code Attempted to reach his son- but unable to.  [facility] today HIGH RISK FOR RE-ADMISSION   DISCHARGE CONDITIONS:   Critical  CONSULTS OBTAINED:   Treatment Team:  Virgel Manifold, MD Murlean Iba, MD  DRUG ALLERGIES:   Allergies  Allergen Reactions  . Codeine Nausea And Vomiting   DISCHARGE MEDICATIONS:   Allergies as of 10/09/2017      Reactions   Codeine Nausea And Vomiting      Medication List    STOP taking these medications   ferrous sulfate 325 (65 FE) MG tablet   magnesium oxide 400 (241.3 Mg) MG tablet Commonly known as:  MAG-OX   multivitamin with minerals Tabs tablet     TAKE these medications   ciprofloxacin 500 MG tablet Commonly known as:  CIPRO Take 1 tablet (500 mg total) by mouth daily.   feeding supplement (ENSURE ENLIVE) Liqd Take 237 mLs by  mouth 2 (two) times daily between meals.   folic acid 1 MG tablet Commonly known as:  FOLVITE Take 1 tablet (1 mg total) daily by mouth.   furosemide 40 MG tablet Commonly known as:   LASIX Take 1 tablet (40 mg total) by mouth daily.   lactulose 10 GM/15ML solution Commonly known as:  CHRONULAC Take 30 mLs (20 g total) by mouth 3 (three) times daily.   midodrine 5 MG tablet Commonly known as:  PROAMATINE Take 1 tablet (5 mg total) by mouth 3 (three) times daily with meals.   pantoprazole 40 MG tablet Commonly known as:  PROTONIX Take 1 tablet (40 mg total) by mouth 2 (two) times daily.   pentoxifylline 400 MG CR tablet Commonly known as:  TRENTAL Take 1 tablet (400 mg total) by mouth 3 (three) times daily with meals.   potassium chloride SA 20 MEQ tablet Commonly known as:  K-DUR,KLOR-CON Take 1 tablet (20 mEq total) by mouth 2 (two) times daily.   rifaximin 550 MG Tabs tablet Commonly known as:  XIFAXAN Take 1 tablet (550 mg total) by mouth 2 (two) times daily.   thiamine 100 MG tablet Take 1 tablet (100 mg total) daily by mouth.        DISCHARGE INSTRUCTIONS:   1. PCP follow-up in 1 week 2. Recommend palliative care follow-up at home  DIET:   Cardiac diet  ACTIVITY:   Activity as tolerated  OXYGEN:   Home Oxygen: No.  Oxygen Delivery: room air  DISCHARGE LOCATION:   home   If you experience worsening of your admission symptoms, develop shortness of breath, life threatening emergency, suicidal or homicidal thoughts you must seek medical attention immediately by calling 911 or calling your MD immediately  if symptoms less severe.  You Must read complete instructions/literature along with all the possible adverse reactions/side effects for all the Medicines you take and that have been prescribed to you. Take any new Medicines after you have completely understood and accpet all the possible adverse reactions/side effects.   Please note  You were cared for by a hospitalist during your hospital stay. If you have any questions about your discharge medications or the care you received while you were in the hospital after you are discharged,  you can call the unit and asked to speak with the hospitalist on call if the hospitalist that took care of you is not available. Once you are discharged, your primary care physician will handle any further medical issues. Please note that NO REFILLS for any discharge medications will be authorized once you are discharged, as it is imperative that you return to your primary care physician (or establish a relationship with a primary care physician if you do not have one) for your aftercare needs so that they can reassess your need for medications and monitor your lab values.    On the day of Discharge:  VITAL SIGNS:   Blood pressure 102/62, pulse 86, temperature 98 F (36.7 C), temperature source Oral, resp. rate 20, height 5\' 10"  (1.778 m), weight 96.3 kg (212 lb 6 oz), SpO2 97 %.  PHYSICAL EXAMINATION:    GENERAL:  66 y.o.-year-old patient lying in the bed with no acute distress. Very jaundiced EYES: Pupils equal, round, reactive to light and accommodation. Icteric sclerae. Extraocular muscles intact.  HEENT: Head atraumatic, normocephalic. Oropharynx and nasopharynx clear.  NECK:  Supple, no jugular venous distention. No thyroid enlargement, no tenderness.  LUNGS: Normal breath sounds bilaterally, no wheezing, rales,rhonchi  or crepitation. No use of accessory muscles of respiration. Decreased bibasilar breath sounds CARDIOVASCULAR: S1, S2 normal. No murmurs, rubs, or gallops.  ABDOMEN: Soft, improved distension, non tender. Bowel sounds present. No organomegaly or mass.  EXTREMITIES: No pedal edema, cyanosis, or clubbing.  NEUROLOGIC: Cranial nerves II through XII are intact. Muscle strength 5/5 in all extremities. Sensation intact. Gait not checked. Global weakness present Feels extremely weak today  PSYCHIATRIC: The patient is alert and oriented x 3.  SKIN: No obvious rash, lesion, or ulcer.     DATA REVIEW:   CBC Recent Labs  Lab 10/06/17 0523  WBC 13.8*  HGB 9.8*  HCT 29.3*    PLT 127*    Chemistries  Recent Labs  Lab 10/08/17 0433 10/09/17 0358  NA 130* 132*  K 3.9 3.5  CL 101 102  CO2 18* 21*  GLUCOSE 128* 131*  BUN 56* 63*  CREATININE UNABLE TO REPORT DUE TO ICTERUS NOT CALCULATED  CALCIUM 8.5* 8.6*  MG  --  3.0*  AST 194*  --   ALT 99*  --   ALKPHOS 130*  --   BILITOT 33.0*  --      Microbiology Results  Results for orders placed or performed during the hospital encounter of 10/04/17  Body fluid culture     Status: None   Collection Time: 10/04/17  7:11 PM  Result Value Ref Range Status   Specimen Description PERITONEAL  Final   Special Requests Normal  Final   Gram Stain   Final    CYTOSPIN SMEAR WBC PRESENT,BOTH PMN AND MONONUCLEAR NO ORGANISMS SEEN    Culture   Final    NO GROWTH 4 DAYS Performed at [facility], [place] [address], [place],  [address]    Report Status 10/08/2017 FINAL  Final  MRSA PCR Screening     Status: None   Collection Time: 10/05/17  5:29 AM  Result Value Ref Range Status   MRSA by PCR NEGATIVE NEGATIVE Final    Comment:        The GeneXpert MRSA Assay (FDA approved for NASAL specimens only), is one component of a comprehensive MRSA colonization surveillance program. It is not intended to diagnose MRSA infection nor to guide or monitor treatment for MRSA infections.     RADIOLOGY:  No results found.   Management plans discussed with the patient, family and they are in agreement.  CODE STATUS:     Code Status Orders  (From admission, onward)        Start     Ordered   10/04/17 2148  Full code  Continuous     10/04/17 2147    Code Status History    Date Active Date Inactive Code Status Order ID Comments User Context   09/27/2017 23:25 10/03/2017 17:02 Full Code [id number]  Gorden Harms, MD Inpatient   09/21/2017 00:50 09/26/2017 19:27 Full Code [id number]  Lance Coon, MD ED   02/11/2016 07:36 02/22/2016 20:28 Full Code [id number]  Loletha Grayer, MD ED      TOTAL  TIME TAKING CARE OF THIS PATIENT: 38 minutes.    Gladstone Lighter M.D on 10/09/2017 at 11:25 AM  Between 7am to 6pm - Pager - [phone]  After 6pm go to [email or web address] - [employer] [facility]  Office  [phone]  CC: Primary care physician; Lucilla Lame, MD   Note: This dictation was prepared with Dragon dictation along with smaller phrase technology. Any transcriptional errors that  result from this process are unintentional.

## 2017-10-16 ENCOUNTER — Ambulatory Visit: Payer: Medicare Other | Admitting: Gastroenterology

## 2017-10-17 ENCOUNTER — Telehealth: Payer: Self-pay | Admitting: Gastroenterology

## 2017-10-17 NOTE — Telephone Encounter (Signed)
Left voice message for patient to call and schedule a 4 week follow up cirrhosis with Dr. Bonna Gains

## 2017-10-18 ENCOUNTER — Encounter: Payer: Self-pay | Admitting: Gastroenterology

## 2017-10-19 ENCOUNTER — Other Ambulatory Visit: Payer: Self-pay | Admitting: *Deleted

## 2017-10-19 NOTE — Patient Outreach (Signed)
[facility]) Care Management  10/19/2017  Craig Maldonado 1950-12-07 [id number]   Spoke with Donnalee Curry, SW at [facility], She reports patient is being followed by Palliative Care at facility and plan is to discharge on Tuesday to home with hospice or to hospice home.   Plan to sign off as no [facility] care management needs at this time.  Royetta Crochet. Laymond Purser, RN, BSN, [place] [phone]) Business Cell  [phone]) Toll Free Office

## 2017-11-20 DEATH — deceased

## 2018-02-21 IMAGING — NM NM GI BLOOD LOSS
4 series · 14 of 14 positions shown · non-contrast
Comparison: CT 02/11/2016.

ADDENDUM:
A delayed image was performed at 6197 hours on 02/16/2016 in
anticipation of a repeat GI bleeding exam due to additional episode
of bleeding today.

Delayed image demonstrates abnormal colonic localization of tracer
extending from the distal ascending colon to the rectum.
Site of tracer extravasation/active bleeding is uncertain.
Despite only approximately 3 percent of injected tracer from
yesterday's exam remaining due to radionuclide decay alone, the
amount of tracer present within the colon may prevent identification
of subtle acute bleeding on a repeat GI bleeding study at this time.
This was discussed with Dr. Fcp.
As patient is stable, will wait and perform repeat radionuclide GI
bleeding study in morning after 2 additional tracer half lives in an
attempt to be better able to localize the site of active bleeding.
CLINICAL DATA: GI bleed.
EXAM:
NUCLEAR MEDICINE GASTROINTESTINAL BLEEDING SCAN
TECHNIQUE: Sequential abdominal images were obtained following intravenous
administration of Lc-PPm labeled red blood cells.
RADIOPHARMACEUTICALS:  18.7 mCi Lc-PPm in-vitro labeled red cells.

[Series 1000: gi bleed 2nd hour · 4.80mm/px · 6 of 250 frames shown]
[frame 21/250]
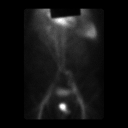
[frame 63/250]
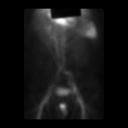
[frame 105/250]
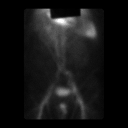
[frame 146/250]
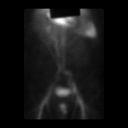
[frame 188/250]
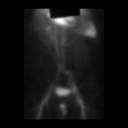
[frame 230/250]
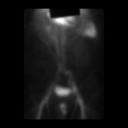

[Series 1000: gi bleed · 4.80mm/px · 6 of 250 frames shown]
[frame 21/250]
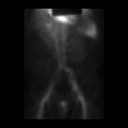
[frame 63/250]
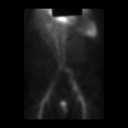
[frame 105/250]
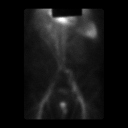
[frame 146/250]
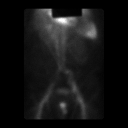
[frame 188/250]
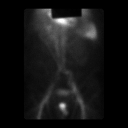
[frame 230/250]
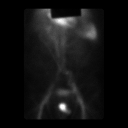

[Series 1001: pre image gi bleed save_screens · non-contrast · 1 of 1 slices shown]
[im 1/1]
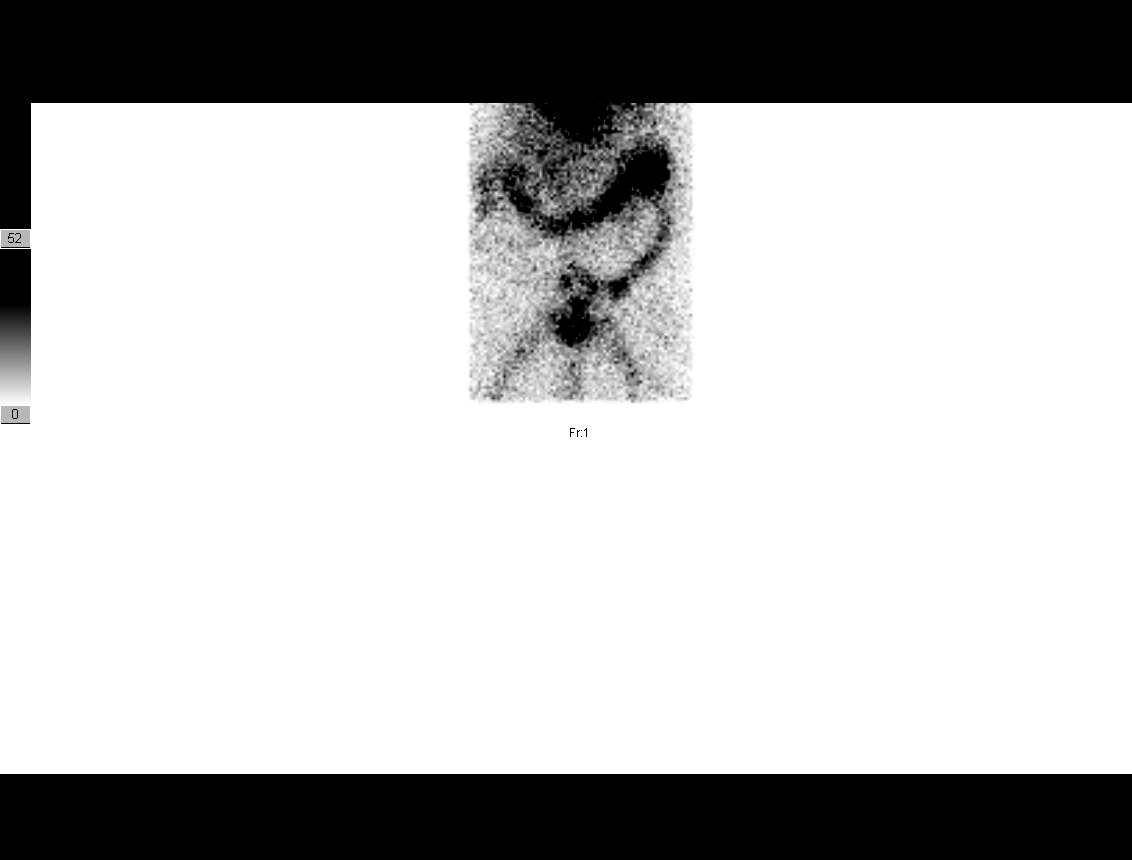

[Series 1002: gi bleed pre image · non-contrast · 4.80mm/px · 1 of 1 slices shown]
[im 1/1  full-range]
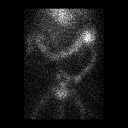

[14 of 14 positions shown; findings below may reference images not displayed]

FINDINGS: No evidence of GI bleed.  Exam appears normal.
IMPRESSION: Normal exam.
